# Patient Record
Sex: Female | Born: 1990 | Hispanic: No | Marital: Married | Smoking: Former smoker
Health system: Southern US, Community
[De-identification: ages and names within clinical notes are randomized; demographics above are authoritative.]

## PROBLEM LIST (undated history)

## (undated) DIAGNOSIS — B977 Papillomavirus as the cause of diseases classified elsewhere: Secondary | ICD-10-CM

## (undated) DIAGNOSIS — Z8669 Personal history of other diseases of the nervous system and sense organs: Secondary | ICD-10-CM

## (undated) DIAGNOSIS — D649 Anemia, unspecified: Secondary | ICD-10-CM

## (undated) DIAGNOSIS — K219 Gastro-esophageal reflux disease without esophagitis: Secondary | ICD-10-CM

## (undated) DIAGNOSIS — T7840XA Allergy, unspecified, initial encounter: Secondary | ICD-10-CM

## (undated) HISTORY — PX: TUBAL LIGATION: SHX77

## (undated) HISTORY — PX: WISDOM TOOTH EXTRACTION: SHX21

## (undated) HISTORY — DX: Allergy, unspecified, initial encounter: T78.40XA

## (undated) HISTORY — PX: TONSILLECTOMY: SUR1361

## (undated) HISTORY — DX: Papillomavirus as the cause of diseases classified elsewhere: B97.7

---
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
## 2010-04-10 ENCOUNTER — Ambulatory Visit: Payer: Self-pay | Admitting: Unknown Physician Specialty

## 2010-04-27 ENCOUNTER — Encounter: Payer: Self-pay | Admitting: Orthopedic Surgery

## 2010-05-10 ENCOUNTER — Encounter: Payer: Self-pay | Admitting: Orthopedic Surgery

## 2010-06-09 ENCOUNTER — Encounter: Payer: Self-pay | Admitting: Orthopedic Surgery

## 2011-08-07 ENCOUNTER — Emergency Department: Payer: Self-pay | Admitting: Emergency Medicine

## 2012-12-08 ENCOUNTER — Ambulatory Visit: Payer: Self-pay | Admitting: Family Medicine

## 2013-11-03 ENCOUNTER — Observation Stay: Payer: Self-pay

## 2013-11-03 LAB — URINALYSIS, COMPLETE
Ph: 5 (ref 4.5–8.0)
RBC,UR: 1 /HPF (ref 0–5)
Specific Gravity: 1.018 (ref 1.003–1.030)
Squamous Epithelial: 1

## 2013-11-06 ENCOUNTER — Inpatient Hospital Stay: Payer: Self-pay | Admitting: Obstetrics & Gynecology

## 2013-11-06 LAB — CBC WITH DIFFERENTIAL/PLATELET
Basophil #: 0.1 10*3/uL (ref 0.0–0.1)
Basophil %: 0.6 %
Eosinophil #: 0.4 10*3/uL (ref 0.0–0.7)
Eosinophil %: 3.2 %
Lymphocyte #: 2.2 10*3/uL (ref 1.0–3.6)
Lymphocyte %: 16 %
MCH: 27.8 pg (ref 26.0–34.0)
MCHC: 32.8 g/dL (ref 32.0–36.0)
MCV: 85 fL (ref 80–100)
Neutrophil #: 10.2 10*3/uL — ABNORMAL HIGH (ref 1.4–6.5)
Neutrophil %: 75.7 %
Platelet: 126 10*3/uL — ABNORMAL LOW (ref 150–440)
RDW: 13.7 % (ref 11.5–14.5)
WBC: 13.5 10*3/uL — ABNORMAL HIGH (ref 3.6–11.0)

## 2013-11-06 LAB — GC/CHLAMYDIA PROBE AMP

## 2014-06-18 NOTE — H&P (Signed)
L&D Evaluation:  History:  HPI Pt is a 24 yo G1P0 at [redacted] weeks GA with an EDC of 11/06/13 who presents to L&D with reports of contractions and possible fluid leaking. She reports feeling like her water had broked at 6am. She put a pad on where it was slightly damp. She reports bloody show during this time. The pt reports that her contractions began getting more intense once this happened. Her prenatal course is significant for BMI >30 with a passing early gtt. She is O-, VI, RI, GBS negative, and recieved her Tdap.   Presents with contractions, leaking fluid   Patient's Medical History No Chronic Illness   Patient's Surgical History tonsillectomy   Medications Pre Natal Vitamins   Allergies PCN, amoxicillin   Social History none   Family History Non-Contributory   ROS:  ROS All systems were reviewed.  HEENT, CNS, GI, GU, Respiratory, CV, Renal and Musculoskeletal systems were found to be normal.   Exam:  Vital Signs stable   General no apparent distress   Mental Status clear   Chest clear   Heart normal sinus rhythm   Abdomen gravid, tender with contractions   Pelvic 5.95.-1--2   Mebranes Intact, bag of water palpated   FHT 140's baseline, no decels, no accels at this time. Pt on monitor for 9 minutes at the time of this writing.   Ucx regular, q2   Skin dry, no lesions, no rashes   Lymph no lymphadenopathy   Impression:  Impression active labor, IUP at 2939w0d   Plan:  Plan EFM/NST, iv pain medication, epidural per pt request, anticipate svd.   Follow Up Appointment need to schedule. in 6 weeks   Electronic Signatures: Jannet MantisSubudhi, Ariaunna Longsworth (CNM)  (Signed 29-Sep-15 12:08)  Authored: L&D Evaluation   Last Updated: 29-Sep-15 12:08 by Jannet MantisSubudhi, Lakeisha Waldrop (CNM)

## 2014-06-18 NOTE — H&P (Signed)
L&D Evaluation:  History Expanded:  HPI 24 yo G1 with EDD of 11/06/13 presents with c/o constant back pain and abdominal cramping. Denies LOF, VB or decreased FM. [facility] to [facility]. RH negative - Rhogam given at 28 wks. BMI >30, early 1 hr normal.   Blood Type (Maternal) O negative   Group B Strep Results Maternal (Result >5wks must be treated as unknown) negative   Maternal HIV Negative   Maternal Syphilis Ab Nonreactive   Maternal Varicella Immune   Rubella Results (Maternal) immune   Presents with back pain, contractions   Patient's Medical History No Chronic Illness   Patient's Surgical History tonsillectomy   Medications Pre Natal Vitamins   Allergies PCN, Amoxicillin   Social History none   Exam:  Vital Signs stable   General no apparent distress   Mental Status clear   Abdomen gravid, tender with contractions   Pelvic no external lesions, 2/80/-1, no change in 1 hour   Mebranes Intact   FHT normal rate with no decels, category 1 tracing - baseline 140, moderate variability, + accels, no decels, unchanged x >1 hour   Ucx irregular   Impression:  Impression IUP at 7451w4d, not in labor   Plan:  Plan discharge   Comments Labor precautions and comfort measures   Follow Up Appointment already scheduled. 9/28   Electronic Signatures: Marta AntuBrothers, Akaisha Truman K (CNM)  (Signed 26-Sep-15 17:04)  Authored: L&D Evaluation   Last Updated: 26-Sep-15 17:04 by Vella KohlerBrothers, Armand Preast K (CNM)

## 2014-07-11 ENCOUNTER — Ambulatory Visit (INDEPENDENT_AMBULATORY_CARE_PROVIDER_SITE_OTHER): Payer: BLUE CROSS/BLUE SHIELD | Admitting: Podiatry

## 2014-07-11 ENCOUNTER — Encounter: Payer: Self-pay | Admitting: Podiatry

## 2014-07-11 VITALS — Ht 68.0 in | Wt 200.0 lb

## 2014-07-11 DIAGNOSIS — L600 Ingrowing nail: Secondary | ICD-10-CM | POA: Diagnosis not present

## 2014-07-11 NOTE — Progress Notes (Signed)
   Subjective:    Patient ID: Brenda Villegas, female    DOB: [birth year], 24 y.o.   MRN: [id number]  HPI 24 year old female presents the office they with complaints of painful, recurrent ingrown toenails of bilateral big toes. She states that this has been ongoing for several months. She states that it has worsened recently after she recently had a pedicure and she was vague or digging down to deep to the point where the nail started to bleed. Since then she has had pain to the toenails on the outside borders of both nails. She denies any redness, edema or drainage from the area. She said no prior treatment. No other complaints at this time.   Review of Systems  All other systems reviewed and are negative.      Objective:   Physical Exam AAO x3, NAD DP/PT pulses palpable bilaterally, CRT less than 3 seconds Protective sensation intact with Simms Weinstein monofilament, vibratory sensation intact, Achilles tendon reflex intact There is evidence of incurvation of both the left and right lateral nail borders with tenderness palpation overlying the area. There is mild localized edema and trace erythema directly around the nail border. There is no ascending cellulitis, flexes, crepitus, drainage/purulence, malodor, or any other clinical signs of infection at this time. The remaining nails without pain although they do appear to be somewhat incurvated as well. There is no drainage or redness from the nails. No other areas of tenderness to bilateral lower extremities. MMT 5/5, ROM WNL.  No open lesions or pre-ulcerative lesions.  No overlying edema, erythema, increase in warmth to bilateral lower extremities.  No pain with calf compression, swelling, warmth, erythema bilaterally.     Assessment & Plan:  24 year old female with bilateral lateral hallux symptomatic ingrown toenail -Treatment options discussed including all alternatives, risks, and complications At this time, the patient is  requesting partial nail removal with chemical matricectomy to the symptomatic portion of the nail. Risks and complications were discussed with the patient for which they understand and  verbally consent to the procedure. Under sterile conditions a total of 3 mL of a mixture of 2% lidocaine plain and 0.5% Marcaine plain was infiltrated in a hallux block  bilaterally. Once anesthetized, the skin was prepped in sterile fashion. A tourniquet was then applied. Next the lateral  aspect of hallux nail borders were then sharply excised making sure to remove the entire offending nail border. Once the nails were ensured to be removed area was debrided and the underlying skin was intact. There is no purulence identified in the procedure. Next phenol was then applied under standard conditions and copiously irrigated. Silvadene was applied. A dry sterile dressing was applied. After application of the dressing the tourniquet was removed and there is found to be an immediate capillary refill time to the digit. The patient tolerated the procedure well any complications. Post procedure instructions were discussed the patient for which he verbally understood. Follow-up in one week for nail check or sooner if any problems are to arise. Discussed signs/symptoms of infection and directed to call the office immediately should any occur or go directly to the emergency room. In the meantime, encouraged to call the office with any questions, concerns, changes symptoms.

## 2014-07-11 NOTE — Patient Instructions (Signed)

## 2014-07-25 ENCOUNTER — Ambulatory Visit (INDEPENDENT_AMBULATORY_CARE_PROVIDER_SITE_OTHER): Payer: BLUE CROSS/BLUE SHIELD | Admitting: Podiatry

## 2014-07-25 ENCOUNTER — Telehealth: Payer: Self-pay | Admitting: Physician Assistant

## 2014-07-25 DIAGNOSIS — Z9889 Other specified postprocedural states: Secondary | ICD-10-CM

## 2014-07-25 DIAGNOSIS — L600 Ingrowing nail: Secondary | ICD-10-CM | POA: Diagnosis not present

## 2014-07-25 NOTE — Telephone Encounter (Signed)
Pt called to establish with you.  I have scheduled an appointment.  BCBS.Lurlean Nanny

## 2014-07-26 NOTE — Progress Notes (Signed)
Patient ID: Brenda Villegas, female   DOB: [birth year], 24 y.o.   MRN: [id number]  Subjective: 24 year old female presents the office today for follow up evaluation of bilateral lateral hallux partial nail avulsion with chemical matricectomy. She states that she is doing great and no longer has any pain to the toenails. She been soaking her foot and Epsom salts soaks twice a day covering with antibiotic ointment and a Band-Aid. He denies any purulence or any drainage. Denies any pain to the area. Denies any red streaks. Denies any systemic complaints as fevers, chills, nausea, vomiting. No other complaints at this time in no acute changes since last appointment.  Objective: AAO 3, NAD Neurovascular status unchanged. Status post bilateral lateral hallux partial nail avulsion which is healing well. There is scab formation within the procedure site. There is no tenderness to palpation overlying the area. There is no surrounding erythema, ascending cellulitis, fluctuance, crepitus, drainage/purulence, malodor.  No other areas of tenderness to bilateral lower extremities. No overlying edema, erythema, increased warmth. No open lesions or pre-ulcer lesions identified bilaterally. No pain with calf compression, swelling, warmth, erythema.  Assessment: 24 year old female status post bilateral lateral hallux partial nail avulsions with chemical matricectomy is.  Plan: Recommended continue soaking in Epson salt soaks twice a day covering with antibiotic ointment and a Band-Aid of the day. Can leave the area uncovered at night. Continues to the area has completely healed. Follow-up in 2 weeks if there is not completely healed or sooner if any problems are to arise. Continue to monitor for any clinical signs or symptoms of infection and directed to call the office immediately should any occur. In the meantime call any questions, concerns, or any changes symptoms.

## 2014-08-02 ENCOUNTER — Encounter: Payer: Self-pay | Admitting: Physician Assistant

## 2014-08-02 ENCOUNTER — Ambulatory Visit (INDEPENDENT_AMBULATORY_CARE_PROVIDER_SITE_OTHER): Payer: BLUE CROSS/BLUE SHIELD | Admitting: Physician Assistant

## 2014-08-02 VITALS — BP 112/70 | HR 80 | Temp 98.8°F | Resp 16 | Ht 67.0 in | Wt 220.0 lb

## 2014-08-02 DIAGNOSIS — E0590 Thyrotoxicosis, unspecified without thyrotoxic crisis or storm: Secondary | ICD-10-CM | POA: Diagnosis not present

## 2014-08-02 DIAGNOSIS — Z1331 Encounter for screening for depression: Secondary | ICD-10-CM

## 2014-08-02 DIAGNOSIS — F329 Major depressive disorder, single episode, unspecified: Secondary | ICD-10-CM | POA: Diagnosis not present

## 2014-08-02 DIAGNOSIS — Z1389 Encounter for screening for other disorder: Secondary | ICD-10-CM | POA: Diagnosis not present

## 2014-08-02 DIAGNOSIS — Z0000 Encounter for general adult medical examination without abnormal findings: Secondary | ICD-10-CM | POA: Diagnosis not present

## 2014-08-02 DIAGNOSIS — R5383 Other fatigue: Secondary | ICD-10-CM

## 2014-08-02 DIAGNOSIS — R635 Abnormal weight gain: Secondary | ICD-10-CM

## 2014-08-02 DIAGNOSIS — F32A Depression, unspecified: Secondary | ICD-10-CM

## 2014-08-02 MED ORDER — BUPROPION HCL ER (SR) 100 MG PO TB12: 100.0000 mg | ORAL_TABLET | Freq: Two times a day (BID) | ORAL | Status: DC

## 2014-08-02 NOTE — Patient Instructions (Signed)
American Heart Association (AHA) Exercise Recommendation  Being physically active is important to prevent heart disease and stroke, the nation's No. 1and No. 5killers. To improve overall cardiovascular health, we suggest at least 150 minutes per week of moderate exercise or 75 minutes per week of vigorous exercise (or a combination of moderate and vigorous activity). Thirty minutes a day, five times a week is an easy goal to remember. You will also experience benefits even if you divide your time into two or three segments of 10 to 15 minutes per day.  For people who would benefit from lowering their blood pressure or cholesterol, we recommend 40 minutes of aerobic exercise of moderate to vigorous intensity three to four times a week to lower the risk for heart attack and stroke.  Physical activity is anything that makes you move your body and burn calories.  This includes things like climbing stairs or playing sports. Aerobic exercises benefit your heart, and include walking, jogging, swimming or biking. Strength and stretching exercises are best for overall stamina and flexibility.  The simplest, positive change you can make to effectively improve your heart health is to start walking. It's enjoyable, free, easy, social and great exercise. A walking program is flexible and boasts high success rates because people can stick with it. It's easy for walking to become a regular and satisfying part of life.   For Overall Cardiovascular Health:  At least 30 minutes of moderate-intensity aerobic activity at least 5 days per week for a total of 150  OR   At least 25 minutes of vigorous aerobic activity at least 3 days per week for a total of 75 minutes; or a combination of moderate- and vigorous-intensity aerobic activity  AND   Moderate- to high-intensity muscle-strengthening activity at least 2 days per week for additional health benefits.  For Lowering Blood Pressure and Cholesterol  An  average 40 minutes of moderate- to vigorous-intensity aerobic activity 3 or 4 times per week  What if I can't make it to the time goal? Something is always better than nothing! And everyone has to start somewhere. Even if you've been sedentary for years, today is the day you can begin to make healthy changes in your life. If you don't think you'll make it for 30 or 40 minutes, set a reachable goal for today. You can work up toward your overall goal by increasing your time as you get stronger. Don't let all-or-nothing thinking rob you of doing what you can every day.  Source:http://www.heart.org      Why follow it? Research shows. . Those who follow the Mediterranean diet have a reduced risk of heart disease  . The diet is associated with a reduced incidence of Parkinson's and Alzheimer's diseases . People following the diet may have longer life expectancies and lower rates of chronic diseases  . The Dietary Guidelines for Americans recommends the Mediterranean diet as an eating plan to promote health and prevent disease  What Is the Mediterranean Diet?  . Healthy eating plan based on typical foods and recipes of Mediterranean-style cooking . The diet is primarily a plant based diet; these foods should make up a majority of meals   Starches - Plant based foods should make up a majority of meals - They are an important sources of vitamins, minerals, energy, antioxidants, and fiber - Choose whole grains, foods high in fiber and minimally processed items  - Typical grain sources include wheat, oats, barley, corn, brown rice, bulgar, farro, millet, polenta,  couscous  - Various types of beans include chickpeas, lentils, fava beans, black beans, white beans   Fruits  Veggies - Large quantities of antioxidant rich fruits & veggies; 6 or more servings  - Vegetables can be eaten raw or lightly drizzled with oil and cooked  - Vegetables common to the traditional Mediterranean Diet include: artichokes,  arugula, beets, broccoli, brussel sprouts, cabbage, carrots, celery, collard greens, cucumbers, eggplant, kale, leeks, lemons, lettuce, mushrooms, okra, onions, peas, peppers, potatoes, pumpkin, radishes, rutabaga, shallots, spinach, sweet potatoes, turnips, zucchini - Fruits common to the Mediterranean Diet include: apples, apricots, avocados, cherries, clementines, dates, figs, grapefruits, grapes, melons, nectarines, oranges, peaches, pears, pomegranates, strawberries, tangerines  Fats - Replace butter and margarine with healthy oils, such as olive oil, canola oil, and tahini  - Limit nuts to no more than a handful a day  - Nuts include walnuts, almonds, pecans, pistachios, pine nuts  - Limit or avoid candied, honey roasted or heavily salted nuts - Olives are central to the [employer] - can be eaten whole or used in a variety of dishes   Meats Protein - Limiting red meat: no more than a few times a month - When eating red meat: choose lean cuts and keep the portion to the size of deck of cards - Eggs: approx. 0 to 4 times a week  - Fish and lean poultry: at least 2 a week  - Healthy protein sources include, chicken, [place], lean beef, lamb - Increase intake of seafood such as tuna, salmon, trout, mackerel, shrimp, scallops - Avoid or limit high fat processed meats such as sausage and bacon  Dairy - Include moderate amounts of low fat dairy products  - Focus on healthy dairy such as fat free yogurt, skim milk, low or reduced fat cheese - Limit dairy products higher in fat such as whole or 2% milk, cheese, ice cream  Alcohol - Moderate amounts of red wine is ok  - No more than 5 oz daily for women (all ages) and men older than age 22  - No more than 10 oz of wine daily for men younger than 52  Other - Limit sweets and other desserts  - Use herbs and spices instead of salt to flavor foods  - Herbs and spices common to the traditional Mediterranean Diet include: basil, bay leaves,  chives, cloves, cumin, fennel, garlic, lavender, marjoram, mint, oregano, parsley, pepper, rosemary, sage, savory, sumac, tarragon, thyme   It's not just a diet, it's a lifestyle:  . The Mediterranean diet includes lifestyle factors typical of those in the region  . Foods, drinks and meals are best eaten with others and savored . Daily physical activity is important for overall good health . This could be strenuous exercise like running and aerobics . This could also be more leisurely activities such as walking, housework, yard-work, or taking the stairs . Moderation is the key; a balanced and healthy diet accommodates most foods and drinks . Consider portion sizes and frequency of consumption of certain foods   Meal Ideas & Options:  . Breakfast:  o Whole wheat toast or whole wheat English muffins with peanut butter & hard boiled egg o Steel cut oats topped with apples & cinnamon and skim milk  o Fresh fruit: banana, strawberries, melon, berries, peaches  o Smoothies: strawberries, bananas, greek yogurt, peanut butter o Low fat greek yogurt with blueberries and granola  o Egg white omelet with spinach and mushrooms o Breakfast couscous: whole wheat  couscous, apricots, skim milk, cranberries  . Sandwiches:  o Hummus and grilled vegetables (peppers, zucchini, squash) on whole wheat bread   o Grilled chicken on whole wheat pita with lettuce, tomatoes, cucumbers or tzatziki  o Tuna salad on whole wheat bread: tuna salad made with greek yogurt, olives, red peppers, capers, green onions o Garlic rosemary lamb pita: lamb sauted with garlic, rosemary, salt & pepper; add lettuce, cucumber, greek yogurt to pita - flavor with lemon juice and black pepper  . Seafood:  o Mediterranean grilled salmon, seasoned with garlic, basil, parsley, lemon juice and black pepper o Shrimp, lemon, and spinach whole-grain pasta salad made with low fat greek yogurt  o Seared scallops with lemon orzo  o Seared tuna  steaks seasoned salt, pepper, coriander topped with tomato mixture of olives, tomatoes, olive oil, minced garlic, parsley, green onions and cappers  . Meats:  o Herbed greek chicken salad with kalamata olives, cucumber, feta  o Red bell peppers stuffed with spinach, bulgur, lean ground beef (or lentils) & topped with feta   o Kebabs: skewers of chicken, tomatoes, onions, zucchini, squash  o [place] burgers: made with red onions, mint, dill, lemon juice, feta cheese topped with roasted red peppers . Vegetarian o Cucumber salad: cucumbers, artichoke hearts, celery, red onion, feta cheese, tossed in olive oil & lemon juice  o Hummus and whole grain pita points with a greek salad (lettuce, tomato, feta, olives, cucumbers, red onion) o Lentil soup with celery, carrots made with vegetable broth, garlic, salt and pepper  o Tabouli salad: parsley, bulgur, mint, scallions, cucumbers, tomato, radishes, lemon juice, olive oil, salt and pepper.

## 2014-08-02 NOTE — Progress Notes (Signed)
Subjective:     Patient ID: Brenda Villegas, female   DOB: [birth year], 24 y.o.   MRN: [id number]  Thyroid Problem Presents for initial visit. Symptoms include fatigue. Patient reports no anxiety, diaphoresis, hair loss, leg swelling, menstrual problem or palpitations. Risk factors include family history of hyperthyroidism.  Patient comes in today wanting to get labs checked to see if she has a thryoid issue. She reports that she has a family history and wanted to check to be sure.   Depression: States she has had feelings of depressed mood, irritability towards her husband, and feelings of being a failure since the birth of her son about 9 months ago.  She denies any feelings of hurting herself or the baby or suicidal or homicidal ideations.  She has had episodes of frequent crying and fatigue.  She is currently not taking any medications for depression.  Weight Gain: Has had approx 60 lb weight gain since becoming pregnant.  She is 9 months post partum and has not lost any of the "baby weight."  She does admit to eating a lot and more often than when she even feels like she is not  She will still eat.   Annual: She is not exercising.  She does state that she is sleeping ok, but awakes easily.  She most recently had a pap smear in November prior to her pregnancy.  She has also recently underwent a pelvic exam and breast exam by her OB/Gyn when she had her mirena placed.    Review of Systems  Constitutional: Positive for activity change and fatigue. Negative for fever, chills, diaphoresis, appetite change and unexpected weight change.  HENT: Negative.   Eyes: Negative.   Respiratory: Negative.   Cardiovascular: Negative.  Negative for palpitations.  Gastrointestinal: Negative.   Endocrine: Negative.   Genitourinary: Negative.  Negative for menstrual problem.  Musculoskeletal: Negative.   Skin: Negative.   Allergic/Immunologic: Negative.   Neurological: Negative.   Hematological:  Negative.   Psychiatric/Behavioral: Positive for dysphoric mood and agitation. Negative for suicidal ideas, hallucinations, behavioral problems, confusion, sleep disturbance, self-injury and decreased concentration. The patient is not nervous/anxious and is not hyperactive.        Crying frequently       Objective:   Physical Exam  Constitutional: She is oriented to person, place, and time. She appears well-developed and well-nourished. No distress.  HENT:  Head: Normocephalic and atraumatic.  Right Ear: Hearing, tympanic membrane, external ear and ear canal normal.  Left Ear: Hearing, tympanic membrane, external ear and ear canal normal.  Nose: Nose normal.  Mouth/Throat: Uvula is midline, oropharynx is clear and moist and mucous membranes are normal. No oropharyngeal exudate.  Eyes: Conjunctivae and EOM are normal. Pupils are equal, round, and reactive to light. Right eye exhibits no discharge. Left eye exhibits no discharge. No scleral icterus.  Neck: Normal range of motion. Neck supple. No tracheal deviation present. No thyromegaly present.  Cardiovascular: Normal rate, regular rhythm and normal heart sounds.  Exam reveals no gallop and no friction rub.   No murmur heard. Pulmonary/Chest: Effort normal and breath sounds normal. No respiratory distress. She has no wheezes. She has no rales. She exhibits no tenderness.  Abdominal: Soft. Bowel sounds are normal. She exhibits no distension and no mass. There is no tenderness. There is no rebound and no guarding.  Genitourinary:  Deferred-done by Ob/Gyn when Mirena IUD was placed.  Musculoskeletal: Normal range of motion. She exhibits no edema or tenderness.  Lymphadenopathy:  She has no cervical adenopathy.  Neurological: She is alert and oriented to person, place, and time. No cranial nerve deficit.  Skin: Skin is warm and dry. She is not diaphoretic.  Psychiatric: She has a normal mood and affect. Her behavior is normal. Judgment and  thought content normal.  Vitals reviewed.      Assessment:     1. Annual physical exam   2. Other fatigue   3. Weight gain   4. Depression screening   5. Depression    Plan:     1. Annual physical exam Will check labs.  F/U pending lab results. - CBC w/Diff - Lipid Profile - Comprehensive Metabolic Panel (CMET)  2. Other fatigue Will check thyroid panel since she has strong family history of early onset hypothyroidism.  Will f/u pending lab results. - T4, free - T3, free - TSH  3. Weight gain Discussed weight loss with starting a food diary, limiting calorie intake to 1200-1500 calories per day and adding daily exercise for 3-4 days per week for 30-40 minutes each day. - T4, free - T3, free - TSH  4. Depression screening Scored 12 on PHQ9.  5. Depression Will try wellbutrin as her mother as tried this and it worked well.  It also has the lowest weight gain side effects and possibly a weight loss benefit for her.  Will recheck in 4 weeks to see how the medication is working at that time. - buPROPion (WELLBUTRIN SR) 100 MG 12 hr tablet; Take 1 tablet (100 mg total) by mouth 2 (two) times daily.  Dispense: 30 tablet; Refill: 1

## 2014-08-03 LAB — LIPID PANEL
Cholesterol, Total: 199 mg/dL (ref 100–199)
HDL: 38 mg/dL — ABNORMAL LOW (ref >39)
Triglycerides: 117 mg/dL (ref 0–149)
VLDL Cholesterol Cal: 23 mg/dL (ref 5–40)

## 2014-08-03 LAB — COMPREHENSIVE METABOLIC PANEL
AST: 22 IU/L (ref 0–40)
Alkaline Phosphatase: 129 IU/L — ABNORMAL HIGH (ref 39–117)
BUN/Creatinine Ratio: 14 (ref 8–20)
Bilirubin Total: 0.5 mg/dL (ref 0.0–1.2)
Chloride: 99 mmol/L (ref 97–108)
GFR calc non Af Amer: 137 mL/min/{1.73_m2} (ref >59)
Globulin, Total: 3 g/dL (ref 1.5–4.5)
Potassium: 4.1 mmol/L (ref 3.5–5.2)
Total Protein: 7.3 g/dL (ref 6.0–8.5)

## 2014-08-03 LAB — CBC WITH DIFFERENTIAL/PLATELET
EOS (ABSOLUTE): 0.3 10*3/uL (ref 0.0–0.4)
Eos: 3 %
Hematocrit: 41.9 % (ref 34.0–46.6)
Lymphocytes Absolute: 3.2 10*3/uL — ABNORMAL HIGH (ref 0.7–3.1)
MCH: 26.9 pg (ref 26.6–33.0)
MCHC: 33.4 g/dL (ref 31.5–35.7)
MCV: 81 fL (ref 79–97)
Monocytes Absolute: 0.7 10*3/uL (ref 0.1–0.9)
Neutrophils: 66 %
RBC: 5.2 x10E6/uL (ref 3.77–5.28)
RDW: 13.5 % (ref 12.3–15.4)
WBC: 12 10*3/uL — ABNORMAL HIGH (ref 3.4–10.8)

## 2014-08-03 LAB — T4, FREE: Free T4: 1.87 ng/dL — ABNORMAL HIGH (ref 0.82–1.77)

## 2014-08-03 LAB — TSH: TSH: 0.005 u[IU]/mL — ABNORMAL LOW (ref 0.450–4.500)

## 2014-08-07 ENCOUNTER — Telehealth: Payer: Self-pay

## 2014-08-07 ENCOUNTER — Encounter: Payer: Self-pay | Admitting: Physician Assistant

## 2014-08-07 DIAGNOSIS — E039 Hypothyroidism, unspecified: Secondary | ICD-10-CM | POA: Insufficient documentation

## 2014-08-07 DIAGNOSIS — E0590 Thyrotoxicosis, unspecified without thyrotoxic crisis or storm: Secondary | ICD-10-CM

## 2014-08-07 HISTORY — DX: Thyrotoxicosis, unspecified without thyrotoxic crisis or storm: E05.90

## 2014-08-07 NOTE — Telephone Encounter (Signed)
Pt is returning call.  CB#[phone]/[phone]

## 2014-08-07 NOTE — Addendum Note (Signed)
Addended by: Margaretann LovelessBURNETTE, JENNIFER M on: 08/07/2014 08:25 AM   Modules accepted: Orders

## 2014-08-07 NOTE — Telephone Encounter (Signed)
LMTCB

## 2014-08-07 NOTE — Telephone Encounter (Signed)
-----   Message from Margaretann LovelessJennifer M Burnette, [place]PA-C sent at 08/07/2014  8:28 AM EDT ----- Please notify patient that her labs indicate a possible thyroiditis and hyperthyroid.  I would like to check some more blood work including thyroid antibodies and get a thyroid ultrasound to rule out any nodule that could be causing the hyperthyroidism (orders have been placed) After the ultrasound I will see her back to discuss results and see if referral may be necessary.  Thanks! -JB

## 2014-08-08 NOTE — Telephone Encounter (Signed)
Patient advised as directed below. Patient verbalized understanding and agrees with treatment plan. Patient states she has a appointment for U/S on 08/09/14.

## 2014-08-09 ENCOUNTER — Ambulatory Visit
Admission: RE | Admit: 2014-08-09 | Discharge: 2014-08-09 | Disposition: A | Discharge status: Home / Self Care | Payer: BLUE CROSS/BLUE SHIELD | Source: Ambulatory Visit | Attending: Physician Assistant | Admitting: Physician Assistant

## 2014-08-09 DIAGNOSIS — E0590 Thyrotoxicosis, unspecified without thyrotoxic crisis or storm: Secondary | ICD-10-CM | POA: Insufficient documentation

## 2014-08-13 ENCOUNTER — Telehealth: Payer: Self-pay

## 2014-08-13 NOTE — Telephone Encounter (Signed)
Patient advised as directed below. [facility]. Patient has a follow up appointment scheduled for 08/30/14.

## 2014-08-13 NOTE — Telephone Encounter (Signed)
-----   Message from Margaretann LovelessJennifer M Burnette, [place]PA-C sent at 08/09/2014  5:13 PM EDT ----- No nodules seen, just slightly enlarged thyroid.  Still awaiting antibody labs, but go ahead and set f/u appt with me if not already made.  Thanks! -JB

## 2014-08-14 ENCOUNTER — Telehealth: Payer: Self-pay

## 2014-08-14 LAB — THYROID ANTIBODIES: Thyroglobulin Antibody: 1.5 IU/mL — ABNORMAL HIGH (ref 0.0–0.9)

## 2014-08-14 NOTE — Telephone Encounter (Signed)
LMTCB

## 2014-08-14 NOTE — Telephone Encounter (Signed)
-----   Message from Margaretann LovelessJennifer M Burnette, PA-C sent at 08/14/2014 11:30 AM EDT ----- Thyroid antibodies are elevated.  Recommend referral to endocrinology for further evaluation.  Order for referral has been placed.  Thanks! -JB

## 2014-08-14 NOTE — Addendum Note (Signed)
Addended by: Margaretann LovelessBURNETTE, JENNIFER M on: 08/14/2014 11:30 AM   Modules accepted: Orders

## 2014-08-14 NOTE — Telephone Encounter (Signed)
Patient advised as directed below. Patient verbalized understanding and agrees to proceed with referral.

## 2014-08-30 ENCOUNTER — Ambulatory Visit (INDEPENDENT_AMBULATORY_CARE_PROVIDER_SITE_OTHER): Payer: BLUE CROSS/BLUE SHIELD | Admitting: Physician Assistant

## 2014-08-30 ENCOUNTER — Encounter: Payer: Self-pay | Admitting: Physician Assistant

## 2014-08-30 VITALS — BP 108/70 | HR 72 | Temp 98.1°F | Resp 17 | Wt 223.4 lb

## 2014-08-30 DIAGNOSIS — F32A Depression, unspecified: Secondary | ICD-10-CM

## 2014-08-30 DIAGNOSIS — F329 Major depressive disorder, single episode, unspecified: Secondary | ICD-10-CM | POA: Diagnosis not present

## 2014-08-30 DIAGNOSIS — E0590 Thyrotoxicosis, unspecified without thyrotoxic crisis or storm: Secondary | ICD-10-CM

## 2014-08-30 MED ORDER — BUPROPION HCL ER (SR) 100 MG PO TB12: 100.0000 mg | ORAL_TABLET | Freq: Every day | ORAL | Status: DC

## 2014-08-30 NOTE — Patient Instructions (Signed)
American Heart Association (AHA) Exercise Recommendation  Being physically active is important to prevent heart disease and stroke, the nation's No. 1and No. 5killers. To improve overall cardiovascular health, we suggest at least 150 minutes per week of moderate exercise or 75 minutes per week of vigorous exercise (or a combination of moderate and vigorous activity). Thirty minutes a day, five times a week is an easy goal to remember. You will also experience benefits even if you divide your time into two or three segments of 10 to 15 minutes per day.  For people who would benefit from lowering their blood pressure or cholesterol, we recommend 40 minutes of aerobic exercise of moderate to vigorous intensity three to four times a week to lower the risk for heart attack and stroke.  Physical activity is anything that makes you move your body and burn calories.  This includes things like climbing stairs or playing sports. Aerobic exercises benefit your heart, and include walking, jogging, swimming or biking. Strength and stretching exercises are best for overall stamina and flexibility.  The simplest, positive change you can make to effectively improve your heart health is to start walking. It's enjoyable, free, easy, social and great exercise. A walking program is flexible and boasts high success rates because people can stick with it. It's easy for walking to become a regular and satisfying part of life.   For Overall Cardiovascular Health:  At least 30 minutes of moderate-intensity aerobic activity at least 5 days per week for a total of 150  OR   At least 25 minutes of vigorous aerobic activity at least 3 days per week for a total of 75 minutes; or a combination of moderate- and vigorous-intensity aerobic activity  AND   Moderate- to high-intensity muscle-strengthening activity at least 2 days per week for additional health benefits.  For Lowering Blood Pressure and Cholesterol  An  average 40 minutes of moderate- to vigorous-intensity aerobic activity 3 or 4 times per week  What if I can't make it to the time goal? Something is always better than nothing! And everyone has to start somewhere. Even if you've been sedentary for years, today is the day you can begin to make healthy changes in your life. If you don't think you'll make it for 30 or 40 minutes, set a reachable goal for today. You can work up toward your overall goal by increasing your time as you get stronger. Don't let all-or-nothing thinking rob you of doing what you can every day.  Source:http://www.heart.org      Why follow it? Research shows. . Those who follow the Mediterranean diet have a reduced risk of heart disease  . The diet is associated with a reduced incidence of Parkinson's and Alzheimer's diseases . People following the diet may have longer life expectancies and lower rates of chronic diseases  . The Dietary Guidelines for Americans recommends the Mediterranean diet as an eating plan to promote health and prevent disease  What Is the Mediterranean Diet?  . Healthy eating plan based on typical foods and recipes of Mediterranean-style cooking . The diet is primarily a plant based diet; these foods should make up a majority of meals   Starches - Plant based foods should make up a majority of meals - They are an important sources of vitamins, minerals, energy, antioxidants, and fiber - Choose whole grains, foods high in fiber and minimally processed items  - Typical grain sources include wheat, oats, barley, corn, brown rice, bulgar, farro, millet, polenta,  couscous  - Various types of beans include chickpeas, lentils, fava beans, black beans, white beans   Fruits  Veggies - Large quantities of antioxidant rich fruits & veggies; 6 or more servings  - Vegetables can be eaten raw or lightly drizzled with oil and cooked  - Vegetables common to the traditional Mediterranean Diet include: artichokes,  arugula, beets, broccoli, brussel sprouts, cabbage, carrots, celery, collard greens, cucumbers, eggplant, kale, leeks, lemons, lettuce, mushrooms, okra, onions, peas, peppers, potatoes, pumpkin, radishes, rutabaga, shallots, spinach, sweet potatoes, turnips, zucchini - Fruits common to the Mediterranean Diet include: apples, apricots, avocados, cherries, clementines, dates, figs, grapefruits, grapes, melons, nectarines, oranges, peaches, pears, pomegranates, strawberries, tangerines  Fats - Replace butter and margarine with healthy oils, such as olive oil, canola oil, and tahini  - Limit nuts to no more than a handful a day  - Nuts include walnuts, almonds, pecans, pistachios, pine nuts  - Limit or avoid candied, honey roasted or heavily salted nuts - Olives are central to the [employer] - can be eaten whole or used in a variety of dishes   Meats Protein - Limiting red meat: no more than a few times a month - When eating red meat: choose lean cuts and keep the portion to the size of deck of cards - Eggs: approx. 0 to 4 times a week  - Fish and lean poultry: at least 2 a week  - Healthy protein sources include, chicken, [place], lean beef, lamb - Increase intake of seafood such as tuna, salmon, trout, mackerel, shrimp, scallops - Avoid or limit high fat processed meats such as sausage and bacon  Dairy - Include moderate amounts of low fat dairy products  - Focus on healthy dairy such as fat free yogurt, skim milk, low or reduced fat cheese - Limit dairy products higher in fat such as whole or 2% milk, cheese, ice cream  Alcohol - Moderate amounts of red wine is ok  - No more than 5 oz daily for women (all ages) and men older than age 22  - No more than 10 oz of wine daily for men younger than 52  Other - Limit sweets and other desserts  - Use herbs and spices instead of salt to flavor foods  - Herbs and spices common to the traditional Mediterranean Diet include: basil, bay leaves,  chives, cloves, cumin, fennel, garlic, lavender, marjoram, mint, oregano, parsley, pepper, rosemary, sage, savory, sumac, tarragon, thyme   It's not just a diet, it's a lifestyle:  . The Mediterranean diet includes lifestyle factors typical of those in the region  . Foods, drinks and meals are best eaten with others and savored . Daily physical activity is important for overall good health . This could be strenuous exercise like running and aerobics . This could also be more leisurely activities such as walking, housework, yard-work, or taking the stairs . Moderation is the key; a balanced and healthy diet accommodates most foods and drinks . Consider portion sizes and frequency of consumption of certain foods   Meal Ideas & Options:  . Breakfast:  o Whole wheat toast or whole wheat English muffins with peanut butter & hard boiled egg o Steel cut oats topped with apples & cinnamon and skim milk  o Fresh fruit: banana, strawberries, melon, berries, peaches  o Smoothies: strawberries, bananas, greek yogurt, peanut butter o Low fat greek yogurt with blueberries and granola  o Egg white omelet with spinach and mushrooms o Breakfast couscous: whole wheat  couscous, apricots, skim milk, cranberries  . Sandwiches:  o Hummus and grilled vegetables (peppers, zucchini, squash) on whole wheat bread   o Grilled chicken on whole wheat pita with lettuce, tomatoes, cucumbers or tzatziki  o Tuna salad on whole wheat bread: tuna salad made with greek yogurt, olives, red peppers, capers, green onions o Garlic rosemary lamb pita: lamb sauted with garlic, rosemary, salt & pepper; add lettuce, cucumber, greek yogurt to pita - flavor with lemon juice and black pepper  . Seafood:  o Mediterranean grilled salmon, seasoned with garlic, basil, parsley, lemon juice and black pepper o Shrimp, lemon, and spinach whole-grain pasta salad made with low fat greek yogurt  o Seared scallops with lemon orzo  o Seared tuna  steaks seasoned salt, pepper, coriander topped with tomato mixture of olives, tomatoes, olive oil, minced garlic, parsley, green onions and cappers  . Meats:  o Herbed greek chicken salad with kalamata olives, cucumber, feta  o Red bell peppers stuffed with spinach, bulgur, lean ground beef (or lentils) & topped with feta   o Kebabs: skewers of chicken, tomatoes, onions, zucchini, squash  o Turkey burgers: made with red onions, mint, dill, lemon juice, feta cheese topped with roasted red peppers . Vegetarian o Cucumber salad: cucumbers, artichoke hearts, celery, red onion, feta cheese, tossed in olive oil & lemon juice  o Hummus and whole grain pita points with a greek salad (lettuce, tomato, feta, olives, cucumbers, red onion) o Lentil soup with celery, carrots made with vegetable broth, garlic, salt and pepper  o Tabouli salad: parsley, bulgur, mint, scallions, cucumbers, tomato, radishes, lemon juice, olive oil, salt and pepper.      

## 2014-08-30 NOTE — Progress Notes (Signed)
Patient: Brenda Villegas Female    DOB: [birth year]   24 y.o.   MRN: [id number] Visit Date: 08/30/2014  Today's Provider: Margaretann Loveless, PA-C   Chief Complaint  Patient presents with  . Follow-up    Depression; Wellbutrion med   Subjective:    HPI Brenda Villegas is a 24 year old female that returns to the office today to follow-up her depression symptoms that began after the birth of her son. She does feel that she is doing better and has not had as many mood swings as she used to. At previous visit she was also found to have a hyperthyroid and was sent to endocrinology for further evaluation. It was found that she most likely had Graves' disease and is being treated with methimazole for the hyperthyroid at this time. She follows back up with endocrinology at the end of August 2016.    Allergies  Allergen Reactions  . Penicillins Hives   Previous Medications   BUPROPION (WELLBUTRIN SR) 100 MG 12 HR TABLET    Take 1 tablet (100 mg total) by mouth 2 (two) times daily.   METHIMAZOLE (TAPAZOLE) 10 MG TABLET    Take by mouth.    Review of Systems  Constitutional: Negative for fatigue.  Respiratory: Negative for chest tightness and shortness of breath.   Cardiovascular: Negative for chest pain and palpitations.  Neurological: Negative for dizziness, syncope, weakness, light-headedness, numbness and headaches.  Psychiatric/Behavioral: Negative for suicidal ideas, hallucinations, behavioral problems, confusion, sleep disturbance, self-injury, dysphoric mood, decreased concentration and agitation. The patient is not nervous/anxious and is not hyperactive.     History  Substance Use Topics  . Smoking status: Never Smoker   . Smokeless tobacco: Never Used  . Alcohol Use: No   Objective:   BP 108/70 mmHg  Pulse 72  Temp(Src) 98.1 F (36.7 C) (Oral)  Resp 17  Wt 223 lb 6.4 oz (101.334 kg)  Physical Exam  Constitutional: She appears well-developed and  well-nourished. No distress.  Cardiovascular: Normal rate, regular rhythm and normal heart sounds.  Exam reveals no gallop and no friction rub.   No murmur heard. Pulmonary/Chest: Effort normal and breath sounds normal. No respiratory distress. She has no wheezes. She has no rales.  Neurological: She is alert.  Skin: She is not diaphoretic.  Psychiatric: She has a normal mood and affect. Her behavior is normal. Judgment and thought content normal.  Vitals reviewed.       Assessment & Plan:     1. Depression She has been taking only 100 mg Wellbutrin daily and feels that this has been controlling her symptoms well. We will continue at this dose and follow-up in 3 months following her follow-up appointment with endocrinology. - buPROPion (WELLBUTRIN SR) 100 MG 12 hr tablet; Take 1 tablet (100 mg total) by mouth daily.  Dispense: 30 tablet; Refill: 3  2. Hyperthyroidism Lab workup revealed possible Graves' disease with elevated thyroid antibodies. She was seen by endocrinology and started on methimazole. She does feel the methimazole is helping her symptoms. She is to follow-up with endocrinology at the end of August 2016.  She is interested in continuing to lose weight. She has noticed with starting the methimazole that she does not eat as much as she used to. It is with hopes that controlling her hyperthyroidism will decrease her appetite and with the use of the Wellbutrin that she will be able to lose some weight. Information was given on diet and exercise  to help her.        Margaretann Loveless, PA-C  [facility]

## 2014-11-29 ENCOUNTER — Encounter: Payer: Self-pay | Admitting: Physician Assistant

## 2014-11-29 ENCOUNTER — Ambulatory Visit (INDEPENDENT_AMBULATORY_CARE_PROVIDER_SITE_OTHER): Payer: BLUE CROSS/BLUE SHIELD | Admitting: Physician Assistant

## 2014-11-29 VITALS — BP 114/78 | HR 72 | Temp 98.0°F | Resp 16 | Wt 232.0 lb

## 2014-11-29 DIAGNOSIS — Z713 Dietary counseling and surveillance: Secondary | ICD-10-CM | POA: Diagnosis not present

## 2014-11-29 DIAGNOSIS — F329 Major depressive disorder, single episode, unspecified: Secondary | ICD-10-CM

## 2014-11-29 DIAGNOSIS — F32A Depression, unspecified: Secondary | ICD-10-CM

## 2014-11-29 DIAGNOSIS — E0590 Thyrotoxicosis, unspecified without thyrotoxic crisis or storm: Secondary | ICD-10-CM | POA: Diagnosis not present

## 2014-11-29 NOTE — Patient Instructions (Signed)
Calorie Counting for Weight Loss Calories are energy you get from the things you eat and drink. Your body uses this energy to keep you going throughout the day. The number of calories you eat affects your weight. When you eat more calories than your body needs, your body stores the extra calories as fat. When you eat fewer calories than your body needs, your body burns fat to get the energy it needs. Calorie counting means keeping track of how many calories you eat and drink each day. If you make sure to eat fewer calories than your body needs, you should lose weight. In order for calorie counting to work, you will need to eat the number of calories that are right for you in a day to lose a healthy amount of weight per week. A healthy amount of weight to lose per week is usually 1-2 lb (0.5-0.9 kg). A dietitian can determine how many calories you need in a day and give you suggestions on how to reach your calorie goal.  WHAT IS MY MY PLAN? My goal is to have 1200-1500 calories per day.  If I have this many calories per day, I should lose around 1-2 pounds per week. WHAT DO I NEED TO KNOW ABOUT CALORIE COUNTING? In order to meet your daily calorie goal, you will need to:  Find out how many calories are in each food you would like to eat. Try to do this before you eat.  Decide how much of the food you can eat.  Write down what you ate and how many calories it had. Doing this is called keeping a food log. WHERE DO I FIND CALORIE INFORMATION? The number of calories in a food can be found on a Nutrition Facts label. Note that all the information on a label is based on a specific serving of the food. If a food does not have a Nutrition Facts label, try to look up the calories online or ask your dietitian for help. HOW DO I DECIDE HOW MUCH TO EAT? To decide how much of the food you can eat, you will need to consider both the number of calories in one serving and the size of one serving. This information  can be found on the Nutrition Facts label. If a food does not have a Nutrition Facts label, look up the information online or ask your dietitian for help. Remember that calories are listed per serving. If you choose to have more than one serving of a food, you will have to multiply the calories per serving by the amount of servings you plan to eat. For example, the label on a package of bread might say that a serving size is 1 slice and that there are 90 calories in a serving. If you eat 1 slice, you will have eaten 90 calories. If you eat 2 slices, you will have eaten 180 calories. HOW DO I KEEP A FOOD LOG? After each meal, record the following information in your food log:  What you ate.  How much of it you ate.  How many calories it had.  Then, add up your calories. Keep your food log near you, such as in a small notebook in your pocket. Another option is to use a mobile app or website. Some programs will calculate calories for you and show you how many calories you have left each time you add an item to the log. WHAT ARE SOME CALORIE COUNTING TIPS?  Use your calories on foods  and drinks that will fill you up and not leave you hungry. Some examples of this include foods like nuts and nut butters, vegetables, lean proteins, and high-fiber foods (more than 5 g fiber per serving).  Eat nutritious foods and avoid empty calories. Empty calories are calories you get from foods or beverages that do not have many nutrients, such as candy and soda. It is better to have a nutritious high-calorie food (such as an avocado) than a food with few nutrients (such as a bag of chips).  Know how many calories are in the foods you eat most often. This way, you do not have to look up how many calories they have each time you eat them.  Look out for foods that may seem like low-calorie foods but are really high-calorie foods, such as baked goods, soda, and fat-free candy.  Pay attention to calories in drinks.  Drinks such as sodas, specialty coffee drinks, alcohol, and juices have a lot of calories yet do not fill you up. Choose low-calorie drinks like water and diet drinks.  Focus your calorie counting efforts on higher calorie items. Logging the calories in a garden salad that contains only vegetables is less important than calculating the calories in a milk shake.  Find a way of tracking calories that works for you. Get creative. Most people who are successful find ways to keep track of how much they eat in a day, even if they do not count every calorie. WHAT ARE SOME PORTION CONTROL TIPS?  Know how many calories are in a serving. This will help you know how many servings of a certain food you can have.  Use a measuring cup to measure serving sizes. This is helpful when you start out. With time, you will be able to estimate serving sizes for some foods.  Take some time to put servings of different foods on your favorite plates, bowls, and cups so you know what a serving looks like.  Try not to eat straight from a bag or box. Doing this can lead to overeating. Put the amount you would like to eat in a cup or on a plate to make sure you are eating the right portion.  Use smaller plates, glasses, and bowls to prevent overeating. This is a quick and easy way to practice portion control. If your plate is smaller, less food can fit on it.  Try not to multitask while eating, such as watching TV or using your computer. If it is time to eat, sit down at a table and enjoy your food. Doing this will help you to start recognizing when you are full. It will also make you more aware of what and how much you are eating. HOW CAN I CALORIE COUNT WHEN EATING OUT?  Ask for smaller portion sizes or child-sized portions.  Consider sharing an entree and sides instead of getting your own entree.  If you get your own entree, eat only half. Ask for a box at the beginning of your meal and put the rest of your entree in  it so you are not tempted to eat it.  Look for the calories on the menu. If calories are listed, choose the lower calorie options.  Choose dishes that include vegetables, fruits, whole grains, low-fat dairy products, and lean protein. Focusing on smart food choices from each of the 5 food groups can help you stay on track at restaurants.  Choose items that are boiled, broiled, grilled, or steamed.  Choose  water, milk, unsweetened iced tea, or other drinks without added sugars. If you want an alcoholic beverage, choose a lower calorie option. For example, a regular margarita can have up to 700 calories and a glass of wine has around 150.  Stay away from items that are buttered, battered, fried, or served with cream sauce. Items labeled "crispy" are usually fried, unless stated otherwise.  Ask for dressings, sauces, and syrups on the side. These are usually very high in calories, so do not eat much of them.  Watch out for salads. Many people think salads are a healthy option, but this is often not the case. Many salads come with bacon, fried chicken, lots of cheese, fried chips, and dressing. All of these items have a lot of calories. If you want a salad, choose a garden salad and ask for grilled meats or steak. Ask for the dressing on the side, or ask for olive oil and vinegar or lemon to use as dressing.  Estimate how many servings of a food you are given. For example, a serving of cooked rice is  cup or about the size of half a tennis ball or one cupcake wrapper. Knowing serving sizes will help you be aware of how much food you are eating at restaurants. The list below tells you how big or small some common portion sizes are based on everyday objects.  1 oz--4 stacked dice.  3 oz--1 deck of cards.  1 tsp--1 dice.  1 Tbsp-- a Ping-Pong ball.  2 Tbsp--1 Ping-Pong ball.   cup--1 tennis ball or 1 cupcake wrapper.  1 cup--1 baseball.   This information is not intended to replace advice  given to you by your health care provider. Make sure you discuss any questions you have with your health care provider.   Document Released: 01/25/2005 Document Revised: 02/15/2014 Document Reviewed: 11/30/2012 Elsevier Interactive Patient Education [employer]    Why follow it? Research shows. . Those who follow the Mediterranean diet have a reduced risk of heart disease  . The diet is associated with a reduced incidence of Parkinson's and Alzheimer's diseases . People following the diet may have longer life expectancies and lower rates of chronic diseases  . The Dietary Guidelines for Americans recommends the Mediterranean diet as an eating plan to promote health and prevent disease  What Is the Mediterranean Diet?  . Healthy eating plan based on typical foods and recipes of Mediterranean-style cooking . The diet is primarily a plant based diet; these foods should make up a majority of meals   Starches - Plant based foods should make up a majority of meals - They are an important sources of vitamins, minerals, energy, antioxidants, and fiber - Choose whole grains, foods high in fiber and minimally processed items  - Typical grain sources include wheat, oats, barley, corn, brown rice, bulgar, farro, millet, polenta, couscous  - Various types of beans include chickpeas, lentils, fava beans, black beans, white beans   Fruits  Veggies - Large quantities of antioxidant rich fruits & veggies; 6 or more servings  - Vegetables can be eaten raw or lightly drizzled with oil and cooked  - Vegetables common to the traditional Mediterranean Diet include: artichokes, arugula, beets, broccoli, brussel sprouts, cabbage, carrots, celery, collard greens, cucumbers, eggplant, kale, leeks, lemons, lettuce, mushrooms, okra, onions, peas, peppers, potatoes, pumpkin, radishes, rutabaga, shallots, spinach, sweet potatoes, turnips, zucchini - Fruits common to the Mediterranean Diet include: apples, apricots,  avocados, cherries, clementines, dates, figs, grapefruits,  grapes, melons, nectarines, oranges, peaches, pears, pomegranates, strawberries, tangerines  Fats - Replace butter and margarine with healthy oils, such as olive oil, canola oil, and tahini  - Limit nuts to no more than a handful a day  - Nuts include walnuts, almonds, pecans, pistachios, pine nuts  - Limit or avoid candied, honey roasted or heavily salted nuts - Olives are central to the Mediterranean diet - can be eaten whole or used in a variety of dishes   Meats Protein - Limiting red meat: no more than a few times a month - When eating red meat: choose lean cuts and keep the portion to the size of deck of cards - Eggs: approx. 0 to 4 times a week  - Fish and lean poultry: at least 2 a week  - Healthy protein sources include, chicken, [place], lean beef, lamb - Increase intake of seafood such as tuna, salmon, trout, mackerel, shrimp, scallops - Avoid or limit high fat processed meats such as sausage and bacon  Dairy - Include moderate amounts of low fat dairy products  - Focus on healthy dairy such as fat free yogurt, skim milk, low or reduced fat cheese - Limit dairy products higher in fat such as whole or 2% milk, cheese, ice cream  Alcohol - Moderate amounts of red wine is ok  - No more than 5 oz daily for women (all ages) and men older than age 25  - No more than 10 oz of wine daily for men younger than 69  Other - Limit sweets and other desserts  - Use herbs and spices instead of salt to flavor foods  - Herbs and spices common to the traditional Mediterranean Diet include: basil, bay leaves, chives, cloves, cumin, fennel, garlic, lavender, marjoram, mint, oregano, parsley, pepper, rosemary, sage, savory, sumac, tarragon, thyme   It's not just a diet, it's a lifestyle:  . The Mediterranean diet includes lifestyle factors typical of those in the region  . Foods, drinks and meals are best eaten with others and savored . Daily  physical activity is important for overall good health . This could be strenuous exercise like running and aerobics . This could also be more leisurely activities such as walking, housework, yard-work, or taking the stairs . Moderation is the key; a balanced and healthy diet accommodates most foods and drinks . Consider portion sizes and frequency of consumption of certain foods   Meal Ideas & Options:  . Breakfast:  o Whole wheat toast or whole wheat English muffins with peanut butter & hard boiled egg o Steel cut oats topped with apples & cinnamon and skim milk  o Fresh fruit: banana, strawberries, melon, berries, peaches  o Smoothies: strawberries, bananas, greek yogurt, peanut butter o Low fat greek yogurt with blueberries and granola  o Egg white omelet with spinach and mushrooms o Breakfast couscous: whole wheat couscous, apricots, skim milk, cranberries  . Sandwiches:  o Hummus and grilled vegetables (peppers, zucchini, squash) on whole wheat bread   o Grilled chicken on whole wheat pita with lettuce, tomatoes, cucumbers or tzatziki  o Tuna salad on whole wheat bread: tuna salad made with greek yogurt, olives, red peppers, capers, green onions o Garlic rosemary lamb pita: lamb sauted with garlic, rosemary, salt & pepper; add lettuce, cucumber, greek yogurt to pita - flavor with lemon juice and black pepper  . Seafood:  o Mediterranean grilled salmon, seasoned with garlic, basil, parsley, lemon juice and black pepper o Shrimp, lemon, and spinach  whole-grain pasta salad made with low fat greek yogurt  o Seared scallops with lemon orzo  o Seared tuna steaks seasoned salt, pepper, coriander topped with tomato mixture of olives, tomatoes, olive oil, minced garlic, parsley, green onions and cappers  . Meats:  o Herbed greek chicken salad with kalamata olives, cucumber, feta  o Red bell peppers stuffed with spinach, bulgur, lean ground beef (or lentils) & topped with feta   o Kebabs:  skewers of chicken, tomatoes, onions, zucchini, squash  o [place] burgers: made with red onions, mint, dill, lemon juice, feta cheese topped with roasted red peppers . Vegetarian o Cucumber salad: cucumbers, artichoke hearts, celery, red onion, feta cheese, tossed in olive oil & lemon juice  o Hummus and whole grain pita points with a greek salad (lettuce, tomato, feta, olives, cucumbers, red onion) o Lentil soup with celery, carrots made with vegetable broth, garlic, salt and pepper  o Tabouli salad: parsley, bulgur, mint, scallions, cucumbers, tomato, radishes, lemon juice, olive oil, salt and pepper.

## 2014-11-29 NOTE — Progress Notes (Signed)
Patient: Brenda Villegas V Singleton Female    DOB: 03/29/1990   24 y.o.   MRN: [id number] Visit Date: 11/29/2014  Today's Provider: Margaretann LovelessJennifer M Burnette, PA-C   Chief Complaint  Patient presents with  . Depression  . Hyperthyroidism   Subjective:    HPI Depression: Patient is here for her 3 month follow up. She states feeling good. She denies current suicidal and homicidal plan or intent.   Family history significant for depression.Treatment includes Wellbutrin as needed, she has not been taking the medicine since last office visit. She complains of the following side effects from the treatment: none.   Hyperthyroidism: Patient followed up with her Edocrinologist on 09/2014, per patient the specialist stated patient was doing pretty well. Lower the Methimazole to 5 mg a day since then patient has been taking half of the 10 mg.  Weight loss: She states that she is still interested in trying to lose weight. Her and her husband have been trying to follow a healthy diet and increased physical activity. She states that last week her husband did have to undergo surgical removal of his thyroid gland for thyroid cancer and they have not been eating as healthy over the last week and has not been exercising. She states she has gone on small short walks with her son but she cannot go very far with him. They have joined a gym and are going to start following act to the gym once her husband heals from his surgery. She is also tried some natural supplements with a company called [employer] that did help some. She is currently not using anything else for weight loss.  Patient wants to wait on the Influenza Vaccine. Allergies  Allergen Reactions  . Penicillins Hives   Previous Medications   BUPROPION (WELLBUTRIN SR) 100 MG 12 HR TABLET    Take 1 tablet (100 mg total) by mouth daily.   METHIMAZOLE (TAPAZOLE) 10 MG TABLET    Take by mouth.   METHIMAZOLE (TAPAZOLE) 10 MG TABLET        Review of Systems    Constitutional: Negative.   HENT: Positive for rhinorrhea. Negative for congestion, ear discharge, ear pain, sinus pressure, sneezing and sore throat.   Eyes: Negative for discharge, itching and visual disturbance.  Respiratory: Negative for cough, chest tightness, shortness of breath and wheezing.   Cardiovascular: Negative for chest pain, palpitations and leg swelling.  Gastrointestinal: Negative for nausea and vomiting.  Endocrine: Negative.   Psychiatric/Behavioral: Negative for sleep disturbance and decreased concentration. The patient is not nervous/anxious.     Social History  Substance Use Topics  . Smoking status: Never Smoker   . Smokeless tobacco: Never Used  . Alcohol Use: No   Objective:   BP 114/78 mmHg  Pulse 72  Temp(Src) 98 F (36.7 C) (Oral)  Resp 16  Wt 232 lb (105.235 kg)  Physical Exam  Constitutional: She appears well-developed and well-nourished. No distress.  Neck: Normal range of motion. Neck supple. No tracheal deviation present. No thyromegaly present.  Cardiovascular: Normal rate, regular rhythm and normal heart sounds.  Exam reveals no gallop and no friction rub.   No murmur heard. Pulmonary/Chest: Effort normal and breath sounds normal. No respiratory distress. She has no wheezes. She has no rales.  Lymphadenopathy:    She has no cervical adenopathy.  Skin: She is not diaphoretic.  Vitals reviewed.       Assessment & Plan:     1. Encounter  for weight loss counseling Discussed in detail different diet options. We decided that for her and her husband will probably be best to do calorie counting. She will start to keep a food diary to help with her calorie counting. She states that she has done this before and this is what worked well for her. I advised her to stick to a 1200-1500-calorie diet and to try to exercise approximately 30-45 minutes 4-5 times a week. She voiced understanding and will try this. I did give her information about calorie  counting as well as about the Mediterranean diet. We did discuss the Mediterranean diet and how it may be a good option for her and her husband. I did advise her, however, to exchange the fish out for chicken as they do not like to eat fish. I will see her back in 6 months to see how she is doing on her weight loss journey. She may call the office if she would like any further assistance or if she has any acute issues in the meantime.  2. Depression She is doing very well. She has not had to take her Wellbutrin. She feels that her depression was most likely secondary to her thyroid issues. Since she is now stable on the methimazole she is doing well and has not had any depression symptoms. I will follow-up with her in 6 months or sooner if needed.  3. Hyperthyroidism Stable. She is followed by endocrinology. She is currently on methimazole 10 mg and will soon go to methimazole 5 mg. She does follow-up with endocrinology later this year. She could not remember the exact date. I will follow-up with her in 6 months and see how she is doing at that time.  I spent approximately 45 minutes with the patient today. Over 50% of this time was spent with counseling and educating the patient on weight loss, dieting and exercise.        Margaretann Loveless, PA-C  [facility]

## 2015-05-30 ENCOUNTER — Ambulatory Visit: Payer: BLUE CROSS/BLUE SHIELD | Admitting: Physician Assistant

## 2015-05-31 DIAGNOSIS — H5202 Hypermetropia, left eye: Secondary | ICD-10-CM | POA: Diagnosis not present

## 2015-05-31 DIAGNOSIS — H5203 Hypermetropia, bilateral: Secondary | ICD-10-CM | POA: Diagnosis not present

## 2015-05-31 DIAGNOSIS — H5201 Hypermetropia, right eye: Secondary | ICD-10-CM | POA: Diagnosis not present

## 2015-05-31 DIAGNOSIS — H52222 Regular astigmatism, left eye: Secondary | ICD-10-CM | POA: Diagnosis not present

## 2015-06-02 ENCOUNTER — Ambulatory Visit: Payer: BLUE CROSS/BLUE SHIELD | Admitting: Physician Assistant

## 2015-07-15 DIAGNOSIS — Z124 Encounter for screening for malignant neoplasm of cervix: Secondary | ICD-10-CM | POA: Diagnosis not present

## 2015-07-15 DIAGNOSIS — Z309 Encounter for contraceptive management, unspecified: Secondary | ICD-10-CM | POA: Diagnosis not present

## 2015-07-15 DIAGNOSIS — Z01419 Encounter for gynecological examination (general) (routine) without abnormal findings: Secondary | ICD-10-CM | POA: Diagnosis not present

## 2015-07-15 DIAGNOSIS — Z30431 Encounter for routine checking of intrauterine contraceptive device: Secondary | ICD-10-CM | POA: Diagnosis not present

## 2015-09-04 DIAGNOSIS — E0500 Thyrotoxicosis with diffuse goiter without thyrotoxic crisis or storm: Secondary | ICD-10-CM | POA: Diagnosis not present

## 2015-10-06 DIAGNOSIS — Z1239 Encounter for other screening for malignant neoplasm of breast: Secondary | ICD-10-CM | POA: Diagnosis not present

## 2015-10-06 DIAGNOSIS — R2231 Localized swelling, mass and lump, right upper limb: Secondary | ICD-10-CM | POA: Diagnosis not present

## 2015-10-23 DIAGNOSIS — J069 Acute upper respiratory infection, unspecified: Secondary | ICD-10-CM | POA: Diagnosis not present

## 2016-07-20 ENCOUNTER — Ambulatory Visit (INDEPENDENT_AMBULATORY_CARE_PROVIDER_SITE_OTHER): Payer: BLUE CROSS/BLUE SHIELD | Admitting: Obstetrics and Gynecology

## 2016-07-20 ENCOUNTER — Encounter: Payer: Self-pay | Admitting: Obstetrics and Gynecology

## 2016-07-20 VITALS — BP 118/74 | Ht 68.0 in | Wt 224.0 lb

## 2016-07-20 DIAGNOSIS — N3000 Acute cystitis without hematuria: Secondary | ICD-10-CM

## 2016-07-20 DIAGNOSIS — Z124 Encounter for screening for malignant neoplasm of cervix: Secondary | ICD-10-CM

## 2016-07-20 DIAGNOSIS — R3915 Urgency of urination: Secondary | ICD-10-CM

## 2016-07-20 DIAGNOSIS — Z01419 Encounter for gynecological examination (general) (routine) without abnormal findings: Secondary | ICD-10-CM | POA: Diagnosis not present

## 2016-07-20 DIAGNOSIS — Z30431 Encounter for routine checking of intrauterine contraceptive device: Secondary | ICD-10-CM

## 2016-07-20 LAB — POCT URINALYSIS DIPSTICK
Blood, UA: NEGATIVE
Nitrite, UA: NEGATIVE
Protein, UA: NEGATIVE
Spec Grav, UA: <=1.005 (ref 1.010–1.025)
Urobilinogen, UA: NEGATIVE E.U./dL
pH, UA: 6 (ref 5.0–8.0)

## 2016-07-20 MED ORDER — NITROFURANTOIN MONOHYD MACRO 100 MG PO CAPS: 100.0000 mg | ORAL_CAPSULE | Freq: Two times a day (BID) | ORAL | 0 refills | Status: DC

## 2016-07-20 NOTE — Progress Notes (Signed)
Chief Complaint  Patient presents with  . Annual Exam     HPI:      Brenda Villegas is a 26 y.o. G1P1001 who LMP was No LMP recorded. Patient is not currently having periods (Reason: IUD)., presents today for her annual examination.  Her menses are absent due to the IUD.  Dysmenorrhea mild, occurring premenstrually. She does not have intermenstrual bleeding.  Sex activity: single partner, contraception - IUD. Mirena placed 01/07/14 Last Pap: July 15, 2015  Results were: no abnormalities  Hx of STDs: none  There is a FH of breast cancer in her pat aunt and MGGM, genetic testing not indicated. There is no FH of ovarian cancer. The patient does not do self-breast exams.  Tobacco use: The patient denies current or previous tobacco use. Alcohol use: none Exercise: moderately active  She does get adequate calcium and Vitamin D in her diet.  She has noticed urinary frequency/urgency after voiding for the past few wks. She denies dysuria, LBP, belly pain, fevers. She has a hx of UTIs in the past. She drinks 1 caffeinated drink daily.   Past Medical History:  Diagnosis Date  . Allergy   . Hyperthyroidism 08/07/2014    Past Surgical History:  Procedure Laterality Date  . KNEE SURGERY Right 2006  . TONSILLECTOMY     At age 32    Family History  Problem Relation Age of Onset  . Cervical cancer Maternal Aunt   . Thyroid cancer Maternal Aunt   . Breast cancer Paternal Aunt        ? age    Social History   Social History  . Marital status: Married    Spouse name: N/A  . Number of children: N/A  . Years of education: N/A   Occupational History  . Not on file.   Social History Main Topics  . Smoking status: Never Smoker  . Smokeless tobacco: Never Used  . Alcohol use No  . Drug use: No  . Sexual activity: Yes    Birth control/ protection: IUD   Other Topics Concern  . Not on file   Social History Narrative  . No narrative on file     Current Outpatient  Prescriptions:  .  levonorgestrel (MIRENA) 20 MCG/24HR IUD, by Intrauterine route., Disp: , Rfl:  .  methimazole (TAPAZOLE) 10 MG tablet, , Disp: , Rfl: 4 .  nitrofurantoin, macrocrystal-monohydrate, (MACROBID) 100 MG capsule, Take 1 capsule (100 mg total) by mouth 2 (two) times daily., Disp: 14 capsule, Rfl: 0  ROS:  Review of Systems  Constitutional: Negative for fatigue, fever and unexpected weight change.  Respiratory: Negative for cough, shortness of breath and wheezing.   Cardiovascular: Negative for chest pain, palpitations and leg swelling.  Gastrointestinal: Negative for blood in stool, constipation, diarrhea, nausea and vomiting.  Endocrine: Negative for cold intolerance, heat intolerance and polyuria.  Genitourinary: Positive for urgency. Negative for dyspareunia, dysuria, flank pain, frequency, genital sores, hematuria, menstrual problem, pelvic pain, vaginal bleeding, vaginal discharge and vaginal pain.  Musculoskeletal: Negative for back pain, joint swelling and myalgias.  Skin: Negative for rash.  Neurological: Negative for dizziness, syncope, light-headedness, numbness and headaches.  Hematological: Negative for adenopathy.  Psychiatric/Behavioral: Negative for agitation, confusion, sleep disturbance and suicidal ideas. The patient is not nervous/anxious.      Objective: BP 118/74   Ht 5\' 8"  (1.727 m)   Wt 224 lb (101.6 kg)   BMI 34.06 kg/m    Physical Exam  Constitutional: She is oriented to person, place, and time. She appears well-developed and well-nourished.  Genitourinary: Vagina normal and uterus normal. There is no rash or tenderness on the right labia. There is no rash or tenderness on the left labia. No erythema or tenderness in the vagina. No vaginal discharge found. Right adnexum does not display mass and does not display tenderness. Left adnexum does not display mass and does not display tenderness.  Cervix exhibits visible IUD strings. Cervix does not  exhibit motion tenderness or polyp. Uterus is not enlarged or tender.  Neck: Normal range of motion. No thyromegaly present.  Cardiovascular: Normal rate, regular rhythm and normal heart sounds.   No murmur heard. Pulmonary/Chest: Effort normal and breath sounds normal. Right breast exhibits no mass, no nipple discharge, no skin change and no tenderness. Left breast exhibits no mass, no nipple discharge, no skin change and no tenderness.  Abdominal: Soft. There is no tenderness. There is no guarding.  Musculoskeletal: Normal range of motion.  Neurological: She is alert and oriented to person, place, and time. No cranial nerve deficit.  Psychiatric: She has a normal mood and affect. Her behavior is normal.  Vitals reviewed.   Results: Results for orders placed or performed in visit on 07/20/16 (from the past 24 hour(s))  POCT Urinalysis Dipstick     Status: Abnormal   Collection Time: 07/20/16 10:55 AM  Result Value Ref Range   Color, UA yellow    Clarity, UA     Glucose, UA neg    Bilirubin, UA neg    Ketones, UA small    Spec Grav, UA <=1.005 1.010 - 1.025   Blood, UA neg    pH, UA 6.0 5.0 - 8.0   Protein, UA neg    Urobilinogen, UA negative 0.2 or 1.0 E.U./dL   Nitrite, UA neg    Leukocytes, UA Moderate (2+) (A) Negative    Assessment/Plan: Encounter for annual routine gynecological examination  Cervical cancer screening - Plan: IGP, rfx Aptima HPV ASCU  Encounter for routine checking of intrauterine contraceptive device (IUD) - IUD in place. Due for rem 12/2018  Acute cystitis without hematuria - Rx macrobid. Check C&S. F/u prn.  - Plan: nitrofurantoin, macrocrystal-monohydrate, (MACROBID) 100 MG capsule  Urinary urgency - Plan: POCT Urinalysis Dipstick, Urine Culture             GYN counsel adequate intake of calcium and vitamin D, diet and exercise     F/U  Return in about 1 year (around 07/20/2017).  Alicia B. Copland, PA-C 07/20/2016 10:57 AM

## 2016-07-21 LAB — IGP, RFX APTIMA HPV ASCU

## 2016-07-24 LAB — URINE CULTURE

## 2016-08-25 DIAGNOSIS — F4310 Post-traumatic stress disorder, unspecified: Secondary | ICD-10-CM | POA: Diagnosis not present

## 2016-08-25 DIAGNOSIS — F902 Attention-deficit hyperactivity disorder, combined type: Secondary | ICD-10-CM | POA: Diagnosis not present

## 2016-09-15 DIAGNOSIS — F4310 Post-traumatic stress disorder, unspecified: Secondary | ICD-10-CM | POA: Diagnosis not present

## 2016-09-15 DIAGNOSIS — F902 Attention-deficit hyperactivity disorder, combined type: Secondary | ICD-10-CM | POA: Diagnosis not present

## 2016-09-22 DIAGNOSIS — F4310 Post-traumatic stress disorder, unspecified: Secondary | ICD-10-CM | POA: Diagnosis not present

## 2016-09-22 DIAGNOSIS — F902 Attention-deficit hyperactivity disorder, combined type: Secondary | ICD-10-CM | POA: Diagnosis not present

## 2016-09-23 ENCOUNTER — Encounter: Payer: Self-pay | Admitting: Obstetrics and Gynecology

## 2016-09-23 ENCOUNTER — Ambulatory Visit (INDEPENDENT_AMBULATORY_CARE_PROVIDER_SITE_OTHER): Payer: BLUE CROSS/BLUE SHIELD | Admitting: Obstetrics and Gynecology

## 2016-09-23 VITALS — BP 120/80 | HR 61 | Ht 68.0 in | Wt 209.0 lb

## 2016-09-23 DIAGNOSIS — Z30432 Encounter for removal of intrauterine contraceptive device: Secondary | ICD-10-CM

## 2016-09-23 DIAGNOSIS — Z30011 Encounter for initial prescription of contraceptive pills: Secondary | ICD-10-CM

## 2016-09-23 MED ORDER — DESOGESTREL-ETHINYL ESTRADIOL 0.15-30 MG-MCG PO TABS
1.0000 | ORAL_TABLET | Freq: Every day | ORAL | 10 refills | Status: DC
Start: 2016-09-23 — End: 2017-10-06

## 2016-09-23 NOTE — Progress Notes (Signed)
   Chief Complaint  Patient presents with  . Contraception    removal     History of Present Illness:  Brenda Villegas is a 26 y.o. that had a Mirena IUD placed approximately 3 years ago. Since that time, she has done really well, denies dyspareunia, pelvic pain, non-menstrual bleeding, vaginal d/c, heavy bleeding. She did have some extra discomfort this past month, however, and she just wants to go back to OCPs. She did apri in the past without problems.   Last annual 6/18.   BP 120/80   Pulse 61   Ht 5\' 8"  (1.727 m)   Wt 209 lb (94.8 kg)   BMI 31.78 kg/m   Pelvic exam:  Two IUD strings present seen coming from the cervical os. EGBUS, vaginal vault and cervix: within normal limits  IUD Removal Strings of IUD identified and grasped.  IUD removed without problem with ring forceps.  Pt tolerated this well.  IUD noted to be intact.  Assessment:  Encounter for IUD removal  Encounter for initial prescription of contraceptive pills - OCP start today, condoms for 1 wk. - Plan: desogestrel-ethinyl estradiol (APRI) 0.15-30 MG-MCG tablet  Meds ordered this encounter  Medications  . desogestrel-ethinyl estradiol (APRI) 0.15-30 MG-MCG tablet    Sig: Take 1 tablet by mouth daily.    Dispense:  28 tablet    Refill:  10     Plan: IUD removed and plan for contraception is oral contraceptives (estrogen/progesterone). She was amenable to this plan.  Rosabel Sermeno B. Carlyle Achenbach, PA-C 09/23/2016 11:06 AM

## 2016-09-29 DIAGNOSIS — F902 Attention-deficit hyperactivity disorder, combined type: Secondary | ICD-10-CM | POA: Diagnosis not present

## 2016-09-29 DIAGNOSIS — F4310 Post-traumatic stress disorder, unspecified: Secondary | ICD-10-CM | POA: Diagnosis not present

## 2016-10-13 DIAGNOSIS — F902 Attention-deficit hyperactivity disorder, combined type: Secondary | ICD-10-CM | POA: Diagnosis not present

## 2016-10-13 DIAGNOSIS — F4310 Post-traumatic stress disorder, unspecified: Secondary | ICD-10-CM | POA: Diagnosis not present

## 2016-11-03 DIAGNOSIS — F4310 Post-traumatic stress disorder, unspecified: Secondary | ICD-10-CM | POA: Diagnosis not present

## 2016-11-03 DIAGNOSIS — F902 Attention-deficit hyperactivity disorder, combined type: Secondary | ICD-10-CM | POA: Diagnosis not present

## 2016-11-17 DIAGNOSIS — F4310 Post-traumatic stress disorder, unspecified: Secondary | ICD-10-CM | POA: Diagnosis not present

## 2016-11-17 DIAGNOSIS — F902 Attention-deficit hyperactivity disorder, combined type: Secondary | ICD-10-CM | POA: Diagnosis not present

## 2017-10-06 ENCOUNTER — Ambulatory Visit (INDEPENDENT_AMBULATORY_CARE_PROVIDER_SITE_OTHER): Payer: BLUE CROSS/BLUE SHIELD | Admitting: Obstetrics and Gynecology

## 2017-10-06 ENCOUNTER — Other Ambulatory Visit (HOSPITAL_COMMUNITY)
Admission: RE | Admit: 2017-10-06 | Discharge: 2017-10-06 | Disposition: A | Discharge status: Home / Self Care | Payer: BLUE CROSS/BLUE SHIELD | Source: Ambulatory Visit | Attending: Obstetrics and Gynecology | Admitting: Obstetrics and Gynecology

## 2017-10-06 ENCOUNTER — Encounter: Payer: Self-pay | Admitting: Obstetrics and Gynecology

## 2017-10-06 VITALS — BP 120/70 | HR 59 | Ht 68.0 in | Wt 215.0 lb

## 2017-10-06 DIAGNOSIS — Z3041 Encounter for surveillance of contraceptive pills: Secondary | ICD-10-CM | POA: Diagnosis not present

## 2017-10-06 DIAGNOSIS — Z01419 Encounter for gynecological examination (general) (routine) without abnormal findings: Secondary | ICD-10-CM | POA: Diagnosis not present

## 2017-10-06 DIAGNOSIS — Z124 Encounter for screening for malignant neoplasm of cervix: Secondary | ICD-10-CM

## 2017-10-06 MED ORDER — DESOGESTREL-ETHINYL ESTRADIOL 0.15-30 MG-MCG PO TABS: 1.0000 | ORAL_TABLET | Freq: Every day | ORAL | 3 refills | Status: DC

## 2017-10-06 NOTE — Patient Instructions (Signed)
I value your feedback and entrusting us with your care. If you get a Brenda Villegas patient survey, I would appreciate you taking the time to let us know about your experience today. Thank you! 

## 2017-10-06 NOTE — Progress Notes (Signed)
Chief Complaint  Patient presents with  . Gynecologic Exam     HPI:      Brenda Villegas is a 27 y.o. G1P1001 who LMP was Patient's last menstrual period was 08/30/2017 (approximate)., presents today for her annual examination.  Her menses are Q2 months with OCPs, spotting for 1 day.  Dysmenorrhea mild, occurring with period. She does not have intermenstrual bleeding. Likes OCPs better than IUD.   Sex activity: single partner, contraception OCPs. Last Pap: 07/20/16  Results were: no abnormalities  Hx of STDs: none  There is a FH of breast cancer in her pat aunt and MGGM, genetic testing not indicated. There is no FH of ovarian cancer. The patient does not do self-breast exams.  Tobacco use: vapes occas Alcohol use: none Exercise: very active  She does get adequate calcium and Vitamin D in her diet.   Past Medical History:  Diagnosis Date  . Allergy   . Hyperthyroidism 08/07/2014    Past Surgical History:  Procedure Laterality Date  . KNEE SURGERY Right 2006  . TONSILLECTOMY     At age 27    Family History  Problem Relation Age of Onset  . Ovarian cysts Mother        removed cyst on one side in 2018  . Cervical cancer Maternal Aunt   . Thyroid cancer Maternal Aunt   . Breast cancer Paternal Aunt        ? age    Social History   Socioeconomic History  . Marital status: Married    Spouse name: Not on file  . Number of children: Not on file  . Years of education: Not on file  . Highest education level: Not on file  Occupational History  . Not on file  Social Needs  . Financial resource strain: Not on file  . Food insecurity:    Worry: Not on file    Inability: Not on file  . Transportation needs:    Medical: Not on file    Non-medical: Not on file  Tobacco Use  . Smoking status: Never Smoker  . Smokeless tobacco: Never Used  Substance and Sexual Activity  . Alcohol use: No    Alcohol/week: 0.0 standard drinks  . Drug use: No  . Sexual  activity: Yes    Birth control/protection: Pill  Lifestyle  . Physical activity:    Days per week: Not on file    Minutes per session: Not on file  . Stress: Not on file  Relationships  . Social connections:    Talks on phone: Not on file    Gets together: Not on file    Attends religious service: Not on file    Active member of club or organization: Not on file    Attends meetings of clubs or organizations: Not on file    Relationship status: Not on file  . Intimate partner violence:    Fear of current or ex partner: Not on file    Emotionally abused: Not on file    Physically abused: Not on file    Forced sexual activity: Not on file  Other Topics Concern  . Not on file  Social History Narrative  . Not on file     Current Outpatient Medications:  .  desogestrel-ethinyl estradiol (APRI) 0.15-30 MG-MCG tablet, Take 1 tablet by mouth daily., Disp: 84 tablet, Rfl: 3  ROS:  Review of Systems  Constitutional: Negative for fatigue, fever and unexpected weight change.  Respiratory: Negative for cough, shortness of breath and wheezing.   Cardiovascular: Negative for chest pain, palpitations and leg swelling.  Gastrointestinal: Negative for blood in stool, constipation, diarrhea, nausea and vomiting.  Endocrine: Negative for cold intolerance, heat intolerance and polyuria.  Genitourinary: Negative for dyspareunia, dysuria, flank pain, frequency, genital sores, hematuria, menstrual problem, pelvic pain, urgency, vaginal bleeding, vaginal discharge and vaginal pain.  Musculoskeletal: Negative for back pain, joint swelling and myalgias.  Skin: Negative for rash.  Neurological: Negative for dizziness, syncope, light-headedness, numbness and headaches.  Hematological: Negative for adenopathy.  Psychiatric/Behavioral: Negative for agitation, confusion, sleep disturbance and suicidal ideas. The patient is not nervous/anxious.      Objective: BP 120/70   Pulse (!) 59   Ht 5\' 8"   (1.727 m)   Wt 215 lb (97.5 kg)   LMP 08/30/2017 (Approximate)   BMI 32.69 kg/m    Physical Exam  Constitutional: She is oriented to person, place, and time. She appears well-developed and well-nourished.  Genitourinary: Vagina normal and uterus normal. There is no rash or tenderness on the right labia. There is no rash or tenderness on the left labia. No erythema or tenderness in the vagina. No vaginal discharge found. Right adnexum does not display mass and does not display tenderness. Left adnexum does not display mass and does not display tenderness. Cervix does not exhibit motion tenderness or polyp. Uterus is not enlarged or tender.  Neck: Normal range of motion. No thyromegaly present.  Cardiovascular: Normal rate, regular rhythm and normal heart sounds.  No murmur heard. Pulmonary/Chest: Effort normal and breath sounds normal. Right breast exhibits no mass, no nipple discharge, no skin change and no tenderness. Left breast exhibits no mass, no nipple discharge, no skin change and no tenderness.  Abdominal: Soft. There is no tenderness. There is no guarding.  Musculoskeletal: Normal range of motion.  Neurological: She is alert and oriented to person, place, and time. No cranial nerve deficit.  Psychiatric: She has a normal mood and affect. Her behavior is normal.  Vitals reviewed.   Assessment/Plan: Encounter for annual routine gynecological examination  Cervical cancer screening - Plan: Cytology - PAP  Encounter for surveillance of contraceptive pills - OCP RF - Plan: desogestrel-ethinyl estradiol (APRI) 0.15-30 MG-MCG tablet             GYN counsel adequate intake of calcium and vitamin D, diet and exercise     F/U  Return in about 1 year (around 10/07/2018).  Alicia B. Copland, PA-C 10/06/2017 10:45 AM

## 2017-10-07 LAB — CYTOLOGY - PAP: Diagnosis: NEGATIVE

## 2018-01-17 DIAGNOSIS — M222X1 Patellofemoral disorders, right knee: Secondary | ICD-10-CM | POA: Diagnosis not present

## 2018-05-18 ENCOUNTER — Telehealth: Payer: Self-pay

## 2018-05-18 ENCOUNTER — Other Ambulatory Visit: Payer: Self-pay | Admitting: Obstetrics and Gynecology

## 2018-05-18 DIAGNOSIS — N3000 Acute cystitis without hematuria: Secondary | ICD-10-CM

## 2018-05-18 MED ORDER — NITROFURANTOIN MONOHYD MACRO 100 MG PO CAPS: 100.0000 mg | ORAL_CAPSULE | Freq: Two times a day (BID) | ORAL | 0 refills | Status: AC

## 2018-05-18 NOTE — Telephone Encounter (Signed)
Pt called; has UTI; has taken OTC stuff but it hasn't cleared up; doesn't have the urgency anymore but still hurts to pee.  Can rx be sent in?  [phone]

## 2018-05-18 NOTE — Telephone Encounter (Signed)
Rx macrobid eRxd. F/u for UA if sx persist.

## 2018-05-18 NOTE — Progress Notes (Signed)
Rx macrobid for UTI sx. F/u prn.

## 2018-05-18 NOTE — Telephone Encounter (Signed)
Please advise 

## 2018-10-08 NOTE — Progress Notes (Signed)
Chief Complaint  Patient presents with  . Gynecologic Exam     HPI:      [name] is a 28 y.o. G1P1001 who LMP was No LMP recorded. (Menstrual status: Oral contraceptives)., presents today for her annual examination.  Her menses are usually monthly, spotting for 1 day only, some months no bleeding.  Dysmenorrhea mild, occurring with period. She does not have intermenstrual bleeding. Likes OCPs better than IUD.   Sex activity: single partner, contraception OCPs. Last Pap: 10/06/17  Results were: no abnormalities  Hx of STDs: none  There is a FH of breast cancer in her pat aunt and MGGM, genetic testing not indicated. There is no FH of ovarian cancer. The patient does not do self-breast exams.  Tobacco use: vapes occas, plans to quit Alcohol use: none  Drug use: none Exercise: very active  She does get adequate calcium but not Vitamin D in her diet.   Past Medical History:  Diagnosis Date  . Allergy   . Hyperthyroidism 08/07/2014    Past Surgical History:  Procedure Laterality Date  . KNEE SURGERY Right 2006  . TONSILLECTOMY     At age 48    Family History  Problem Relation Age of Onset  . Ovarian cysts Mother        removed cyst on one side in 2018  . Cervical cancer Maternal Aunt   . Thyroid cancer Maternal Aunt   . Breast cancer Paternal Aunt        ? age    Social History   Socioeconomic History  . Marital status: Married    Spouse name: Not on file  . Number of children: Not on file  . Years of education: Not on file  . Highest education level: Not on file  Occupational History  . Not on file  Social Needs  . Financial resource strain: Not on file  . Food insecurity    Worry: Not on file    Inability: Not on file  . Transportation needs    Medical: Not on file    Non-medical: Not on file  Tobacco Use  . Smoking status: Never Smoker  . Smokeless tobacco: Never Used  Substance and Sexual Activity  . Alcohol use: No    Alcohol/week:  0.0 standard drinks  . Drug use: No  . Sexual activity: Yes    Birth control/protection: Pill  Lifestyle  . Physical activity    Days per week: Not on file    Minutes per session: Not on file  . Stress: Not on file  Relationships  . Social Musician on phone: Not on file    Gets together: Not on file    Attends religious service: Not on file    Active member of club or organization: Not on file    Attends meetings of clubs or organizations: Not on file    Relationship status: Not on file  . Intimate partner violence    Fear of current or ex partner: Not on file    Emotionally abused: Not on file    Physically abused: Not on file    Forced sexual activity: Not on file  Other Topics Concern  . Not on file  Social History Narrative  . Not on file     Current Outpatient Medications:  .  desogestrel-ethinyl estradiol (APRI) 0.15-30 MG-MCG tablet, Take 1 tablet by mouth daily., Disp: 84 tablet, Rfl: 3  ROS:  Review of Systems  Constitutional: Negative for fatigue, fever and unexpected weight change.  Respiratory: Negative for cough, shortness of breath and wheezing.   Cardiovascular: Negative for chest pain, palpitations and leg swelling.  Gastrointestinal: Negative for blood in stool, constipation, diarrhea, nausea and vomiting.  Endocrine: Negative for cold intolerance, heat intolerance and polyuria.  Genitourinary: Negative for dyspareunia, dysuria, flank pain, frequency, genital sores, hematuria, menstrual problem, pelvic pain, urgency, vaginal bleeding, vaginal discharge and vaginal pain.  Musculoskeletal: Negative for back pain, joint swelling and myalgias.  Skin: Negative for rash.  Neurological: Negative for dizziness, syncope, light-headedness, numbness and headaches.  Hematological: Negative for adenopathy.  Psychiatric/Behavioral: Negative for agitation, confusion, sleep disturbance and suicidal ideas. The patient is not nervous/anxious.       Objective: BP 100/70   Ht 5\' 8"  (1.727 m)   Wt 179 lb (81.2 kg)   BMI 27.22 kg/m    Physical Exam Constitutional:      Appearance: She is well-developed.  Genitourinary:     Vulva, vagina, uterus, right adnexa and left adnexa normal.     No vulval lesion or tenderness noted.     No vaginal discharge, erythema or tenderness.     No cervical motion tenderness or polyp.     Uterus is not enlarged or tender.     No right or left adnexal mass present.     Right adnexa not tender.     Left adnexa not tender.  Neck:     Musculoskeletal: Normal range of motion.     Thyroid: No thyromegaly.  Cardiovascular:     Rate and Rhythm: Normal rate and regular rhythm.     Heart sounds: Normal heart sounds. No murmur.  Pulmonary:     Effort: Pulmonary effort is normal.     Breath sounds: Normal breath sounds.  Chest:     Breasts:        Right: No mass, nipple discharge, skin change or tenderness.        Left: No mass, nipple discharge, skin change or tenderness.  Abdominal:     Palpations: Abdomen is soft.     Tenderness: There is no abdominal tenderness. There is no guarding.  Musculoskeletal: Normal range of motion.  Neurological:     General: No focal deficit present.     Mental Status: She is alert and oriented to person, place, and time.     Cranial Nerves: No cranial nerve deficit.  Skin:    General: Skin is warm and dry.  Psychiatric:        Mood and Affect: Mood normal.        Behavior: Behavior normal.        Thought Content: Thought content normal.        Judgment: Judgment normal.  Vitals signs reviewed.     Assessment/Plan: Encounter for annual routine gynecological examination  Encounter for surveillance of contraceptive pills - OCP RF - Plan: desogestrel-ethinyl estradiol (APRI) 0.15-30 MG-MCG tablet  Meds ordered this encounter  Medications  . desogestrel-ethinyl estradiol (APRI) 0.15-30 MG-MCG tablet    Sig: Take 1 tablet by mouth daily.    Dispense:  84  tablet    Refill:  3    Order Specific Question:   Supervising Provider    Answer:   Nadara MustardHARRIS, ROBERT P [[id number]]               GYN counsel adequate intake of calcium and vitamin D, diet and exercise     F/U  Return in about  1 year (around 10/09/2019).  Alicia B. Copland, PA-C 10/09/2018 9:30 AM

## 2018-10-09 ENCOUNTER — Other Ambulatory Visit: Payer: Self-pay

## 2018-10-09 ENCOUNTER — Encounter: Payer: Self-pay | Admitting: Obstetrics and Gynecology

## 2018-10-09 ENCOUNTER — Ambulatory Visit (INDEPENDENT_AMBULATORY_CARE_PROVIDER_SITE_OTHER): Payer: BC Managed Care – PPO | Admitting: Obstetrics and Gynecology

## 2018-10-09 VITALS — BP 100/70 | Ht 68.0 in | Wt 179.0 lb

## 2018-10-09 DIAGNOSIS — Z01419 Encounter for gynecological examination (general) (routine) without abnormal findings: Secondary | ICD-10-CM | POA: Diagnosis not present

## 2018-10-09 DIAGNOSIS — Z3041 Encounter for surveillance of contraceptive pills: Secondary | ICD-10-CM

## 2018-10-09 MED ORDER — DESOGESTREL-ETHINYL ESTRADIOL 0.15-30 MG-MCG PO TABS: 1.0000 | ORAL_TABLET | Freq: Every day | ORAL | 3 refills | Status: DC

## 2018-10-09 NOTE — Patient Instructions (Signed)
I value your feedback and entrusting us with your care. If you get a [facility] patient survey, I would appreciate you taking the time to let us know about your experience today. Thank you! 

## 2019-06-28 ENCOUNTER — Ambulatory Visit (INDEPENDENT_AMBULATORY_CARE_PROVIDER_SITE_OTHER): Payer: No Typology Code available for payment source | Admitting: Obstetrics and Gynecology

## 2019-06-28 ENCOUNTER — Other Ambulatory Visit (HOSPITAL_COMMUNITY)
Admission: RE | Admit: 2019-06-28 | Discharge: 2019-06-28 | Disposition: A | Discharge status: Home / Self Care | Payer: Self-pay | Source: Ambulatory Visit | Attending: Obstetrics and Gynecology | Admitting: Obstetrics and Gynecology

## 2019-06-28 ENCOUNTER — Other Ambulatory Visit: Payer: Self-pay

## 2019-06-28 ENCOUNTER — Encounter: Payer: Self-pay | Admitting: Obstetrics and Gynecology

## 2019-06-28 VITALS — BP 122/76 | HR 75 | Wt 210.0 lb

## 2019-06-28 DIAGNOSIS — Z3480 Encounter for supervision of other normal pregnancy, unspecified trimester: Secondary | ICD-10-CM

## 2019-06-28 DIAGNOSIS — Z6791 Unspecified blood type, Rh negative: Secondary | ICD-10-CM

## 2019-06-28 DIAGNOSIS — O26899 Other specified pregnancy related conditions, unspecified trimester: Secondary | ICD-10-CM | POA: Insufficient documentation

## 2019-06-28 DIAGNOSIS — Z113 Encounter for screening for infections with a predominantly sexual mode of transmission: Secondary | ICD-10-CM

## 2019-06-28 DIAGNOSIS — Z3689 Encounter for other specified antenatal screening: Secondary | ICD-10-CM

## 2019-06-28 DIAGNOSIS — O26891 Other specified pregnancy related conditions, first trimester: Secondary | ICD-10-CM

## 2019-06-28 DIAGNOSIS — O0990 Supervision of high risk pregnancy, unspecified, unspecified trimester: Secondary | ICD-10-CM | POA: Insufficient documentation

## 2019-06-28 DIAGNOSIS — N912 Amenorrhea, unspecified: Secondary | ICD-10-CM

## 2019-06-28 DIAGNOSIS — Z8639 Personal history of other endocrine, nutritional and metabolic disease: Secondary | ICD-10-CM | POA: Insufficient documentation

## 2019-06-28 DIAGNOSIS — Z3A09 9 weeks gestation of pregnancy: Secondary | ICD-10-CM

## 2019-06-28 HISTORY — DX: Supervision of high risk pregnancy, unspecified, unspecified trimester: O09.90

## 2019-06-28 LAB — POCT URINE PREGNANCY: Preg Test, Ur: POSITIVE — AB

## 2019-06-28 NOTE — Progress Notes (Signed)
NOB LMP unknown d/t continual birthcontrol Ready, Set, Baby information given

## 2019-06-28 NOTE — Patient Instructions (Signed)
This is a very exciting time for you and your family, so congratulations from everybody here at [facility]! You have just embarked on a very amazing journey. The next several months will be filled with wondrous emotions and miraculous memories. As you begin your preparations, this office wanted you to be aware of a few prenatal genetic laboratory tests that are available to you early in your pregnancy. These tests are optional and you may decide to opt out of the testing.   Patients often voice their desire to opt out of this testing as it would not change their decisions on continuing the pregnancy.  However, our providers value the information they receive from these tests as they allow us to optimize the care for you and your baby prior to delivery.    There are 6 genetic laboratory tests this office offers, and they test for a variety of different genetic diseases or chromosomal abnormalities. By utilizing these tests, the providers can better understand your risk associated with passing on a genetic condition to your child. These tests are screening tests, and are not used to diagnose any condition. If one of these tests results abnormal, then a diagnostic test will be offered to you. It is important to remember that most pregnancies will result in a beautiful and healthy baby. Knowing the results of these tests will also help you better prepare for your delivery.  We encourage you to read over the brief descriptions below and engage with your provider regarding any additional questions you may have.  Please also make your provider aware if you or your partner has any Jewish ancestry as there may be additional testing that you qualify for. The tests that will be ordered are: 1. Cystic Fibrosis Carrier Screen1 (Blood Test): The most common autosomal recessive disorder. This disease causes thick mucus to build up in the lungs, which leads to repetitive chest infections. The carrier frequency in caucasians is  roughly 1 in 30 people in the [place], but varies by ethnicity.   2. Spinal Muscular Atrophy Carrier Screen1 (Blood Test):  The second most common autosomal recessive disorder and the most common inherited form of early childhood death. This is degenerative neuromuscular condition that affects the child's ability to sit, smile, breath, swallow, etc. The carrier frequency is the [place] is roughly 1 in 54, but varies by ethnicity. 3. Fragile X Syndrome Carrier Screen1 (Blood Test): The most common form of inherited mental retardation. The severity of the retardation varies. Fragile X is most commonly passed from mother to child, and 1 in 260 people in the [place] are carriers. 4. Downs Syndrome, Trisomy 18: Trisomy 21 ( Downs Syndrome) and Trisomy 18 are common forms of mental retardation due to chromosomal abnormalities. Downs is the milder of the two, and it occurs 1 in 700 live births. Trisomy 18 is a more severe form of mental retardation, and occurs in 1 in 6000 live births. The risk of conceiving a baby with one of these two genetic conditions is independent of family history.  The test offered to you will depend on how far along you are in your pregnancy.  The main risk factor that has been identified as predisposing to either trisomy 21 or trisomy 18 is the age of the mother, with increased risk for those patients over the age of 35 at the time of delivery.  If you are deemed high risk on the initial screening test or are over 35 you may be a   candidate for cell free fetal DNA testing or amniocentesis outlined below.  See #6, 7, 8, and 9 for available testing options 5.  MSAFP2 Maternal Serum Alpha Feto-Protein (Blood Test):  Open Neural Tube Defects deal with an opening in the spinal column that does not close properly during pregnancy. It occurs when the tissues that fold to form the neural tube do not close or do not stay closed completely. This causes an opening in the vertebrae,  which surround and protect the spinal cord. The most common form is Spina Bifida. It occurs in 1 in 1000 live births. The folic acid in prenatal vitamins can decrease the risk of you baby developing this birth defect.  If you have had a prior pregnancy complicated by spina bifida you may need higher doses of folic acid.   6. 1st trimester screening2 (Blood Test):  this test is a combination of an ultrasound measurement of your baby's neck and blood work ideally obtained between 11 weeks and 13 weeks 6 days gestation.  It tests for #4 above 7. Tetra Screen2 (Blood Test):  this is a combined blood test offered at 15 to [redacted] weeks gestation.  It tests for conditions #4 and #5 above 8. Cell Free Fetal DNA2 (Blood Test):  this test is available for our patient over 35 or patient who were found to be at increased risk for down syndrome or trisomy 18 based on either 1st trimester screening results or serum tetra screening.  At present this testing is still considered screening rather than diagnostic 9. Amniocentesis - this test is available for our patient over 35 or patient who were found to be at increased risk for down syndrome or trisomy 18 based on either 1st trimester screening results or serum tetra screening.  This test involves using a needle to draw off some of the fluid from around the baby in order to determine if the fetus is affected by either trisomy 21 (Downs Syndrome) or trisomy 18.  In addition this test may be suggested or offered by your provider in other circumstances.  This test is considered diagnostic as opposed to screening meaning that it can definitively rule in or rule out the tested condition.  Unlike the other testing discussed amniocentesis is an invasive procedure and is associated with a small risk (approximately 1 in 200) of resulting in a miscarriage. 1. It is also important to notate that if you screen positive for carrier status for Cystic Fibrosis or Spinal Muscular Atrophy, that  does not mean your child will be affected with the condition. The child's father will also need to be tested. If both of you are carriers, then there is a 25% that you will have an affected child. The risk of having a child affected with Downs Syndrome or Trisomy 18 varies by maternal age. There is not a "carrier" status for these conditions. If you would like more information of these conditions, please see the handouts in your packet of information. 2. Denotes screening tests.   These tests can assess the risk of the pregnancy being affected by a particular condition.  It is important to note that even if testing deems that you are at increased risk your baby may still be unaffected.  Conversely, test results indicating that your baby is at low risk for the tested condition does not rule out that the baby could still be affected by that condition  

## 2019-06-28 NOTE — Progress Notes (Signed)
New Obstetric Patient H&P    Chief Complaint: "Desires prenatal care"   History of Present Illness: Patient is a 29 y.o. G2P1001 Not Hispanic or Latino female, presents with amenorrhea and positive home pregnancy test. Patient's last menstrual period was 04/23/2019 (lmp unknown). and based on her  LMP, her EDD is Estimated Date of Delivery: 01/28/20 and her EGA is [redacted]w[redacted]d. Her last pap smear was 10/06/2017 and was no abnormalities.    Since her LMP she claims she has experienced nausea. She denies vaginal bleeding. Her past medical history is contibutory, prior history of graves not currently on medication. Her prior pregnancies are notable for none  Since her LMP, she admits to the use of tobacco products  no There are cats in the home in the home  no  She admits close contact with children on a regular basis  yes  She has had chicken pox in the past yes She has had Tuberculosis exposures, symptoms, or previously tested positive for TB   no Current or past history of domestic violence. no  Genetic Screening/Teratology Counseling: (Includes patient, baby's father, or anyone in either family with:)   1. Patient's age >/= 29 [facility]  no 2. Thalassemia ([place], [place], Mediterranean, or Asian background): MCV<80  no 3. Neural tube defect (meningomyelocele, spina bifida, anencephaly)  no 4. Congenital heart defect  no  5. Down syndrome  no 6. Tay-Sachs (Jewish, [place]  no 7. Canavan's Disease  no 8. Sickle cell disease or trait (African)  no  9. Hemophilia or other blood disorders  no  10. Muscular dystrophy  no  11. Cystic fibrosis  no  12. Huntington's Chorea  no 13. Mental retardation/autism  no 14. Other inherited genetic or chromosomal disorder  no 15. Maternal metabolic disorder (DM, PKU, etc)  no 16. Patient or FOB with a child with a birth defect not listed above no  16a. Patient or FOB with a birth defect themselves no 17. Recurrent pregnancy loss, or stillbirth  no    18. Any medications since LMP other than prenatal vitamins (include vitamins, supplements, OTC meds, drugs, alcohol)  no 19. Any other genetic/environmental exposure to discuss  no  Infection History:   1. Lives with someone with TB or TB exposed  no  2. Patient or partner has history of genital herpes  no 3. Rash or viral illness since LMP  no 4. History of STI (GC, CT, HPV, syphilis, HIV)  no 5. History of recent travel :  no  Other pertinent information:  no   Stopped vaping, did consume EtOH prior to getting positive UPT 4 weeks   Review of Systems:10 point review of systems negative unless otherwise noted in HPI  Past Medical History:  Patient Active Problem List   Diagnosis Date Noted  . Supervision of other normal pregnancy, antepartum 06/28/2019  . H/O Graves' disease 06/28/2019  . Rh negative state in antepartum period 06/28/2019  . Depression 08/30/2014  . Hyperthyroidism 08/07/2014    Past Surgical History:  Past Surgical History:  Procedure Laterality Date  . KNEE SURGERY Right 2006  . TONSILLECTOMY     At age 29    Gynecologic History: Patient's last menstrual period was 04/23/2019 (lmp unknown).  Obstetric History: G2P1001  Family History:  Family History  Problem Relation Age of Onset  . Ovarian cysts Mother        removed cyst on one side in 2018  . Cervical cancer Maternal Aunt   .  Thyroid cancer Maternal Aunt   . Breast cancer Paternal Aunt        ? age    Social History:  Social History   Socioeconomic History  . Marital status: Married    Spouse name: Not on file  . Number of children: Not on file  . Years of education: Not on file  . Highest education level: Not on file  Occupational History  . Not on file  Tobacco Use  . Smoking status: Never Smoker  . Smokeless tobacco: Never Used  Substance and Sexual Activity  . Alcohol use: No    Alcohol/week: 0.0 standard drinks  . Drug use: No  . Sexual activity: Yes    Birth  control/protection: None  Other Topics Concern  . Not on file  Social History Narrative  . Not on file   Social Determinants of Health   Financial Resource Strain:   . Difficulty of Paying Living Expenses:   Food Insecurity:   . Worried About Charity fundraiser in the Last Year:   . Arboriculturist in the Last Year:   Transportation Needs:   . Film/video editor (Medical):   Marland Kitchen Lack of Transportation (Non-Medical):   Physical Activity:   . Days of Exercise per Week:   . Minutes of Exercise per Session:   Stress:   . Feeling of Stress :   Social Connections:   . Frequency of Communication with Friends and Family:   . Frequency of Social Gatherings with Friends and Family:   . Attends Religious Services:   . Active Member of Clubs or Organizations:   . Attends Archivist Meetings:   Marland Kitchen Marital Status:   Intimate Partner Violence:   . Fear of Current or Ex-Partner:   . Emotionally Abused:   Marland Kitchen Physically Abused:   . Sexually Abused:     Allergies:  Allergies  Allergen Reactions  . Penicillins Hives    Medications: Prior to Admission medications   Not on File    Physical Exam Vitals: Blood pressure 122/76, pulse 75, weight 210 lb (95.3 kg), last menstrual period 04/23/2019.  General: NAD HEENT: normocephalic, anicteric Thyroid: no enlargement, no palpable nodules Pulmonary: No increased work of breathing, CTAB Cardiovascular: RRR, distal pulses 2+ Abdomen: NABS, soft, non-tender, non-distended.  Umbilicus without lesions.  No hepatomegaly, splenomegaly or masses palpable. No evidence of hernia  Genitourinary:  External: Normal external female genitalia.  Normal urethral meatus, normal  Bartholin's and Skene's glands.    Vagina: Normal vaginal mucosa, no evidence of prolapse.    Cervix: Grossly normal in appearance, no bleeding  Uterus:  Non-enlarged, mobile, normal contour.  No CMT  Adnexa: ovaries non-enlarged, no adnexal masses  Rectal:  deferred Extremities: no edema, erythema, or tenderness Neurologic: Grossly intact Psychiatric: mood appropriate, affect full   Assessment: 29 y.o. G2P1001 at [redacted]w[redacted]d presenting to initiate prenatal care  Plan: 1) Avoid alcoholic beverages. 2) Patient encouraged not to smoke.  3) Discontinue the use of all non-medicinal drugs and chemicals.  4) Take prenatal vitamins daily.  5) Nutrition, food safety (fish, cheese advisories, and high nitrite foods) and exercise discussed. 6) Hospital and practice style discussed with cross coverage system.  7) Genetic Screening, such as with 1st Trimester Screening, cell free fetal DNA, AFP testing, and Ultrasound, as well as with amniocentesis and CVS as appropriate, is discussed with patient. At the conclusion of today's visit patient requested genetic testing   Malachy Mood, MD, [address]  OB/GYN, [facility] 06/28/2019, 10:29 AM

## 2019-06-29 LAB — THYROID PANEL WITH TSH
Free Thyroxine Index: 1.7 (ref 1.2–4.9)
T3 Uptake Ratio: 25 % (ref 24–39)
T4, Total: 6.9 ug/dL (ref 4.5–12.0)
TSH: 0.735 u[IU]/mL (ref 0.450–4.500)

## 2019-06-29 LAB — COMPREHENSIVE METABOLIC PANEL
ALT: 13 IU/L (ref 0–32)
AST: 14 IU/L (ref 0–40)
Albumin/Globulin Ratio: 1.6 (ref 1.2–2.2)
Albumin: 4.3 g/dL (ref 3.9–5.0)
Alkaline Phosphatase: 83 IU/L (ref 48–121)
BUN/Creatinine Ratio: 12 (ref 9–23)
BUN: 9 mg/dL (ref 6–20)
Bilirubin Total: 0.5 mg/dL (ref 0.0–1.2)
CO2: 21 mmol/L (ref 20–29)
Calcium: 9.2 mg/dL (ref 8.7–10.2)
Chloride: 104 mmol/L (ref 96–106)
Creatinine, Ser: 0.76 mg/dL (ref 0.57–1.00)
GFR calc Af Amer: 123 mL/min/{1.73_m2} (ref >59)
GFR calc non Af Amer: 107 mL/min/{1.73_m2} (ref >59)
Globulin, Total: 2.7 g/dL (ref 1.5–4.5)
Glucose: 79 mg/dL (ref 65–99)
Potassium: 4.4 mmol/L (ref 3.5–5.2)
Sodium: 135 mmol/L (ref 134–144)
Total Protein: 7 g/dL (ref 6.0–8.5)

## 2019-06-29 LAB — RPR+RH+ABO+RUB AB+AB SCR+CB...
Antibody Screen: NEGATIVE
HIV Screen 4th Generation wRfx: NONREACTIVE
Hematocrit: 40.2 % (ref 34.0–46.6)
Hemoglobin: 13.4 g/dL (ref 11.1–15.9)
Hepatitis B Surface Ag: NEGATIVE
MCH: 29.9 pg (ref 26.6–33.0)
MCHC: 33.3 g/dL (ref 31.5–35.7)
MCV: 90 fL (ref 79–97)
Platelets: 175 10*3/uL (ref 150–450)
RBC: 4.48 x10E6/uL (ref 3.77–5.28)
RDW: 12.4 % (ref 11.7–15.4)
RPR Ser Ql: NONREACTIVE
Rh Factor: NEGATIVE
Rubella Antibodies, IGG: 3.58 index (ref >0.99)
Varicella zoster IgG: 856 index (ref >165)
WBC: 8.8 10*3/uL (ref 3.4–10.8)

## 2019-06-29 LAB — CERVICOVAGINAL ANCILLARY ONLY
Chlamydia: NEGATIVE
Comment: NEGATIVE
Comment: NORMAL
Neisseria Gonorrhea: NEGATIVE

## 2019-06-29 LAB — THYROID STIMULATING IMMUNOGLOBULIN: Thyroid Stim Immunoglobulin: <0.1 IU/L (ref 0.00–0.55)

## 2019-06-29 LAB — PROTEIN / CREATININE RATIO, URINE
Creatinine, Urine: 104.8 mg/dL
Protein, Ur: 6.1 mg/dL
Protein/Creat Ratio: 58 mg/g creat (ref 0–200)

## 2019-06-30 LAB — URINE CULTURE

## 2019-07-03 ENCOUNTER — Encounter: Payer: Self-pay | Admitting: Obstetrics and Gynecology

## 2019-07-03 ENCOUNTER — Other Ambulatory Visit: Payer: Self-pay | Admitting: Obstetrics and Gynecology

## 2019-07-03 ENCOUNTER — Other Ambulatory Visit: Payer: Self-pay

## 2019-07-03 ENCOUNTER — Ambulatory Visit (INDEPENDENT_AMBULATORY_CARE_PROVIDER_SITE_OTHER): Payer: No Typology Code available for payment source

## 2019-07-03 ENCOUNTER — Ambulatory Visit (INDEPENDENT_AMBULATORY_CARE_PROVIDER_SITE_OTHER): Payer: No Typology Code available for payment source | Admitting: Obstetrics and Gynecology

## 2019-07-03 VITALS — BP 120/80 | Wt 213.0 lb

## 2019-07-03 DIAGNOSIS — Z3481 Encounter for supervision of other normal pregnancy, first trimester: Secondary | ICD-10-CM | POA: Diagnosis not present

## 2019-07-03 DIAGNOSIS — Z3689 Encounter for other specified antenatal screening: Secondary | ICD-10-CM | POA: Diagnosis not present

## 2019-07-03 DIAGNOSIS — Z6791 Unspecified blood type, Rh negative: Secondary | ICD-10-CM

## 2019-07-03 DIAGNOSIS — Z3480 Encounter for supervision of other normal pregnancy, unspecified trimester: Secondary | ICD-10-CM

## 2019-07-03 DIAGNOSIS — Z3A01 Less than 8 weeks gestation of pregnancy: Secondary | ICD-10-CM

## 2019-07-03 DIAGNOSIS — O0990 Supervision of high risk pregnancy, unspecified, unspecified trimester: Secondary | ICD-10-CM

## 2019-07-03 DIAGNOSIS — Z8639 Personal history of other endocrine, nutritional and metabolic disease: Secondary | ICD-10-CM

## 2019-07-03 DIAGNOSIS — O26899 Other specified pregnancy related conditions, unspecified trimester: Secondary | ICD-10-CM

## 2019-07-03 NOTE — Progress Notes (Signed)
Routine Prenatal Care Visit  Subjective  Brenda Villegas is a 29 y.o. G2P1001 at [redacted]w[redacted]d being seen today for ongoing prenatal care.  She is currently monitored for the following issues for this high-risk pregnancy and has Hyperthyroidism; Depression; Supervision of high risk pregnancy, antepartum; H/O Graves' disease; and Rh negative state in antepartum period on their problem list.  ----------------------------------------------------------------------------------- Patient reports mild nausea.    . Vag. Bleeding: None.   . Leaking Fluid denies.  Pelvic u/s changes EDD. Singleton living IUP.  ----------------------------------------------------------------------------------- The following portions of the patient's history were reviewed and updated as appropriate: allergies, current medications, past family history, past medical history, past social history, past surgical history and problem list. Problem list updated.  Objective  Blood pressure 120/80, weight 213 lb (96.6 kg), last menstrual period 04/23/2019. Pregravid weight 197 lb (89.4 kg) Total Weight Gain 16 lb (7.258 kg) Urinalysis: Urine Protein    Urine Glucose    Fetal Status: Fetal Heart Rate (bpm): 122         General:  Alert, oriented and cooperative. Patient is in no acute distress.  Skin: Skin is warm and dry. No rash noted.   Cardiovascular: Normal heart rate noted  Respiratory: Normal respiratory effort, no problems with respiration noted  Abdomen: Soft, gravid, appropriate for gestational age. Pain/Pressure: Present     Pelvic:  Cervical exam deferred        Extremities: Normal range of motion.     Mental Status: Normal mood and affect. Normal behavior. Normal judgment and thought content.   Imaging Results US OB Transvaginal  Result Date: 07/03/2019 Patient Name: CARLO LORSON DOB: 13-Nov-1990 MRN: [id number] ULTRASOUND REPORT Location: [facility] Date of Service: 07/03/2019 Indications:dating/viability  Findings: Mason Jim intrauterine pregnancy is visualized with a CRL consistent with [redacted]w[redacted]d gestation, giving an (U/S) EDD of 02/20/2020. FHR: 122 BPM CRL measurement: 8.8 mm Yolk sac is visualized and appears normal. Amnion: visualized and appears normal Right Ovary is normal in appearance. Left Ovary is normal appearance. Corpus luteal cyst:  Left ovary Survey of the adnexa demonstrates no adnexal masses. There is no free peritoneal fluid in the cul de sac. Impression: 1. [redacted]w[redacted]d Viable Singleton Intrauterine pregnancy by U/S. Deanna Artis, RT There is a viable singleton gestation.  Detailed evaluation of the fetal anatomy is precluded by early gestational age.  It must be noted that a normal ultrasound particular at this early gestational age is unable to rule out fetal aneuploidy, risk of first trimester miscarriage, or anatomic birth defects. Thomasene Mohair, MD, [facility], [facility] 07/03/2019 4:39 PM      Assessment   28 y.o. G2P1001 at [redacted]w[redacted]d by  02/20/2020, by Ultrasound presenting for routine prenatal visit  Plan   Pregnancy#2 Problems (from 04/23/19 to present)    Problem Noted Resolved   Supervision of high risk pregnancy, antepartum 06/28/2019 by Vena Austria, MD No   Overview Addendum 07/03/2019  5:01 PM by Conard Novak, MD    Clinic [facility] Prenatal Labs  Dating 6 week ultrasound Blood type: O/Negative/-- (05/20 1121)   Genetic Screen Declines AFP: [ ]  Antibody:Negative (05/20 1121)  Anatomic [birth year]1983  Rubella: 3.58 (05/20 1121)  Varicella: Immune  GTT Early:               Third trimester:  RPR: Non Reactive (05/20 1121)   Rhogam [ ]  28 weeks HBsAg: Negative (05/20 1121)   TDaP vaccine  Flu Shot: Covid: has not had HIV: Non Reactive (05/20 1121)   Baby Food                                GBS:   Contraception  Pap: 10/06/2017 NILM  CBB     CS/VBAC    Support Person            H/O Berenice Primas' disease 06/28/2019 by Malachy Mood, MD No   Overview Signed 07/03/2019  5:01 PM by Will Bonnet, MD    - on no meds - normal TFTs at NOB. TSI negative at NOB      Rh negative state in antepartum period 06/28/2019 by Malachy Mood, MD No       Preterm labor symptoms and general obstetric precautions including but not limited to vaginal bleeding, contractions, leaking of fluid and fetal movement were reviewed in detail with the patient. Please refer to After Visit Summary for other counseling recommendations.   Declines genetic screening   Return in about 4 weeks (around 07/31/2019) for Routine Prenatal Appointment.  Prentice Docker, MD, [facility], [facility] 07/03/2019 5:02 PM

## 2019-07-03 NOTE — Patient Instructions (Signed)
For nausea (these may be purchased over-the-counter): -Vitamin B6 (pyridoxine):  25 mg three times each day (may buy 100 mg tablet and take twice per day or try to cut into 4 equal pieces and take 1 piece three times each day).  - doxylamine (found in [employer] and other sleep agents that can be bought in the store): take 25 - 50 mg at bedtime.  May take up to 25 mg three time each day.  However, keep in mind that this might make you sleepy.  

## 2019-07-31 ENCOUNTER — Other Ambulatory Visit: Payer: Self-pay

## 2019-07-31 ENCOUNTER — Ambulatory Visit (INDEPENDENT_AMBULATORY_CARE_PROVIDER_SITE_OTHER): Payer: No Typology Code available for payment source | Admitting: Certified Nurse Midwife

## 2019-07-31 VITALS — BP 120/70 | Ht 68.0 in | Wt 213.8 lb

## 2019-07-31 DIAGNOSIS — O0990 Supervision of high risk pregnancy, unspecified, unspecified trimester: Secondary | ICD-10-CM

## 2019-07-31 DIAGNOSIS — Z3A10 10 weeks gestation of pregnancy: Secondary | ICD-10-CM

## 2019-07-31 DIAGNOSIS — O0991 Supervision of high risk pregnancy, unspecified, first trimester: Secondary | ICD-10-CM

## 2019-07-31 LAB — POCT URINALYSIS DIPSTICK OB: Glucose, UA: NEGATIVE

## 2019-08-05 NOTE — Progress Notes (Signed)
ROB at 10wk6d: Some headaches since her last visit. She thinks they are from nicotine withdrawal. Resolved with vaping (took a hit). Decreased nausea in the last 3 days. Declines genetic testing.  FHT 172. BP 120/70 wt 213 3/4# (no change)  A: IUP at 10wk6d  P: RTO in 4 weeks for ROB  Farrel Conners, CNM

## 2019-08-13 ENCOUNTER — Ambulatory Visit
Admission: EM | Admit: 2019-08-13 | Discharge: 2019-08-13 | Disposition: A | Discharge status: Home / Self Care | Payer: No Typology Code available for payment source | Attending: Internal Medicine | Admitting: Internal Medicine

## 2019-08-13 ENCOUNTER — Other Ambulatory Visit: Payer: Self-pay

## 2019-08-13 DIAGNOSIS — Z3A12 12 weeks gestation of pregnancy: Secondary | ICD-10-CM | POA: Insufficient documentation

## 2019-08-13 DIAGNOSIS — Z79899 Other long term (current) drug therapy: Secondary | ICD-10-CM | POA: Diagnosis not present

## 2019-08-13 DIAGNOSIS — O26891 Other specified pregnancy related conditions, first trimester: Secondary | ICD-10-CM | POA: Insufficient documentation

## 2019-08-13 DIAGNOSIS — J309 Allergic rhinitis, unspecified: Secondary | ICD-10-CM | POA: Diagnosis not present

## 2019-08-13 DIAGNOSIS — M545 Low back pain: Secondary | ICD-10-CM | POA: Insufficient documentation

## 2019-08-13 DIAGNOSIS — U071 COVID-19: Secondary | ICD-10-CM | POA: Insufficient documentation

## 2019-08-13 DIAGNOSIS — R0982 Postnasal drip: Secondary | ICD-10-CM | POA: Insufficient documentation

## 2019-08-13 DIAGNOSIS — R438 Other disturbances of smell and taste: Secondary | ICD-10-CM | POA: Insufficient documentation

## 2019-08-13 MED ORDER — CETIRIZINE HCL 10 MG PO TABS
10.0000 mg | ORAL_TABLET | Freq: Every day | ORAL | 2 refills | Status: DC
Start: 2019-08-13 — End: 2019-10-23

## 2019-08-13 MED ORDER — SALINE SPRAY 0.65 % NA SOLN
1.0000 | NASAL | 0 refills | Status: DC | PRN
Start: 2019-08-13 — End: 2019-10-23

## 2019-08-13 MED ORDER — FLUTICASONE PROPIONATE 50 MCG/ACT NA SUSP
1.0000 | Freq: Every day | NASAL | 0 refills | Status: DC
Start: 2019-08-13 — End: 2019-08-28

## 2019-08-13 NOTE — ED Triage Notes (Signed)
Pt reports congestion and loss of taste and smell. [redacted] weeks pregnant. Also having sciatica.

## 2019-08-14 LAB — SARS CORONAVIRUS 2 (TAT 6-24 HRS): SARS Coronavirus 2: POSITIVE — AB

## 2019-08-15 ENCOUNTER — Telehealth: Payer: Self-pay | Admitting: Nurse Practitioner

## 2019-08-15 ENCOUNTER — Telehealth: Payer: Self-pay | Admitting: Physician Assistant

## 2019-08-15 NOTE — Telephone Encounter (Signed)
Called to discuss with patient about Covid symptoms and the use of bamlanivimab/etesevimab or casirivimab/imdevimab, a monoclonal antibody infusion for those with mild to moderate Covid symptoms and at a high risk of hospitalization.  Pt is qualified for this infusion at the [facility] due to pregnancy. She would also need clearance from OB/GYN.    Message left to call back our hotline [phone].  Cline Crock PA-C  [facility]

## 2019-08-15 NOTE — ED Provider Notes (Signed)
[facility]    CSN: [id number] Arrival date & time: 08/13/19  1330      History   Chief Complaint Chief Complaint  Patient presents with  . Nasal Congestion  . Sciatica    HPI Brenda Villegas is a 29 y.o. female comes to urgent care with complaints of nasal congestion, loss of taste and smell of a few days duration.  Patient also complains of lower back pain.  Pain is sharp, 6 out of 10, aggravated by assuming certain positions and partially relieved with Tylenol.  No numbness or tingling or weakness in the lower extremities.  No trauma.  Patient is currently [redacted] weeks pregnant.Marland Kitchen   HPI  Past Medical History:  Diagnosis Date  . Allergy   . Hyperthyroidism 08/07/2014    Patient Active Problem List   Diagnosis Date Noted  . Supervision of high risk pregnancy, antepartum 06/28/2019  . H/O Graves' disease 06/28/2019  . Rh negative state in antepartum period 06/28/2019  . Depression 08/30/2014  . Hyperthyroidism 08/07/2014    Past Surgical History:  Procedure Laterality Date  . KNEE SURGERY Right 2006  . TONSILLECTOMY     At age 72    OB History    Gravida  2   Para  1   Term  1   Preterm      AB      Living  1     SAB      TAB      Ectopic      Multiple      Live Births  1            Home Medications    Prior to Admission medications   Medication Sig Start Date End Date Taking? Authorizing Provider  Prenatal Vit-Fe Fumarate-FA (PRENATAL MULTIVITAMIN) TABS tablet Take 1 tablet by mouth daily at 12 noon.   Yes [provider]  cetirizine (ZYRTEC ALLERGY) 10 MG tablet Take 1 tablet (10 mg total) by mouth daily. 08/13/19   LampteyBritta Mccreedy, MD  sodium chloride (OCEAN) 0.65 % SOLN nasal spray Place 1 spray into both nostrils as needed for congestion. 08/13/19   Merrilee Jansky, MD  fluticasone (FLONASE) 50 MCG/ACT nasal spray Place 1 spray into both nostrils daily. 08/13/19 08/13/19  Merrilee Jansky, MD    Family  History Family History  Problem Relation Age of Onset  . Ovarian cysts Mother        removed cyst on one side in 2018  . Cervical cancer Maternal Aunt   . Thyroid cancer Maternal Aunt   . Breast cancer Paternal Aunt        ? age    Social History Social History   Tobacco Use  . Smoking status: Never Smoker  . Smokeless tobacco: Never Used  Vaping Use  . Vaping Use: Some days  Substance Use Topics  . Alcohol use: No    Alcohol/week: 0.0 standard drinks  . Drug use: No     Allergies   Penicillins   Review of Systems Review of Systems  Constitutional: Negative for activity change, diaphoresis and fever.  HENT: Positive for congestion. Negative for ear discharge, ear pain, sinus pressure, sinus pain and sore throat.   Respiratory: Negative.   Gastrointestinal: Negative for abdominal distention, abdominal pain, nausea and vomiting.  Musculoskeletal: Positive for arthralgias and back pain. Negative for myalgias, neck pain and neck stiffness.  Skin: Negative.      Physical Exam Triage Vital  Signs ED Triage Vitals  Enc Vitals Group     BP 08/13/19 1350 116/68     Pulse Rate 08/13/19 1350 95     Resp 08/13/19 1350 18     Temp 08/13/19 1350 98.4 F (36.9 C)     Temp Source 08/13/19 1350 Oral     SpO2 08/13/19 1350 100 %     Weight 08/13/19 1350 213 lb (96.6 kg)     Height 08/13/19 1350 5\' 8"  (1.727 m)     Head Circumference --      Peak Flow --      Pain Score 08/13/19 1349 6     Pain Loc --      Pain Edu? --      Excl. in GC? --    No data found.  Updated Vital Signs BP 116/68 (BP Location: Left Arm)   Pulse 95   Temp 98.4 F (36.9 C) (Oral)   Resp 18   Ht 5\' 8"  (1.727 m)   Wt 96.6 kg   LMP 04/23/2019 (LMP Unknown)   SpO2 100%   BMI 32.39 kg/m   Visual Acuity Right Eye Distance:   Left Eye Distance:   Bilateral Distance:    Right Eye Near:   Left Eye Near:    Bilateral Near:     Physical Exam HENT:     Right Ear: Tympanic membrane normal.      Left Ear: Tympanic membrane normal.  Cardiovascular:     Rate and Rhythm: Normal rate and regular rhythm.     Pulses: Normal pulses.     Heart sounds: Normal heart sounds.  Pulmonary:     Effort: Pulmonary effort is normal. No respiratory distress.     Breath sounds: Normal breath sounds. No wheezing or rhonchi.  Abdominal:     General: Bowel sounds are normal.     Palpations: Abdomen is soft.  Musculoskeletal:        General: Tenderness present. No swelling, deformity or signs of injury. Normal range of motion.  Skin:    General: Skin is warm.     Capillary Refill: Capillary refill takes less than 2 seconds.      UC Treatments / Results  Labs (all labs ordered are listed, but only abnormal results are displayed) Labs Reviewed  SARS CORONAVIRUS 2 (TAT 6-24 HRS) - Abnormal; Notable for the following components:      Result Value   SARS Coronavirus 2 POSITIVE (*)    All other components within normal limits    EKG   Radiology No results found.  Procedures Procedures (including critical care time)  Medications Ordered in UC Medications - No data to display  Initial Impression / Assessment and Plan / UC Course  I have reviewed the triage vital signs and the nursing notes.  Pertinent labs & imaging results that were available during my care of the patient were reviewed by me and considered in my medical decision making (see chart for details).     1.  Upper respiratory infection symptoms likely COVID-19 infection: COVID-19 PCR sent Patient is advised to self quarantine until COVID-19 test results are available Saline nasal spray Zyrtec 10 mg orally daily If patient's condition worsens she is advised to come to the urgent care to be reevaluated.  2.  Low back pain likely musculoskeletal: Tylenol as needed for pain Heating pad Gentle range of motion exercises Return to urgent care if pain is worse No indication for imaging at this time. Final  Clinical  Impressions(s) / UC Diagnoses   Final diagnoses:  Allergic rhinitis with postnasal drip   Discharge Instructions   None    ED Prescriptions    Medication Sig Dispense Auth. Provider   fluticasone (FLONASE) 50 MCG/ACT nasal spray  (Status: Discontinued) Place 1 spray into both nostrils daily. 16 g Merrilee Jansky, MD   cetirizine (ZYRTEC ALLERGY) 10 MG tablet Take 1 tablet (10 mg total) by mouth daily. 30 tablet Jodiann Ognibene, Britta Mccreedy, MD   sodium chloride (OCEAN) 0.65 % SOLN nasal spray Place 1 spray into both nostrils as needed for congestion. 66 mL Lilyona Richner, Britta Mccreedy, MD     PDMP not reviewed this encounter.   Merrilee Jansky, MD 08/15/19 1359

## 2019-08-15 NOTE — Telephone Encounter (Signed)
Called to discuss with patient about Covid symptoms and the use of bamlanivimab/etesevimab or casirivimab/imdevimab, a monoclonal antibody infusion for those with mild to moderate Covid symptoms and at a high risk of hospitalization.  Pt is qualified for this infusion at the [facility] due to pregnancy. She would also need clearance from OB/GYN.    Message left to call back our hotline [phone].  Angus Seller, FNP-C

## 2019-08-20 ENCOUNTER — Telehealth: Payer: Self-pay

## 2019-08-20 NOTE — Telephone Encounter (Signed)
Pt calling; tested positive for covid last Mon; for the past 3d hasn't been able to keep food or fluids down. What to do?  [phone]  Pt states is is able to keep certain fluids down.  Adv to sip on those fluids, freeze fluids and hold in mouth and let it melt, hold ice in mouth, popsicles/icy pops for 24hrs; then try bland food for 24hrs then work up to reg diet x24hrs.  Pt aware if not able to keep fluids down for 24hrs to be seen.

## 2019-08-28 ENCOUNTER — Other Ambulatory Visit: Payer: Self-pay

## 2019-08-28 ENCOUNTER — Ambulatory Visit (INDEPENDENT_AMBULATORY_CARE_PROVIDER_SITE_OTHER): Payer: No Typology Code available for payment source | Admitting: Obstetrics

## 2019-08-28 VITALS — BP 116/70 | Ht 68.0 in | Wt 203.6 lb

## 2019-08-28 DIAGNOSIS — O0990 Supervision of high risk pregnancy, unspecified, unspecified trimester: Secondary | ICD-10-CM

## 2019-08-28 DIAGNOSIS — Z3A14 14 weeks gestation of pregnancy: Secondary | ICD-10-CM

## 2019-08-28 DIAGNOSIS — U071 COVID-19: Secondary | ICD-10-CM

## 2019-08-28 NOTE — Progress Notes (Signed)
Routine Prenatal Care Visit  Subjective  Brenda Villegas is a 29 y.o. G2P1001 at [redacted]w[redacted]d being seen today for ongoing prenatal care.  She is currently monitored for the following issues for this low-risk pregnancy and has Hyperthyroidism; Depression; Supervision of high risk pregnancy, antepartum; H/O Graves' disease; and Rh negative state in antepartum period on their problem list.  ----------------------------------------------------------------------------------- Patient reports no complaints.  She tested positive for  COVID 2 weeks ago, but had a negative test yesterday. Feeling better, with less nausea.  . Vag. Bleeding: None.   . Leaking Fluid denies.  ----------------------------------------------------------------------------------- The following portions of the patient's history were reviewed and updated as appropriate: allergies, current medications, past family history, past medical history, past social history, past surgical history and problem list. Problem list updated.  Objective  Blood pressure 116/70, height 5\' 8"  (1.727 m), weight 203 lb 9.6 oz (92.4 kg), last menstrual period 04/23/2019. Pregravid weight 197 lb (89.4 kg) Total Weight Gain 6 lb 9.6 oz (2.994 kg) Urinalysis: Urine Protein    Urine Glucose    Fetal Status: Fetal Heart Rate (bpm): 154         General:  Alert, oriented and cooperative. Patient is in no acute distress.  Skin: Skin is warm and dry. No rash noted.   Cardiovascular: Normal heart rate noted  Respiratory: Normal respiratory effort, no problems with respiration noted  Abdomen: Soft, gravid, appropriate for gestational age. Pain/Pressure: Absent     Pelvic:  Cervical exam deferred        Extremities: Normal range of motion.  Edema: None  Mental Status: Normal mood and affect. Normal behavior. Normal judgment and thought content.   Assessment   29 y.o. G2P1001 at [redacted]w[redacted]d by  02/20/2020, by Ultrasound presenting for routine prenatal visit  Plan    Pregnancy#2 Problems (from 04/23/19 to present)    Problem Noted Resolved   Supervision of high risk pregnancy, antepartum 06/28/2019 by 06/30/2019, MD No   Overview Addendum 07/03/2019  5:01 PM by 07/05/2019, MD    Clinic [facility] Prenatal Labs  Dating 6 week ultrasound Blood type: O/Negative/-- (05/20 1121)   Genetic Screen Declines AFP: [ ]  Antibody:Negative (05/20 1121)  Anatomic  Rubella: 3.58 (05/20 1121)  Varicella: Immune  GTT Early:               Third trimester:  RPR: Non Reactive (05/20 1121)   Rhogam [ ]  28 weeks HBsAg: Negative (05/20 1121)   TDaP vaccine                       Flu Shot: Covid: has not had HIV: Non Reactive (05/20 1121)   Baby Food                                GBS:   Contraception  Pap: 10/06/2017 NILM  CBB     CS/VBAC    Support Person            Previous Version   H/O Graves' disease 06/28/2019 by 08-05-1983, MD No   Overview Signed 07/03/2019  5:01 PM by 06/30/2019, MD    - on no meds - normal TFTs at NOB. TSI negative at NOB      Rh negative state in antepartum period 06/28/2019 by 07/05/2019, MD No       Preterm labor symptoms and general obstetric precautions including  but not limited to vaginal bleeding, contractions, leaking of fluid and fetal movement were reviewed in detail with the patient. Please refer to After Visit Summary for other counseling recommendations.   Return in about 4 weeks (around 09/25/2019) for return OB and anatomy ultrasound.  Mirna Mires, CNM  08/28/2019 10:40 AM

## 2019-09-25 ENCOUNTER — Other Ambulatory Visit: Payer: Self-pay

## 2019-09-25 ENCOUNTER — Ambulatory Visit (INDEPENDENT_AMBULATORY_CARE_PROVIDER_SITE_OTHER): Payer: No Typology Code available for payment source | Admitting: Obstetrics and Gynecology

## 2019-09-25 ENCOUNTER — Ambulatory Visit (INDEPENDENT_AMBULATORY_CARE_PROVIDER_SITE_OTHER): Payer: No Typology Code available for payment source

## 2019-09-25 VITALS — BP 122/74 | Wt 213.0 lb

## 2019-09-25 DIAGNOSIS — O26892 Other specified pregnancy related conditions, second trimester: Secondary | ICD-10-CM

## 2019-09-25 DIAGNOSIS — O0992 Supervision of high risk pregnancy, unspecified, second trimester: Secondary | ICD-10-CM | POA: Diagnosis not present

## 2019-09-25 DIAGNOSIS — Z3A18 18 weeks gestation of pregnancy: Secondary | ICD-10-CM

## 2019-09-25 DIAGNOSIS — O26899 Other specified pregnancy related conditions, unspecified trimester: Secondary | ICD-10-CM

## 2019-09-25 DIAGNOSIS — Z6791 Unspecified blood type, Rh negative: Secondary | ICD-10-CM

## 2019-09-25 DIAGNOSIS — Z3A14 14 weeks gestation of pregnancy: Secondary | ICD-10-CM | POA: Diagnosis not present

## 2019-09-25 DIAGNOSIS — O0990 Supervision of high risk pregnancy, unspecified, unspecified trimester: Secondary | ICD-10-CM

## 2019-09-25 NOTE — Progress Notes (Signed)
ROB  °Anatomy scan °

## 2019-09-25 NOTE — Progress Notes (Signed)
Routine Prenatal Care Visit  Subjective  Brenda Villegas is a 29 y.o. G2P1001 at [redacted]w[redacted]d being seen today for ongoing prenatal care.  She is currently monitored for the following issues for this low-risk pregnancy and has Hyperthyroidism; Depression; Supervision of high risk pregnancy, antepartum; H/O Graves' disease; and Rh negative state in antepartum period on their problem list.  ----------------------------------------------------------------------------------- Patient reports no complaints.   Contractions: Not present. Vag. Bleeding: None.  Movement: Present. Denies leaking of fluid.  ----------------------------------------------------------------------------------- The following portions of the patient's history were reviewed and updated as appropriate: allergies, current medications, past family history, past medical history, past social history, past surgical history and problem list. Problem list updated.   Objective  Last menstrual period 04/23/2019. Pregravid weight 197 lb (89.4 kg) Total Weight Gain 6 lb 9.6 oz (2.994 kg) Urinalysis:      Fetal Status: Fetal Heart Rate (bpm): 145   Movement: Present     General:  Alert, oriented and cooperative. Patient is in no acute distress.  Skin: Skin is warm and dry. No rash noted.   Cardiovascular: Normal heart rate noted  Respiratory: Normal respiratory effort, no problems with respiration noted  Abdomen: Soft, gravid, appropriate for gestational age. Pain/Pressure: Absent     Pelvic:  Cervical exam deferred        Extremities: Normal range of motion.     ental Status: Normal mood and affect. Normal behavior. Normal judgment and thought content.   US OB Comp + 14 Wk  Result Date: 09/25/2019 Patient Name: Brenda Villegas DOB: November 21, 1990 MRN: [id number] ULTRASOUND REPORT Location: [facility] Date of Service: 09/25/2019 Indications:Anatomy Ultrasound Findings: Mason Jim intrauterine pregnancy is visualized with FHR at 169  BPM. Biometrics give an (U/S) Gestational age of [redacted]w[redacted]d and an (U/S) EDD of 02/16/2020; this correlates with the clinically established Estimated Date of Delivery: 02/20/20 Fetal presentation is Breech. EFW: 295 g ( 10 oz ). Placenta: anterior. Grade: 1 AFI: subjectively normal. Anatomic survey is complete and normal; Gender - female.  Impression: 1. [redacted]w[redacted]d Viable Singleton Intrauterine pregnancy by U/S. 2. (U/S) EDD is consistent with Clinically established Estimated Date of Delivery: 02/20/20 . 3. Normal Anatomy Scan Recommendations: 1.Clinical correlation with the patient's History and Physical Exam. Deanna Artis, RT  There is a singleton gestation with subjectively normal amniotic fluid volume. The fetal biometry correlates with established dating. Detailed evaluation of the fetal anatomy was performed.The fetal anatomical survey appears within normal limits within the resolution of ultrasound as described above.  It must be noted that a normal ultrasound is unable to rule out fetal aneuploidy, subtle defects such as small ASD or VDS may also not be visible on imaging.  Vena Austria, MD, [facility], [facility] 09/25/2019, 4:19 PM     Assessment   30 y.o. G2P1001 at [redacted]w[redacted]d by  02/20/2020, by Ultrasound presenting for routine prenatal visit  Plan   Pregnancy#2 Problems (from 04/23/19 to present)    Problem Noted Resolved   Supervision of high risk pregnancy, antepartum 06/28/2019 by Vena Austria, MD No   Overview Addendum 07/03/2019  5:01 PM by Conard Novak, MD    [facility]inic [facility] Prenatal Labs  Dating 6 week ultrasound Blood type: O/Negative/-- (05/20 1121)   Genetic Screen Declines AFP: [ ]  Antibody:Negative (05/20 1121)  Anatomic [birth year]1983  Rubella: 3.58 (05/20 1121)  Varicella: Immune  GTT Early:               Third trimester:  RPR: Non Reactive (05/20 1121)   Rhogam [ ]  28 weeks HBsAg: Negative (05/20 1121)   TDaP vaccine                       Flu Shot: Covid:  has not had HIV: Non Reactive (05/20 1121)   Baby Food                                GBS:   Contraception  Pap: 10/06/2017 NILM  CBB     CS/VBAC    Support Person            Previous Version   H/O Graves' disease 06/28/2019 by 06/30/2019, MD No   Overview Signed 07/03/2019  5:01 PM by 07/05/2019, MD    - on no meds - normal TFTs at NOB. TSI negative at NOB      Rh negative state in antepartum period 06/28/2019 by 06/30/2019, MD No       Gestational age appropriate obstetric precautions including but not limited to vaginal bleeding, contractions, leaking of fluid and fetal movement were reviewed in detail with the patient.    Return in about 4 weeks (around 10/23/2019) for ROB.  10/25/2019, MD, [facility], [facility] 09/25/2019, 4:20 PM

## 2019-10-23 ENCOUNTER — Ambulatory Visit (INDEPENDENT_AMBULATORY_CARE_PROVIDER_SITE_OTHER): Payer: No Typology Code available for payment source | Admitting: Obstetrics

## 2019-10-23 ENCOUNTER — Other Ambulatory Visit: Payer: Self-pay

## 2019-10-23 VITALS — Wt 219.0 lb

## 2019-10-23 DIAGNOSIS — O0990 Supervision of high risk pregnancy, unspecified, unspecified trimester: Secondary | ICD-10-CM

## 2019-10-23 DIAGNOSIS — Z3A22 22 weeks gestation of pregnancy: Secondary | ICD-10-CM | POA: Diagnosis not present

## 2019-10-23 LAB — POCT URINALYSIS DIPSTICK OB
Glucose, UA: NEGATIVE
POC,PROTEIN,UA: NEGATIVE

## 2019-10-23 NOTE — Progress Notes (Signed)
Routine Prenatal Care Visit  Subjective  Brenda Villegas is a 29 y.o. G2P1001 at [redacted]w[redacted]d being seen today for ongoing prenatal care.  She is currently monitored for the following issues for this high-risk pregnancy and has Hyperthyroidism; Depression; Supervision of high risk pregnancy, antepartum; H/O Graves' disease; and Rh negative state in antepartum period on their problem list.  ----------------------------------------------------------------------------------- Patient reports no complaints.    .  .   Pincus Large Fluid denies.  ----------------------------------------------------------------------------------- The following portions of the patient's history were reviewed and updated as appropriate: allergies, current medications, past family history, past medical history, past social history, past surgical history and problem list. Problem list updated.  Objective  Weight 219 lb (99.3 kg), last menstrual period 04/23/2019. Pregravid weight 197 lb (89.4 kg) Total Weight Gain 22 lb (9.979 kg) Urinalysis: Urine Protein Negative  Urine Glucose Negative  Fetal Status:           General:  Alert, oriented and cooperative. Patient is in no acute distress.  Skin: Skin is warm and dry. No rash noted.   Cardiovascular: Normal heart rate noted  Respiratory: Normal respiratory effort, no problems with respiration noted  Abdomen: Soft, gravid, appropriate for gestational age.       Pelvic:  Cervical exam deferred        Extremities: Normal range of motion.     Mental Status: Normal mood and affect. Normal behavior. Normal judgment and thought content.   Assessment   29 y.o. G2P1001 at [redacted]w[redacted]d by  02/20/2020, by Ultrasound presenting for routine prenatal visit  Plan   Pregnancy#2 Problems (from 04/23/19 to present)    Problem Noted Resolved   Supervision of high risk pregnancy, antepartum 06/28/2019 by Vena Austria, MD No   Overview Addendum 07/03/2019  5:01 PM by Conard Novak, MD     Clinic [facility] Prenatal Labs  Dating 6 week ultrasound Blood type: O/Negative/-- (05/20 1121)   Genetic Screen Declines AFP: [ ]  Antibody:Negative (05/20 1121)  Anatomic 08[birth year]1983  Rubella: 3.58 (05/20 1121)  Varicella: Immune  GTT Early:               Third trimester:  RPR: Non Reactive (05/20 1121)   Rhogam [ ]  28 weeks HBsAg: Negative (05/20 1121)   TDaP vaccine                       Flu Shot: Covid: has not had HIV: Non Reactive (05/20 1121)   Baby Food                                GBS:   Contraception  Pap: 10/06/2017 NILM  CBB     CS/VBAC    Support Person            Previous Version   H/O Graves' disease 06/28/2019 by 10/08/2017, MD No   Overview Signed 07/03/2019  5:01 PM by Vena Austria, MD    - on no meds - normal TFTs at NOB. TSI negative at NOB      Rh negative state in antepartum period 06/28/2019 by Conard Novak, MD No       Preterm labor symptoms and general obstetric precautions including but not limited to vaginal bleeding, contractions, leaking of fluid and fetal movement were reviewed in detail with the patient. Please refer to After Visit Summary for other counseling recommendations.   Return in about 4 weeks (  around 11/20/2019) for return OB.  Mirna Mires, CNM  10/23/2019 4:22 PM

## 2019-10-23 NOTE — Progress Notes (Signed)
ROB

## 2019-11-20 ENCOUNTER — Ambulatory Visit (INDEPENDENT_AMBULATORY_CARE_PROVIDER_SITE_OTHER): Payer: No Typology Code available for payment source | Admitting: Obstetrics & Gynecology

## 2019-11-20 ENCOUNTER — Other Ambulatory Visit: Payer: Self-pay

## 2019-11-20 VITALS — BP 124/72 | Wt 226.0 lb

## 2019-11-20 DIAGNOSIS — Z3A26 26 weeks gestation of pregnancy: Secondary | ICD-10-CM

## 2019-11-20 DIAGNOSIS — O0992 Supervision of high risk pregnancy, unspecified, second trimester: Secondary | ICD-10-CM

## 2019-11-20 LAB — POCT URINALYSIS DIPSTICK OB
Glucose, UA: NEGATIVE
POC,PROTEIN,UA: NEGATIVE

## 2019-11-20 NOTE — Progress Notes (Signed)
  Subjective  Fetal Movement? yes Contractions? no Leaking Fluid? no Vaginal Bleeding? no  Objective  BP 124/72   Wt 226 lb (102.5 kg)   LMP 04/23/2019 (LMP Unknown)   BMI 34.36 kg/m  General: NAD Pumonary: no increased work of breathing Abdomen: gravid, non-tender Extremities: no edema Psychiatric: mood appropriate, affect full  Assessment  29 y.o. G2P1001 at [redacted]w[redacted]d by  02/20/2020, by Ultrasound presenting for routine prenatal visit  Plan   Problem List Items Addressed This Visit      Other   Supervision of high risk pregnancy, antepartum    Other Visit Diagnoses    [redacted] weeks gestation of pregnancy    -  Primary   Relevant Orders   POC Urinalysis Dipstick OB (Completed)   28 Weeks RH-Panel      Pregnancy#2 Problems (from 04/23/19 to present)    Problem Noted Resolved   Supervision of high risk pregnancy, antepartum 06/28/2019 by Vena Austria, MD No   Overview Addendum 07/03/2019  5:01 PM by Conard Novak, MD    [facility]inic [facility] Prenatal Labs  Dating 6 week ultrasound Blood type: O/Negative/-- (05/20 1121)   Genetic Screen Declines  Antibody:Negative (05/20 1121)  Anatomic [place] [facility] nml Rubella: 3.58 (05/20 1121)  Varicella: Immune  GTT  Third trimester:  RPR: Non Reactive (05/20 1121)   Rhogam [ ]  28 weeks HBsAg: Negative (05/20 1121)   TDaP vaccine                       Flu Shot: Covid: has not had HIV: Non Reactive (05/20 1121)   Baby Food                                GBS:   Contraception  Pap: 10/06/2017 NILM  CBB  no   CS/VBAC no   Support Person            Previous Version   H/O Graves' disease 06/28/2019 by 06/30/2019, MD No   Overview Signed 07/03/2019  5:01 PM by 07/05/2019, MD    - on no meds - normal TFTs at NOB. TSI negative at NOB      Rh negative state in antepartum period 06/28/2019 by 06/30/2019, MD No     PNV   Glc nv  Vena Austria, MD, [facility], [facility] 11/20/2019  4:50 PM

## 2019-11-20 NOTE — Patient Instructions (Signed)
Third Trimester of Pregnancy The third trimester is from week 28 through week 40 (months 7 through 9). The third trimester is a time when the unborn baby (fetus) is growing rapidly. At the end of the ninth month, the fetus is about 20 inches in length and weighs 6-10 pounds. Body changes during your third trimester Your body will continue to go through many changes during pregnancy. The changes vary from woman to woman. During the third trimester:  Your weight will continue to increase. You can expect to gain 25-35 pounds (11-16 kg) by the end of the pregnancy.  You may begin to get stretch marks on your hips, abdomen, and breasts.  You may urinate more often because the fetus is moving lower into your pelvis and pressing on your bladder.  You may develop or continue to have heartburn. This is caused by increased hormones that slow down muscles in the digestive tract.  You may develop or continue to have constipation because increased hormones slow digestion and cause the muscles that push waste through your intestines to relax.  You may develop hemorrhoids. These are swollen veins (varicose veins) in the rectum that can itch or be painful.  You may develop swollen, bulging veins (varicose veins) in your legs.  You may have increased body aches in the pelvis, back, or thighs. This is due to weight gain and increased hormones that are relaxing your joints.  You may have changes in your hair. These can include thickening of your hair, rapid growth, and changes in texture. Some women also have hair loss during or after pregnancy, or hair that feels dry or thin. Your hair will most likely return to normal after your baby is born.  Your breasts will continue to grow and they will continue to become tender. A yellow fluid (colostrum) may leak from your breasts. This is the first milk you are producing for your baby.  Your belly button may stick out.  You may notice more swelling in your hands,  face, or ankles.  You may have increased tingling or numbness in your hands, arms, and legs. The skin on your belly may also feel numb.  You may feel short of breath because of your expanding uterus.  You may have more problems sleeping. This can be caused by the size of your belly, increased need to urinate, and an increase in your body's metabolism.  You may notice the fetus "dropping," or moving lower in your abdomen (lightening).  You may have increased vaginal discharge.  You may notice your joints feel loose and you may have pain around your pelvic bone. What to expect at prenatal visits You will have prenatal exams every 2 weeks until week 36. Then you will have weekly prenatal exams. During a routine prenatal visit:  You will be weighed to make sure you and the baby are growing normally.  Your blood pressure will be taken.  Your abdomen will be measured to track your baby's growth.  The fetal heartbeat will be listened to.  Any test results from the previous visit will be discussed.  You may have a cervical check near your due date to see if your cervix has softened or thinned (effaced).  You will be tested for Group B streptococcus. This happens between 35 and 37 weeks. Your health care provider may ask you:  What your birth plan is.  How you are feeling.  If you are feeling the baby move.  If you have had any abnormal   symptoms, such as leaking fluid, bleeding, severe headaches, or abdominal cramping.  If you are using any tobacco products, including cigarettes, chewing tobacco, and electronic cigarettes.  If you have any questions. Other tests or screenings that may be performed during your third trimester include:  Blood tests that check for low iron levels (anemia).  Fetal testing to check the health, activity level, and growth of the fetus. Testing is done if you have certain medical conditions or if there are problems during the pregnancy.  Nonstress test  (NST). This test checks the health of your baby to make sure there are no signs of problems, such as the baby not getting enough oxygen. During this test, a belt is placed around your belly. The baby is made to move, and its heart rate is monitored during movement. What is false labor? False labor is a condition in which you feel small, irregular tightenings of the muscles in the womb (contractions) that usually go away with rest, changing position, or drinking water. These are called Braxton Hicks contractions. Contractions may last for hours, days, or even weeks before true labor sets in. If contractions come at regular intervals, become more frequent, increase in intensity, or become painful, you should see your health care provider. What are the signs of labor?  Abdominal cramps.  Regular contractions that start at 10 minutes apart and become stronger and more frequent with time.  Contractions that start on the top of the uterus and spread down to the lower abdomen and back.  Increased pelvic pressure and dull back pain.  A watery or bloody mucus discharge that comes from the vagina.  Leaking of amniotic fluid. This is also known as your "water breaking." It could be a slow trickle or a gush. Let your health care provider know if it has a color or strange odor. If you have any of these signs, call your health care provider right away, even if it is before your due date. Follow these instructions at home: Medicines  Follow your health care provider's instructions regarding medicine use. Specific medicines may be either safe or unsafe to take during pregnancy.  Take a prenatal vitamin that contains at least 600 micrograms (mcg) of folic acid.  If you develop constipation, try taking a stool softener if your health care provider approves. Eating and drinking   Eat a balanced diet that includes fresh fruits and vegetables, whole grains, good sources of protein such as meat, eggs, or tofu,  and low-fat dairy. Your health care provider will help you determine the amount of weight gain that is right for you.  Avoid raw meat and uncooked cheese. These carry germs that can cause birth defects in the baby.  If you have low calcium intake from food, talk to your health care provider about whether you should take a daily calcium supplement.  Eat four or five small meals rather than three large meals a day.  Limit foods that are high in fat and processed sugars, such as fried and sweet foods.  To prevent constipation: ? Drink enough fluid to keep your urine clear or pale yellow. ? Eat foods that are high in fiber, such as fresh fruits and vegetables, whole grains, and beans. Activity  Exercise only as directed by your health care provider. Most women can continue their usual exercise routine during pregnancy. Try to exercise for 30 minutes at least 5 days a week. Stop exercising if you experience uterine contractions.  Avoid heavy lifting.  Do   not exercise in extreme heat or humidity, or at high altitudes.  Wear low-heel, comfortable shoes.  Practice good posture.  You may continue to have sex unless your health care provider tells you otherwise. Relieving pain and discomfort  Take frequent breaks and rest with your legs elevated if you have leg cramps or low back pain.  Take warm sitz baths to soothe any pain or discomfort caused by hemorrhoids. Use hemorrhoid cream if your health care provider approves.  Wear a good support bra to prevent discomfort from breast tenderness.  If you develop varicose veins: ? Wear support pantyhose or compression stockings as told by your healthcare provider. ? Elevate your feet for 15 minutes, 3-4 times a day. Prenatal care  Write down your questions. Take them to your prenatal visits.  Keep all your prenatal visits as told by your health care provider. This is important. Safety  Wear your seat belt at all times when driving.  Make  a list of emergency phone numbers, including numbers for family, friends, the hospital, and police and fire departments. General instructions  Avoid cat litter boxes and soil used by cats. These carry germs that can cause birth defects in the baby. If you have a cat, ask someone to clean the litter box for you.  Do not travel far distances unless it is absolutely necessary and only with the approval of your health care provider.  Do not use hot tubs, steam rooms, or saunas.  Do not drink alcohol.  Do not use any products that contain nicotine or tobacco, such as cigarettes and e-cigarettes. If you need help quitting, ask your health care provider.  Do not use any medicinal herbs or unprescribed drugs. These chemicals affect the formation and growth of the baby.  Do not douche or use tampons or scented sanitary pads.  Do not cross your legs for long periods of time.  To prepare for the arrival of your baby: ? Take prenatal classes to understand, practice, and ask questions about labor and delivery. ? Make a trial run to the hospital. ? Visit the hospital and tour the maternity area. ? Arrange for maternity or paternity leave through employers. ? Arrange for family and friends to take care of pets while you are in the hospital. ? Purchase a rear-facing car seat and make sure you know how to install it in your car. ? Pack your hospital bag. ? Prepare the baby's nursery. Make sure to remove all pillows and stuffed animals from the baby's crib to prevent suffocation.  Visit your dentist if you have not gone during your pregnancy. Use a soft toothbrush to brush your teeth and be gentle when you floss. Contact a health care provider if:  You are unsure if you are in labor or if your water has broken.  You become dizzy.  You have mild pelvic cramps, pelvic pressure, or nagging pain in your abdominal area.  You have lower back pain.  You have persistent nausea, vomiting, or  diarrhea.  You have an unusual or bad smelling vaginal discharge.  You have pain when you urinate. Get help right away if:  Your water breaks before 37 weeks.  You have regular contractions less than 5 minutes apart before 37 weeks.  You have a fever.  You are leaking fluid from your vagina.  You have spotting or bleeding from your vagina.  You have severe abdominal pain or cramping.  You have rapid weight loss or weight gain.  You have   shortness of breath with chest pain.  You notice sudden or extreme swelling of your face, hands, ankles, feet, or legs.  Your baby makes fewer than 10 movements in 2 hours.  You have severe headaches that do not go away when you take medicine.  You have vision changes. Summary  The third trimester is from week 28 through week 40, months 7 through 9. The third trimester is a time when the unborn baby (fetus) is growing rapidly.  During the third trimester, your discomfort may increase as you and your baby continue to gain weight. You may have abdominal, leg, and back pain, sleeping problems, and an increased need to urinate.  During the third trimester your breasts will keep growing and they will continue to become tender. A yellow fluid (colostrum) may leak from your breasts. This is the first milk you are producing for your baby.  False labor is a condition in which you feel small, irregular tightenings of the muscles in the womb (contractions) that eventually go away. These are called Braxton Hicks contractions. Contractions may last for hours, days, or even weeks before true labor sets in.  Signs of labor can include: abdominal cramps; regular contractions that start at 10 minutes apart and become stronger and more frequent with time; watery or bloody mucus discharge that comes from the vagina; increased pelvic pressure and dull back pain; and leaking of amniotic fluid. This information is not intended to replace advice given to you by your  health care provider. Make sure you discuss any questions you have with your health care provider. Document Revised: 05/18/2018 Document Reviewed: 03/02/2016 Elsevier Patient Education  2020 [employer]  

## 2019-12-05 ENCOUNTER — Ambulatory Visit (INDEPENDENT_AMBULATORY_CARE_PROVIDER_SITE_OTHER): Payer: No Typology Code available for payment source | Admitting: Advanced Practice Midwife

## 2019-12-05 ENCOUNTER — Other Ambulatory Visit: Payer: Self-pay

## 2019-12-05 ENCOUNTER — Encounter: Payer: Self-pay | Admitting: Advanced Practice Midwife

## 2019-12-05 ENCOUNTER — Other Ambulatory Visit: Payer: No Typology Code available for payment source

## 2019-12-05 VITALS — BP 126/74 | Wt 229.0 lb

## 2019-12-05 DIAGNOSIS — O0993 Supervision of high risk pregnancy, unspecified, third trimester: Secondary | ICD-10-CM

## 2019-12-05 DIAGNOSIS — Z3A29 29 weeks gestation of pregnancy: Secondary | ICD-10-CM

## 2019-12-05 DIAGNOSIS — Z3A26 26 weeks gestation of pregnancy: Secondary | ICD-10-CM

## 2019-12-05 DIAGNOSIS — E0590 Thyrotoxicosis, unspecified without thyrotoxic crisis or storm: Secondary | ICD-10-CM

## 2019-12-05 NOTE — Progress Notes (Signed)
Routine Prenatal Care Visit  Subjective  Brenda Villegas is a 29 y.o. G2P1001 at [redacted]w[redacted]d being seen today for ongoing prenatal care.  She is currently monitored for the following issues for this high-risk pregnancy and has Hyperthyroidism; Depression; Supervision of high risk pregnancy, antepartum; H/O Graves' disease; and Rh negative state in antepartum period on their problem list.  ----------------------------------------------------------------------------------- Patient reports mildly itchy rash on bilateral abdomen is improving with bio oil.   Contractions: Not present. Vag. Bleeding: None.  Movement: Present. Leaking Fluid denies.  ----------------------------------------------------------------------------------- The following portions of the patient's history were reviewed and updated as appropriate: allergies, current medications, past family history, past medical history, past social history, past surgical history and problem list. Problem list updated.  Objective  Blood pressure 126/74, weight 229 lb (103.9 kg), last menstrual period 04/23/2019. Pregravid weight 197 lb (89.4 kg) Total Weight Gain 32 lb (14.5 kg) Urinalysis: Urine Protein    Urine Glucose    Fetal Status: Fetal Heart Rate (bpm): 134 Fundal Height: 29 cm Movement: Present     General:  Alert, oriented and cooperative. Patient is in no acute distress.  Skin: Skin is warm and dry. No rash noted.   Cardiovascular: Normal heart rate noted  Respiratory: Normal respiratory effort, no problems with respiration noted  Abdomen: Soft, gravid, appropriate for gestational age. Pain/Pressure: Absent     Pelvic:  Cervical exam deferred        Extremities: Normal range of motion.  Edema: None  Mental Status: Normal mood and affect. Normal behavior. Normal judgment and thought content.   Assessment   29 y.o. G2P1001 at [redacted]w[redacted]d by  02/20/2020, by Ultrasound presenting for routine prenatal visit  Plan   Pregnancy#2  Problems (from 04/23/19 to present)    Problem Noted Resolved   Supervision of high risk pregnancy, antepartum 06/28/2019 by Vena Austria, MD No   Overview Addendum 07/03/2019  5:01 PM by Conard Novak, MD    [facility] Prenatal Labs  Dating 6 week ultrasound Blood type: O/Negative/-- (05/20 1121)   Genetic Screen Declines AFP: [ ]  Antibody:Negative (05/20 1121)  Anatomic 08-[birth year]-1983  Rubella: 3.58 (05/20 1121)  Varicella: Immune  GTT Early:               Third trimester:  RPR: Non Reactive (05/20 1121)   Rhogam [ ]  28 weeks HBsAg: Negative (05/20 1121)   TDaP vaccine                       Flu Shot: Covid: has not had HIV: Non Reactive (05/20 1121)   Baby Food                                GBS:   Contraception  Pap: 10/06/2017 NILM  CBB     CS/VBAC    Support Person            Previous Version   H/O Graves' disease 06/28/2019 by 10/08/2017, MD No   Overview Signed 07/03/2019  5:01 PM by Vena Austria, MD    - on no meds - normal TFTs at [facility]. TSI negative at [facility]      Rh negative state in antepartum period 06/28/2019 by Conard Novak, MD No    28 week labs and TSH/T4 labs today  Return for worsening symptoms of rash/or rash on soles and palms  Preterm labor symptoms and general obstetric precautions  including but not limited to vaginal bleeding, contractions, leaking of fluid and fetal movement were reviewed in detail with the patient.    Return in about 2 weeks (around 12/19/2019) for rob.  Tresea Mall, CNM 12/05/2019 2:37 PM

## 2019-12-05 NOTE — Progress Notes (Signed)
28 week labs today. No vb. No lof.  

## 2019-12-06 LAB — 28 WEEKS RH-PANEL
Antibody Screen: NEGATIVE
Basophils Absolute: 0.1 10*3/uL (ref 0.0–0.2)
Basos: 1 %
EOS (ABSOLUTE): 0.5 10*3/uL — ABNORMAL HIGH (ref 0.0–0.4)
Eos: 4 %
Gestational Diabetes Screen: 103 mg/dL (ref 65–139)
HIV Screen 4th Generation wRfx: NONREACTIVE
Hematocrit: 38.2 % (ref 34.0–46.6)
Hemoglobin: 12.5 g/dL (ref 11.1–15.9)
Immature Grans (Abs): 0.1 10*3/uL (ref 0.0–0.1)
Immature Granulocytes: 1 %
Lymphocytes Absolute: 1.8 10*3/uL (ref 0.7–3.1)
Lymphs: 17 %
MCH: 28.7 pg (ref 26.6–33.0)
MCHC: 32.7 g/dL (ref 31.5–35.7)
MCV: 88 fL (ref 79–97)
Monocytes Absolute: 0.4 10*3/uL (ref 0.1–0.9)
Monocytes: 4 %
Neutrophils Absolute: 7.6 10*3/uL — ABNORMAL HIGH (ref 1.4–7.0)
Neutrophils: 73 %
Platelets: 154 10*3/uL (ref 150–450)
RBC: 4.36 x10E6/uL (ref 3.77–5.28)
RDW: 11.9 % (ref 11.7–15.4)
RPR Ser Ql: NONREACTIVE
WBC: 10.4 10*3/uL (ref 3.4–10.8)

## 2019-12-21 ENCOUNTER — Other Ambulatory Visit: Payer: Self-pay

## 2019-12-21 ENCOUNTER — Ambulatory Visit (INDEPENDENT_AMBULATORY_CARE_PROVIDER_SITE_OTHER): Payer: No Typology Code available for payment source | Admitting: Obstetrics & Gynecology

## 2019-12-21 ENCOUNTER — Other Ambulatory Visit: Payer: Self-pay | Admitting: Advanced Practice Midwife

## 2019-12-21 ENCOUNTER — Encounter: Payer: Self-pay | Admitting: Obstetrics & Gynecology

## 2019-12-21 VITALS — BP 120/70 | Wt 233.0 lb

## 2019-12-21 DIAGNOSIS — E0590 Thyrotoxicosis, unspecified without thyrotoxic crisis or storm: Secondary | ICD-10-CM

## 2019-12-21 DIAGNOSIS — O0993 Supervision of high risk pregnancy, unspecified, third trimester: Secondary | ICD-10-CM

## 2019-12-21 DIAGNOSIS — Z3A31 31 weeks gestation of pregnancy: Secondary | ICD-10-CM

## 2019-12-21 DIAGNOSIS — Z23 Encounter for immunization: Secondary | ICD-10-CM | POA: Diagnosis not present

## 2019-12-21 LAB — POCT URINALYSIS DIPSTICK OB
Glucose, UA: NEGATIVE
POC,PROTEIN,UA: NEGATIVE

## 2019-12-21 NOTE — Progress Notes (Signed)
  Subjective  Fetal Movement? yes Contractions? no Leaking Fluid? no Vaginal Bleeding? no  Objective  BP 120/70   Wt 233 lb (105.7 kg)   LMP 04/23/2019 (LMP Unknown)   BMI 35.43 kg/m  General: NAD Pumonary: no increased work of breathing Abdomen: gravid, non-tender Extremities: no edema Psychiatric: mood appropriate, affect full  Assessment  29 y.o. G2P1001 at [redacted]w[redacted]d by  02/20/2020, by Ultrasound presenting for routine prenatal visit  Plan   Problem List Items Addressed This Visit      Endocrine   Hyperthyroidism    Other Visit Diagnoses    Need for immunization against influenza    -  Primary   Relevant Orders   Flu Vaccine QUAD 36+ mos IM (Completed)   [redacted] weeks gestation of pregnancy       Relevant Orders   POC Urinalysis Dipstick OB (Completed)   Supervision of high risk pregnancy in third trimester        TSH today PNV, [facility]  Pregnancy#2 Problems (from 04/23/19 to present)    Problem Noted Resolved   Supervision of high risk pregnancy, antepartum 06/28/2019 by Vena Austria, MD No   Overview Addendum 07/03/2019  5:01 PM by Conard Novak, MD    [facility] Prenatal Labs  Dating 6 week ultrasound Blood type: O/Negative/-- (05/20 1121)   Genetic Screen Declines AFP: [ ]  Antibody:Negative (05/20 1121)  Anatomic 08-[birth year]-1983  Rubella: 3.58 (05/20 1121)  Varicella: Immune  GTT Early:               Third trimester:  RPR: Non Reactive (05/20 1121)   Rhogam [ ]  28 weeks HBsAg: Negative (05/20 1121)   TDaP vaccine                       Flu Shot: Covid: has not had HIV: Non Reactive (05/20 1121)   Baby Food                                GBS:   Contraception  Pap: 10/06/2017 NILM  CBB     CS/VBAC    Support Person            Previous Version   H/O Graves' disease 06/28/2019 by 10/08/2017, MD No   Overview Signed 07/03/2019  5:01 PM by Vena Austria, MD    - on no meds - normal TFTs at [facility]. TSI negative at NO[facility]      Rh negative state in antepartum  period 06/28/2019 by Conard Novak, MD No       06/30/2019, MD, Vena Austria Ob/Gyn, [facility] 12/21/2019  9:57 AM

## 2019-12-21 NOTE — Patient Instructions (Signed)
Third Trimester of Pregnancy The third trimester is from week 28 through week 40 (months 7 through 9). The third trimester is a time when the unborn baby (fetus) is growing rapidly. At the end of the ninth month, the fetus is about 20 inches in length and weighs 6-10 pounds. Body changes during your third trimester Your body will continue to go through many changes during pregnancy. The changes vary from woman to woman. During the third trimester:  Your weight will continue to increase. You can expect to gain 25-35 pounds (11-16 kg) by the end of the pregnancy.  You may begin to get stretch marks on your hips, abdomen, and breasts.  You may urinate more often because the fetus is moving lower into your pelvis and pressing on your bladder.  You may develop or continue to have heartburn. This is caused by increased hormones that slow down muscles in the digestive tract.  You may develop or continue to have constipation because increased hormones slow digestion and cause the muscles that push waste through your intestines to relax.  You may develop hemorrhoids. These are swollen veins (varicose veins) in the rectum that can itch or be painful.  You may develop swollen, bulging veins (varicose veins) in your legs.  You may have increased body aches in the pelvis, back, or thighs. This is due to weight gain and increased hormones that are relaxing your joints.  You may have changes in your hair. These can include thickening of your hair, rapid growth, and changes in texture. Some women also have hair loss during or after pregnancy, or hair that feels dry or thin. Your hair will most likely return to normal after your baby is born.  Your breasts will continue to grow and they will continue to become tender. A yellow fluid (colostrum) may leak from your breasts. This is the first milk you are producing for your baby.  Your belly button may stick out.  You may notice more swelling in your hands,  face, or ankles.  You may have increased tingling or numbness in your hands, arms, and legs. The skin on your belly may also feel numb.  You may feel short of breath because of your expanding uterus.  You may have more problems sleeping. This can be caused by the size of your belly, increased need to urinate, and an increase in your body's metabolism.  You may notice the fetus "dropping," or moving lower in your abdomen (lightening).  You may have increased vaginal discharge.  You may notice your joints feel loose and you may have pain around your pelvic bone. What to expect at prenatal visits You will have prenatal exams every 2 weeks until week 36. Then you will have weekly prenatal exams. During a routine prenatal visit:  You will be weighed to make sure you and the baby are growing normally.  Your blood pressure will be taken.  Your abdomen will be measured to track your baby's growth.  The fetal heartbeat will be listened to.  Any test results from the previous visit will be discussed.  You may have a cervical check near your due date to see if your cervix has softened or thinned (effaced).  You will be tested for Group B streptococcus. This happens between 35 and 37 weeks. Your health care provider may ask you:  What your birth plan is.  How you are feeling.  If you are feeling the baby move.  If you have had any abnormal   symptoms, such as leaking fluid, bleeding, severe headaches, or abdominal cramping.  If you are using any tobacco products, including cigarettes, chewing tobacco, and electronic cigarettes.  If you have any questions. Other tests or screenings that may be performed during your third trimester include:  Blood tests that check for low iron levels (anemia).  Fetal testing to check the health, activity level, and growth of the fetus. Testing is done if you have certain medical conditions or if there are problems during the pregnancy.  Nonstress test  (NST). This test checks the health of your baby to make sure there are no signs of problems, such as the baby not getting enough oxygen. During this test, a belt is placed around your belly. The baby is made to move, and its heart rate is monitored during movement. What is false labor? False labor is a condition in which you feel small, irregular tightenings of the muscles in the womb (contractions) that usually go away with rest, changing position, or drinking water. These are called Braxton Hicks contractions. Contractions may last for hours, days, or even weeks before true labor sets in. If contractions come at regular intervals, become more frequent, increase in intensity, or become painful, you should see your health care provider. What are the signs of labor?  Abdominal cramps.  Regular contractions that start at 10 minutes apart and become stronger and more frequent with time.  Contractions that start on the top of the uterus and spread down to the lower abdomen and back.  Increased pelvic pressure and dull back pain.  A watery or bloody mucus discharge that comes from the vagina.  Leaking of amniotic fluid. This is also known as your "water breaking." It could be a slow trickle or a gush. Let your health care provider know if it has a color or strange odor. If you have any of these signs, call your health care provider right away, even if it is before your due date. Follow these instructions at home: Medicines  Follow your health care provider's instructions regarding medicine use. Specific medicines may be either safe or unsafe to take during pregnancy.  Take a prenatal vitamin that contains at least 600 micrograms (mcg) of folic acid.  If you develop constipation, try taking a stool softener if your health care provider approves. Eating and drinking   Eat a balanced diet that includes fresh fruits and vegetables, whole grains, good sources of protein such as meat, eggs, or tofu,  and low-fat dairy. Your health care provider will help you determine the amount of weight gain that is right for you.  Avoid raw meat and uncooked cheese. These carry germs that can cause birth defects in the baby.  If you have low calcium intake from food, talk to your health care provider about whether you should take a daily calcium supplement.  Eat four or five small meals rather than three large meals a day.  Limit foods that are high in fat and processed sugars, such as fried and sweet foods.  To prevent constipation: ? Drink enough fluid to keep your urine clear or pale yellow. ? Eat foods that are high in fiber, such as fresh fruits and vegetables, whole grains, and beans. Activity  Exercise only as directed by your health care provider. Most women can continue their usual exercise routine during pregnancy. Try to exercise for 30 minutes at least 5 days a week. Stop exercising if you experience uterine contractions.  Avoid heavy lifting.  Do   not exercise in extreme heat or humidity, or at high altitudes.  Wear low-heel, comfortable shoes.  Practice good posture.  You may continue to have sex unless your health care provider tells you otherwise. Relieving pain and discomfort  Take frequent breaks and rest with your legs elevated if you have leg cramps or low back pain.  Take warm sitz baths to soothe any pain or discomfort caused by hemorrhoids. Use hemorrhoid cream if your health care provider approves.  Wear a good support bra to prevent discomfort from breast tenderness.  If you develop varicose veins: ? Wear support pantyhose or compression stockings as told by your healthcare provider. ? Elevate your feet for 15 minutes, 3-4 times a day. Prenatal care  Write down your questions. Take them to your prenatal visits.  Keep all your prenatal visits as told by your health care provider. This is important. Safety  Wear your seat belt at all times when driving.  Make  a list of emergency phone numbers, including numbers for family, friends, the hospital, and police and fire departments. General instructions  Avoid cat litter boxes and soil used by cats. These carry germs that can cause birth defects in the baby. If you have a cat, ask someone to clean the litter box for you.  Do not travel far distances unless it is absolutely necessary and only with the approval of your health care provider.  Do not use hot tubs, steam rooms, or saunas.  Do not drink alcohol.  Do not use any products that contain nicotine or tobacco, such as cigarettes and e-cigarettes. If you need help quitting, ask your health care provider.  Do not use any medicinal herbs or unprescribed drugs. These chemicals affect the formation and growth of the baby.  Do not douche or use tampons or scented sanitary pads.  Do not cross your legs for long periods of time.  To prepare for the arrival of your baby: ? Take prenatal classes to understand, practice, and ask questions about labor and delivery. ? Make a trial run to the hospital. ? Visit the hospital and tour the maternity area. ? Arrange for maternity or paternity leave through employers. ? Arrange for family and friends to take care of pets while you are in the hospital. ? Purchase a rear-facing car seat and make sure you know how to install it in your car. ? Pack your hospital bag. ? Prepare the baby's nursery. Make sure to remove all pillows and stuffed animals from the baby's crib to prevent suffocation.  Visit your dentist if you have not gone during your pregnancy. Use a soft toothbrush to brush your teeth and be gentle when you floss. Contact a health care provider if:  You are unsure if you are in labor or if your water has broken.  You become dizzy.  You have mild pelvic cramps, pelvic pressure, or nagging pain in your abdominal area.  You have lower back pain.  You have persistent nausea, vomiting, or  diarrhea.  You have an unusual or bad smelling vaginal discharge.  You have pain when you urinate. Get help right away if:  Your water breaks before 37 weeks.  You have regular contractions less than 5 minutes apart before 37 weeks.  You have a fever.  You are leaking fluid from your vagina.  You have spotting or bleeding from your vagina.  You have severe abdominal pain or cramping.  You have rapid weight loss or weight gain.  You have   shortness of breath with chest pain.  You notice sudden or extreme swelling of your face, hands, ankles, feet, or legs.  Your baby makes fewer than 10 movements in 2 hours.  You have severe headaches that do not go away when you take medicine.  You have vision changes. Summary  The third trimester is from week 28 through week 40, months 7 through 9. The third trimester is a time when the unborn baby (fetus) is growing rapidly.  During the third trimester, your discomfort may increase as you and your baby continue to gain weight. You may have abdominal, leg, and back pain, sleeping problems, and an increased need to urinate.  During the third trimester your breasts will keep growing and they will continue to become tender. A yellow fluid (colostrum) may leak from your breasts. This is the first milk you are producing for your baby.  False labor is a condition in which you feel small, irregular tightenings of the muscles in the womb (contractions) that eventually go away. These are called Braxton Hicks contractions. Contractions may last for hours, days, or even weeks before true labor sets in.  Signs of labor can include: abdominal cramps; regular contractions that start at 10 minutes apart and become stronger and more frequent with time; watery or bloody mucus discharge that comes from the vagina; increased pelvic pressure and dull back pain; and leaking of amniotic fluid. This information is not intended to replace advice given to you by your  health care provider. Make sure you discuss any questions you have with your health care provider. Document Revised: 05/18/2018 Document Reviewed: 03/02/2016 Elsevier Patient Education  2020 [employer]  

## 2019-12-22 LAB — TSH+FREE T4
Free T4: 1 ng/dL (ref 0.82–1.77)
TSH: 0.974 u[IU]/mL (ref 0.450–4.500)

## 2019-12-31 ENCOUNTER — Other Ambulatory Visit: Payer: Self-pay

## 2019-12-31 ENCOUNTER — Emergency Department
Admission: EM | Admit: 2019-12-31 | Discharge: 2019-12-31 | Disposition: A | Discharge status: Home / Self Care | Payer: No Typology Code available for payment source | Attending: Emergency Medicine | Admitting: Emergency Medicine

## 2019-12-31 DIAGNOSIS — R112 Nausea with vomiting, unspecified: Secondary | ICD-10-CM | POA: Insufficient documentation

## 2019-12-31 DIAGNOSIS — Z5321 Procedure and treatment not carried out due to patient leaving prior to being seen by health care provider: Secondary | ICD-10-CM | POA: Insufficient documentation

## 2019-12-31 DIAGNOSIS — R040 Epistaxis: Secondary | ICD-10-CM | POA: Insufficient documentation

## 2019-12-31 DIAGNOSIS — R0981 Nasal congestion: Secondary | ICD-10-CM | POA: Diagnosis not present

## 2019-12-31 NOTE — ED Notes (Signed)
No active bleeding at this time. 

## 2019-12-31 NOTE — ED Triage Notes (Signed)
Patient reports nose bleed for the past 30 minutes.  States having nasal congestion and blowing nose frequently.  Reports she was nauseated and vomited and when she did nose started bleeding.  It stopped for a short period of time then started again.  Was concerned because she is 32 weeks pregant.  Reports nose bleeds as a child and had to have it cauterized in the past.

## 2020-01-01 ENCOUNTER — Ambulatory Visit (INDEPENDENT_AMBULATORY_CARE_PROVIDER_SITE_OTHER): Payer: No Typology Code available for payment source | Admitting: Obstetrics

## 2020-01-01 VITALS — BP 110/60 | Wt 231.0 lb

## 2020-01-01 DIAGNOSIS — Z6791 Unspecified blood type, Rh negative: Secondary | ICD-10-CM | POA: Diagnosis not present

## 2020-01-01 DIAGNOSIS — O0993 Supervision of high risk pregnancy, unspecified, third trimester: Secondary | ICD-10-CM

## 2020-01-01 DIAGNOSIS — O26893 Other specified pregnancy related conditions, third trimester: Secondary | ICD-10-CM

## 2020-01-01 DIAGNOSIS — Z3A32 32 weeks gestation of pregnancy: Secondary | ICD-10-CM

## 2020-01-01 DIAGNOSIS — O0990 Supervision of high risk pregnancy, unspecified, unspecified trimester: Secondary | ICD-10-CM

## 2020-01-01 LAB — POCT URINALYSIS DIPSTICK OB: Glucose, UA: NEGATIVE

## 2020-01-01 MED ORDER — RHO D IMMUNE GLOBULIN 1500 UNIT/2ML IJ SOSY
300.0000 ug | PREFILLED_SYRINGE | Freq: Once | INTRAMUSCULAR | Status: AC
  Administered 2020-01-01: 300 ug via INTRAMUSCULAR

## 2020-01-01 NOTE — Progress Notes (Signed)
No concerns, rj 

## 2020-01-01 NOTE — Progress Notes (Signed)
Routine Prenatal Care Visit  Subjective  Brenda Villegas is a 29 y.o. G2P1001 at [redacted]w[redacted]d being seen today for ongoing prenatal care.  She is currently monitored for the following issues for this high-risk pregnancy and has Hyperthyroidism; Depression; Supervision of high risk pregnancy, antepartum; H/O Graves' disease; and Rh negative state in antepartum period on their problem list.  ----------------------------------------------------------------------------------- Patient reports no bleeding, no cramping, no leaking and she is continuing to have the itching and PUPPS along both her sides. she is now waiting tables too, and has a lot of BH UCs.  She has yet to receive Rhogam this pregnancy.  .  .   Pincus Large Fluid denies.  ----------------------------------------------------------------------------------- The following portions of the patient's history were reviewed and updated as appropriate: allergies, current medications, past family history, past medical history, past social history, past surgical history and problem list. Problem list updated.  Objective  Blood pressure 110/60, weight 231 lb (104.8 kg), last menstrual period 04/23/2019. Pregravid weight 197 lb (89.4 kg) Total Weight Gain 34 lb (15.4 kg) Urinalysis: Urine Protein Trace  Urine Glucose Negative  Fetal Status:           General:  Alert, oriented and cooperative. Patient is in no acute distress.  Skin: Skin is warm and dry. No rash noted.   Cardiovascular: Normal heart rate noted  Respiratory: Normal respiratory effort, no problems with respiration noted  Abdomen: Soft, gravid, appropriate for gestational age.       Pelvic:  Cervical exam deferred        Extremities: Normal range of motion.  Edema: None  Mental Status: Normal mood and affect. Normal behavior. Normal judgment and thought content.   Assessment   29 y.o. G2P1001 at [redacted]w[redacted]d by  02/20/2020, by Ultrasound presenting for routine prenatal visit  Plan    Pregnancy#2 Problems (from 04/23/19 to present)    Problem Noted Resolved   Supervision of high risk pregnancy, antepartum 06/28/2019 by Vena Austria, MD No   Overview Addendum 07/03/2019  5:01 PM by Conard Novak, MD    [facility]inic [facility] Prenatal Labs  Dating 6 week ultrasound Blood type: O/Negative/-- (05/20 1121)   Genetic Screen Declines AFP: [ ]  Antibody:Negative (05/20 1121)  Anatomic 08-[birth year]-1983  Rubella: 3.58 (05/20 1121)  Varicella: Immune  GTT Early:               Third trimester:  RPR: Non Reactive (05/20 1121)   Rhogam [ ]  28 weeks HBsAg: Negative (05/20 1121)   TDaP vaccine                       Flu Shot: Covid: has not had HIV: Non Reactive (05/20 1121)   Baby Food                                GBS:   Contraception  Pap: 10/06/2017 NILM  CBB     CS/VBAC    Support Person            Previous Version   H/O Graves' disease 06/28/2019 by 10/08/2017, MD No   Overview Signed 07/03/2019  5:01 PM by Vena Austria, MD    - on no meds - normal TFTs at NOB. TSI negative at NOB      Rh negative state in antepartum period 06/28/2019 by Conard Novak, MD No       Preterm labor symptoms and  general obstetric precautions including but not limited to vaginal bleeding, contractions, leaking of fluid and fetal movement were reviewed in detail with the patient. Please refer to After Visit Summary for other counseling recommendations.  Rhogam given today.  No follow-ups on file.  Mirna Mires, CNM  01/01/2020 12:15 PM

## 2020-01-09 ENCOUNTER — Encounter: Payer: No Typology Code available for payment source | Admitting: Obstetrics and Gynecology

## 2020-01-09 ENCOUNTER — Ambulatory Visit (INDEPENDENT_AMBULATORY_CARE_PROVIDER_SITE_OTHER): Payer: No Typology Code available for payment source | Admitting: Obstetrics

## 2020-01-09 ENCOUNTER — Other Ambulatory Visit: Payer: Self-pay

## 2020-01-09 VITALS — BP 118/74 | Wt 233.0 lb

## 2020-01-09 DIAGNOSIS — O0990 Supervision of high risk pregnancy, unspecified, unspecified trimester: Secondary | ICD-10-CM

## 2020-01-09 DIAGNOSIS — Z3A34 34 weeks gestation of pregnancy: Secondary | ICD-10-CM

## 2020-01-09 DIAGNOSIS — O0993 Supervision of high risk pregnancy, unspecified, third trimester: Secondary | ICD-10-CM

## 2020-01-09 NOTE — Progress Notes (Signed)
Routine Prenatal Care Visit  Subjective  Brenda Villegas is a 29 y.o. G2P1001 at [redacted]w[redacted]d being seen today for ongoing prenatal care.  She is currently monitored for the following issues for this high-risk pregnancy and has Hyperthyroidism; Depression; Supervision of high risk pregnancy, antepartum; H/O Graves' disease; and Rh negative state in antepartum period on their problem list.  ----------------------------------------------------------------------------------- Patient reports no complaints.   Contractions: Not present. Vag. Bleeding: None.  Movement: Present. Leaking Fluid denies.  ----------------------------------------------------------------------------------- The following portions of the patient's history were reviewed and updated as appropriate: allergies, current medications, past family history, past medical history, past social history, past surgical history and problem list. Problem list updated.  Objective  Blood pressure 118/74, weight 233 lb (105.7 kg), last menstrual period 04/23/2019. Pregravid weight 197 lb (89.4 kg) Total Weight Gain 36 lb (16.3 kg) Urinalysis: Urine Protein    Urine Glucose    Fetal Status:     Movement: Present     General:  Alert, oriented and cooperative. Patient is in no acute distress.  Skin: Skin is warm and dry. No rash noted.   Cardiovascular: Normal heart rate noted  Respiratory: Normal respiratory effort, no problems with respiration noted  Abdomen: Soft, gravid, appropriate for gestational age. Pain/Pressure: Present     Pelvic:  Cervical exam deferred        Extremities: Normal range of motion.     Mental Status: Normal mood and affect. Normal behavior. Normal judgment and thought content.   Assessment   28 y.o. G2P1001 at [redacted]w[redacted]d by  02/20/2020, by Ultrasound presenting for routine prenatal visit  Plan   Pregnancy#2 Problems (from 04/23/19 to present)    Problem Noted Resolved   Supervision of high risk pregnancy, antepartum  06/28/2019 by Vena Austria, MD No   Overview Addendum 01/09/2020  9:58 AM by Mirna Mires, CNM    [facility]inic [facility] Prenatal [facility]  Dating 6 week ultrasound Blood type: O/Negative/-- (05/20 1121)   Genetic Screen Declines AFP: [ ]  Antibody:Negative (05/20 1121)  Anatomic 08-05-1983 Normal female Rubella: 3.58 (05/20 1121)  Varicella: Immune  GTT Early:               Third 103 trimester:  RPR: Non Reactive (05/20 1121)   Rhogam [x ] 28 weeks. Given 11/23 HBsAg: Negative (05/20 1121)   TDaP vaccine                       Flu Shot: Covid: has not had HIV: Non Reactive (05/20 1121)   Baby Food                               Breast GBS:   Contraception  IUD? Pap: 10/06/2017 NILM  CBB     CS/VBAC    Support Person husband           Previous Version   H/O Graves' disease 06/28/2019 by 06/30/2019, MD No   Overview Signed 07/03/2019  5:01 PM by 07/05/2019, MD    - on no meds - normal TFTs at [facility]B. TSI negative at NOB      Rh negative state in antepartum period 06/28/2019 by 06/30/2019, MD No       Preterm labor symptoms and general obstetric precautions including but not limited to vaginal bleeding, contractions, leaking of fluid and fetal movement were reviewed in detail with the patient. Please refer to After Visit  Summary for other counseling recommendations.   Return in about 2 weeks (around 01/23/2020) for return OB, GBS.  Mirna Mires, CNM  01/09/2020 9:59 AM

## 2020-01-09 NOTE — Progress Notes (Signed)
No vb. No lof.  

## 2020-01-16 ENCOUNTER — Ambulatory Visit (INDEPENDENT_AMBULATORY_CARE_PROVIDER_SITE_OTHER): Payer: No Typology Code available for payment source | Admitting: Obstetrics and Gynecology

## 2020-01-16 ENCOUNTER — Encounter: Payer: Self-pay | Admitting: Obstetrics and Gynecology

## 2020-01-16 ENCOUNTER — Other Ambulatory Visit: Payer: Self-pay

## 2020-01-16 VITALS — BP 118/70 | Wt 235.0 lb

## 2020-01-16 DIAGNOSIS — E0590 Thyrotoxicosis, unspecified without thyrotoxic crisis or storm: Secondary | ICD-10-CM

## 2020-01-16 DIAGNOSIS — Z6791 Unspecified blood type, Rh negative: Secondary | ICD-10-CM

## 2020-01-16 DIAGNOSIS — O26899 Other specified pregnancy related conditions, unspecified trimester: Secondary | ICD-10-CM

## 2020-01-16 DIAGNOSIS — O0993 Supervision of high risk pregnancy, unspecified, third trimester: Secondary | ICD-10-CM

## 2020-01-16 DIAGNOSIS — Z3A35 35 weeks gestation of pregnancy: Secondary | ICD-10-CM

## 2020-01-16 LAB — POCT URINALYSIS DIPSTICK OB
Glucose, UA: NEGATIVE
POC,PROTEIN,UA: NEGATIVE

## 2020-01-16 NOTE — Progress Notes (Signed)
Routine Prenatal Care Visit  Subjective  Brenda Villegas is a 29 y.o. G2P1001 at [redacted]w[redacted]d being seen today for ongoing prenatal care.  She is currently monitored for the following issues for this high-risk pregnancy and has Hyperthyroidism; Depression; Supervision of high risk pregnancy, antepartum; H/O Graves' disease; and Rh negative state in antepartum period on their problem list.  ----------------------------------------------------------------------------------- Patient reports no complaints.   Contractions: Not present. Vag. Bleeding: None.  Movement: Present. Leaking Fluid denies.  ----------------------------------------------------------------------------------- The following portions of the patient's history were reviewed and updated as appropriate: allergies, current medications, past family history, past medical history, past social history, past surgical history and problem list. Problem list updated.  Objective  Blood pressure 118/70, weight 235 lb (106.6 kg), last menstrual period 04/23/2019. Pregravid weight 197 lb (89.4 kg) Total Weight Gain 38 lb (17.2 kg) Urinalysis: Urine Protein    Urine Glucose    Fetal Status: Fetal Heart Rate (bpm): 140 Fundal Height: 35 cm Movement: Present     General:  Alert, oriented and cooperative. Patient is in no acute distress.  Skin: Skin is warm and dry. No rash noted.   Cardiovascular: Normal heart rate noted  Respiratory: Normal respiratory effort, no problems with respiration noted  Abdomen: Soft, gravid, appropriate for gestational age. Pain/Pressure: Present     Pelvic:  Cervical exam deferred        Extremities: Normal range of motion.  Edema: None  Mental Status: Normal mood and affect. Normal behavior. Normal judgment and thought content.   Assessment   29 y.o. G2P1001 at [redacted]w[redacted]d by  02/20/2020, by Ultrasound presenting for routine prenatal visit  Plan   Pregnancy#2 Problems (from 04/23/19 to present)    Problem Noted  Resolved   Supervision of high risk pregnancy, antepartum 06/28/2019 by Vena Austria, MD No   Overview Addendum 01/09/2020  9:58 AM by Mirna Mires, CNM    [facility]inic [facility] Prenatal [facility]  Dating 6 week ultrasound Blood type: O/Negative/-- (05/20 1121)   Genetic Screen Declines AFP: [ ]  Antibody:Negative (05/20 1121)  Anatomic 08-05-1983 Normal female Rubella: 3.58 (05/20 1121)  Varicella: Immune  GTT Early:               Third 103 trimester:  RPR: Non Reactive (05/20 1121)   Rhogam [x ] 28 weeks. Given 11/23 HBsAg: Negative (05/20 1121)   TDaP vaccine                       Flu Shot: Covid: has not had HIV: Non Reactive (05/20 1121)   Baby Food                               Breast GBS:   Contraception  IUD? Pap: 10/06/2017 NILM  CBB     CS/VBAC    Support Person husband           Previous Version   H/O Graves' disease 06/28/2019 by 06/30/2019, MD No   Overview Signed 07/03/2019  5:01 PM by 07/05/2019, MD    - on no meds - normal TFTs at NOB. TSI negative at NOB      Rh negative state in antepartum period 06/28/2019 by 06/30/2019, MD No   Hyperthyroidism 08/07/2014 by 08/09/2014, PA-C No       Preterm labor symptoms and general obstetric precautions including but not limited to vaginal bleeding, contractions, leaking of fluid  and fetal movement were reviewed in detail with the patient. Please refer to After Visit Summary for other counseling recommendations.   Return in about 1 week (around 01/23/2020) for Routine Prenatal Appointment.   Thomasene Mohair, MD, [facility], [facility] 01/16/2020 10:07 AM

## 2020-01-16 NOTE — Addendum Note (Signed)
Addended by: Cornelius Moras D on: 01/16/2020 10:26 AM   Modules accepted: Orders

## 2020-01-23 ENCOUNTER — Other Ambulatory Visit (HOSPITAL_COMMUNITY)
Admission: RE | Admit: 2020-01-23 | Discharge: 2020-01-23 | Disposition: A | Discharge status: Home / Self Care | Payer: Medicaid Other | Source: Ambulatory Visit | Attending: Obstetrics and Gynecology | Admitting: Obstetrics and Gynecology

## 2020-01-23 ENCOUNTER — Ambulatory Visit (INDEPENDENT_AMBULATORY_CARE_PROVIDER_SITE_OTHER): Payer: No Typology Code available for payment source | Admitting: Obstetrics and Gynecology

## 2020-01-23 ENCOUNTER — Other Ambulatory Visit: Payer: Self-pay

## 2020-01-23 ENCOUNTER — Encounter: Payer: Self-pay | Admitting: Obstetrics and Gynecology

## 2020-01-23 VITALS — BP 112/68 | Wt 237.0 lb

## 2020-01-23 DIAGNOSIS — O0993 Supervision of high risk pregnancy, unspecified, third trimester: Secondary | ICD-10-CM

## 2020-01-23 DIAGNOSIS — O0990 Supervision of high risk pregnancy, unspecified, unspecified trimester: Secondary | ICD-10-CM

## 2020-01-23 DIAGNOSIS — Z3A36 36 weeks gestation of pregnancy: Secondary | ICD-10-CM

## 2020-01-23 NOTE — Progress Notes (Signed)
Routine Prenatal Care Visit  Subjective  Brenda Villegas is a 29 y.o. G2P1001 at [redacted]w[redacted]d being seen today for ongoing prenatal care.  She is currently monitored for the following issues for this high-risk pregnancy and has Hyperthyroidism; Depression; Supervision of high risk pregnancy, antepartum; H/O Graves' disease; and Rh negative state in antepartum period on their problem list.  ----------------------------------------------------------------------------------- Patient reports increased pelvic pressure/pain that radiates towards upper thighs, is associated with walking three flights of stays to home..   Contractions: Not present. Vag. Bleeding: None.  Movement: Present. Denies leaking of fluid.  ----------------------------------------------------------------------------------- The following portions of the patient's history were reviewed and updated as appropriate: allergies, current medications, past family history, past medical history, past social history, past surgical history and problem list. Problem list updated.   Objective  Blood pressure 112/68, weight 237 lb (107.5 kg), last menstrual period 04/23/2019. Pregravid weight 197 lb (89.4 kg) Total Weight Gain 40 lb (18.1 kg) Urinalysis:      Fetal Status: Fetal Heart Rate (bpm): 140 Fundal Height: 36 cm Movement: Present  Presentation: Vertex  General:  Alert, oriented and cooperative. Patient is in no acute distress.  Skin: Skin is warm and dry. No rash noted.   Cardiovascular: Normal heart rate noted  Respiratory: Normal respiratory effort, no problems with respiration noted  Abdomen: Soft, gravid, appropriate for gestational age. Pain/Pressure: Present     Pelvic:  cervical exam performed per patient request Dilation: Closed Effacement (%): Thick Station: -2  Extremities: Normal range of motion.  Edema: None  ental Status: Normal mood and affect. Normal behavior. Normal judgment and thought content.      Assessment   29 y.o. G2P1001 at [redacted]w[redacted]d by  02/20/2020, by Ultrasound presenting for routine prenatal visit  Plan   Pregnancy#2 Problems (from 04/23/19 to present)    Problem Noted Resolved   Supervision of high risk pregnancy, antepartum 06/28/2019 by Vena Austria, MD No   Overview Addendum 01/23/2020 10:12 AM by Zipporah Plants, CNM    [facility]inic [facility] Prenatal [facility]  Dating 6 week ultrasound Blood type: O/Negative/-- (05/20 1121)   Genetic Screen Declines AFP: [ ]  Antibody:Negative (05/20 1121)  Anatomic 08-05-1983 Normal female Rubella: 3.58 (05/20 1121)  Varicella: Immune  GTT Early:               Third 103 trimester:  RPR: Non Reactive (05/20 1121)   Rhogam [x ] 28 weeks. Given 11/23 HBsAg: Negative (05/20 1121)   TDaP vaccine                       Flu Shot: Covid: has not had HIV: Non Reactive (05/20 1121)   Baby Food                               Breast GBS:  collected 01/23/20  Contraception  IUD? Pap: 10/06/2017 NILM  CBB     CS/VBAC    Support Person husband           Previous Version   H/O Graves' disease 06/28/2019 by 06/30/2019, MD No   Overview Signed 07/03/2019  5:01 PM by 07/05/2019, MD    - on no meds - normal TFTs at NOB. TSI negative at NOB      Rh negative state in antepartum period 06/28/2019 by 06/30/2019, MD No   Hyperthyroidism 08/07/2014 by 08/09/2014, PA-C No      -  GBS/GC/CT collected today  Preterm labor precautions including but not limited to vaginal bleeding, contractions, leaking of fluid and fetal movement were reviewed in detail with the patient.    Return in about 1 week (around 01/30/2020) for ROB (can schedule weekly for four weeks).  Zipporah Plants, CNM, MSN [facility], [facility] 01/23/2020, 10:13 AM

## 2020-01-24 LAB — CERVICOVAGINAL ANCILLARY ONLY
Chlamydia: NEGATIVE
Comment: NEGATIVE
Comment: NORMAL
Neisseria Gonorrhea: NEGATIVE

## 2020-01-27 LAB — STREP GP B CULTURE+RFLX: Strep Gp B Culture+Rflx: NEGATIVE

## 2020-01-30 ENCOUNTER — Ambulatory Visit (INDEPENDENT_AMBULATORY_CARE_PROVIDER_SITE_OTHER): Payer: No Typology Code available for payment source | Admitting: Obstetrics and Gynecology

## 2020-01-30 ENCOUNTER — Other Ambulatory Visit: Payer: Self-pay

## 2020-01-30 ENCOUNTER — Encounter: Payer: Self-pay | Admitting: Obstetrics and Gynecology

## 2020-01-30 VITALS — BP 122/74 | Wt 239.0 lb

## 2020-01-30 DIAGNOSIS — O0993 Supervision of high risk pregnancy, unspecified, third trimester: Secondary | ICD-10-CM

## 2020-01-30 DIAGNOSIS — Z3A37 37 weeks gestation of pregnancy: Secondary | ICD-10-CM

## 2020-01-30 NOTE — Progress Notes (Signed)
Routine Prenatal Care Visit  Subjective  Brenda Villegas is a 29 y.o. G2P1001 at [redacted]w[redacted]d being seen today for ongoing prenatal care.  She is currently monitored for the following issues for this high-risk pregnancy and has Hyperthyroidism; Depression; Supervision of high risk pregnancy, antepartum; H/O Graves' disease; and Rh negative state in antepartum period on their problem list.  ----------------------------------------------------------------------------------- Patient reports increased pelvic pressure, clear vaginal discharge, desires cervical exam..   Contractions: Not present. Vag. Bleeding: None.  Movement: Present. Denies leaking of fluid.  ----------------------------------------------------------------------------------- The following portions of the patient's history were reviewed and updated as appropriate: allergies, current medications, past family history, past medical history, past social history, past surgical history and problem list. Problem list updated.   Objective  Blood pressure 122/74, weight 239 lb (108.4 kg), last menstrual period 04/23/2019. Pregravid weight 197 lb (89.4 kg) Total Weight Gain 42 lb (19.1 kg) Urinalysis:      Fetal Status: Fetal Heart Rate (bpm): 135 Fundal Height: 38 cm Movement: Present  Presentation: Vertex  General:  Alert, oriented and cooperative. Patient is in no acute distress.  Skin: Skin is warm and dry. No rash noted.   Cardiovascular: Normal heart rate noted  Respiratory: Normal respiratory effort, no problems with respiration noted  Abdomen: Soft, gravid, appropriate for gestational age. Pain/Pressure: Present     Pelvic:  Cervical exam performed Dilation: Closed Effacement (%): Thick Station: -2   External: Normal appearing vulva. No lesions noted.  Speculum examination: Normal appearing cervix. No blood in the vaginal vault. White mucoid discharge noted, not pooling at cervical os with Valsalva.     Extremities: Normal  range of motion.  Edema: None  ental Status: Normal mood and affect. Normal behavior. Normal judgment and thought content.     Assessment   29 y.o. G2P1001 at [redacted]w[redacted]d by  02/20/2020, by Ultrasound presenting for routine prenatal visit  Plan   Pregnancy#2 Problems (from 04/23/19 to present)    Problem Noted Resolved   Supervision of high risk pregnancy, antepartum 06/28/2019 by Vena Austria, MD No   Overview Addendum 01/23/2020 10:12 AM by Zipporah Plants, CNM    [facility]inic [facility] P[facility]atal Labs  Dating 6 week ultrasound Blood type: O/Negative/-- (05/20 1121)   Genetic Screen Declines AFP: [ ]  Antibody:Negative (05/20 1121)  Anatomic [birth year]1983 Normal female Rubella: 3.58 (05/20 1121)  Varicella: Immune  GTT Early:               Third 103 trimester:  RPR: Non Reactive (05/20 1121)   Rhogam [x ] 28 weeks. Given 11/23 HBsAg: Negative (05/20 1121)   TDaP vaccine  12/21/19  Flu Shot: 12/21/19 Covid: has not had HIV: Non Reactive (05/20 1121)   Baby Food  Breast GBS:  collected 01/23/20  Contraception  IUD? Pap: 10/06/2017 NILM  CBB     CS/VBAC    Support Person husband           Previous Version   H/O Graves' disease 06/28/2019 by 06/30/2019, MD No   Overview Signed 07/03/2019  5:01 PM by 07/05/2019, MD    - on no meds - normal TFTs at NOB. TSI negative at NOB      Rh negative state in antepartum period 06/28/2019 by 06/30/2019, MD No   Hyperthyroidism 08/07/2014 by 08/09/2014, PA-C No      -Patient desires unmedicated delivery - discussed potential for eIOL at 39w. Patient considering but currently prefers spontaneous onset of labor.  Term  labor precautions including but not limited to vaginal bleeding, contractions, leaking of fluid and fetal movement were reviewed in detail with the patient.    Return in about 1 week (around 02/06/2020) for ROB.  Zipporah Plants, CNM, MSN [facility], [facility] 01/30/2020, 8:33 AM

## 2020-02-06 ENCOUNTER — Encounter: Payer: No Typology Code available for payment source | Admitting: Obstetrics and Gynecology

## 2020-02-06 ENCOUNTER — Other Ambulatory Visit: Payer: Self-pay

## 2020-02-06 ENCOUNTER — Ambulatory Visit (INDEPENDENT_AMBULATORY_CARE_PROVIDER_SITE_OTHER): Payer: No Typology Code available for payment source | Admitting: Obstetrics and Gynecology

## 2020-02-06 VITALS — BP 110/80 | Wt 241.0 lb

## 2020-02-06 DIAGNOSIS — O0990 Supervision of high risk pregnancy, unspecified, unspecified trimester: Secondary | ICD-10-CM

## 2020-02-06 DIAGNOSIS — Z3A38 38 weeks gestation of pregnancy: Secondary | ICD-10-CM

## 2020-02-06 LAB — POCT URINALYSIS DIPSTICK OB
Glucose, UA: NEGATIVE
POC,PROTEIN,UA: NEGATIVE

## 2020-02-06 NOTE — Progress Notes (Signed)
Routine Prenatal Care Visit  Subjective  Brenda Villegas is a 29 y.o. G2P1001 at [redacted]w[redacted]d being seen today for ongoing prenatal care.  She is currently monitored for the following issues for this high-risk pregnancy and has Hyperthyroidism; Supervision of high risk pregnancy, antepartum; H/O Graves' disease; and Rh negative state in antepartum period on their problem list.  ----------------------------------------------------------------------------------- Patient reports generally feeling miserable. Pelvic pressure, irregular contractions. Desires cervical exam today..   Contractions: Irregular. Vag. Bleeding: None.  Movement: Present. Denies leaking of fluid.  ----------------------------------------------------------------------------------- The following portions of the patient's history were reviewed and updated as appropriate: allergies, current medications, past family history, past medical history, past social history, past surgical history and problem list. Problem list updated.   Objective  Blood pressure 110/80, weight 241 lb (109.3 kg), last menstrual period 04/23/2019. Pregravid weight 197 lb (89.4 kg) Total Weight Gain 44 lb (20 kg) Urinalysis:      Fetal Status: Fetal Heart Rate (bpm): 138 Fundal Height: 38 cm Movement: Present  Presentation: Vertex  General:  Alert, oriented and cooperative. Patient is in no acute distress.  Skin: Skin is warm and dry. No rash noted.   Cardiovascular: Normal heart rate noted  Respiratory: Normal respiratory effort, no problems with respiration noted  Abdomen: Soft, gravid, appropriate for gestational age. Pain/Pressure: Present     Pelvic:  Cervical exam performed Dilation: 1 Effacement (%): 40 Station: -2  Extremities: Normal range of motion.  Edema: None  ental Status: Normal mood and affect. Normal behavior. Normal judgment and thought content.     Assessment   29 y.o. G2P1001 at [redacted]w[redacted]d by  02/20/2020, by Ultrasound presenting  for routine prenatal visit  Plan   Pregnancy#2 Problems (from 04/23/19 to present)    Problem Noted Resolved   Supervision of high risk pregnancy, antepartum 06/28/2019 by Vena Austria, MD No   Overview Addendum 02/06/2020 10:46 AM by Zipporah Plants, CNM    [facility]inic [facility] P[facility]atal [facility]  Dating 6 week ultrasound Blood type: O/Negative/-- (05/20 1121)   Genetic Screen Declines AFP: [ ]  Antibody:Negative (10/27 1531)  Anatomic 11-28-1980 Normal female Rubella: 3.58 (05/20 1121)  Varicella: Immune  GTT   Third 103 trimester:  RPR: Non Reactive (10/27 1531)   Rhogam [x ] 28 weeks. Given 11/23 HBsAg: Negative (05/20 1121)   TDaP vaccine  12/21/19                Flu Shot: Covid: has not had HIV: Non Reactive (10/27 1531)   Baby Food  Breast 11-28-1980-- (12/15 1100)   Contraception  POP Pap: 10/06/2017 NILM  CBB     CS/VBAC    Support Person husband           Previous Version   H/O Graves' disease 06/28/2019 by 06/30/2019, MD No   Overview Signed 07/03/2019  5:01 PM by 07/05/2019, MD    - on no meds - normal TFTs at NOB. TSI negative at NOB      Rh negative state in antepartum period 06/28/2019 by 06/30/2019, MD No   Hyperthyroidism 08/07/2014 by 08/09/2014, PA-C No      -Reviewed plan for PP contraception - patient desires POP -Reviewed birth wishes - patient with questions regarding skin to skin care and delayed cord clamping - reviewed standard of care within the practice.  Term labor precautions including but not limited to vaginal bleeding, contractions, leaking of fluid and fetal movement were reviewed in detail with the  patient.    Return in about 1 week (around 02/13/2020) for ROB.  Zipporah Plants, CNM, MSN [facility], [facility] 02/06/2020, 10:47 AM

## 2020-02-09 NOTE — L&D Delivery Note (Signed)
Vaginal Delivery Note  Spontaneous delivery of live viable female infant from the ROA position through an intact perineum. Nuchal cord x 2 reduced. Delivery of anterior left shoulder with gentle downward guidance followed by delivery of the right posterior shoulder with gentle upward guidance. Body followed spontaneously. Infant placed on maternal chest. Nursery present and helped with neonatal resuscitation and evaluation. Cord clamped and cut after one minute. Cord blood colelcted. Placenta delivered spontaneously and intact with a 3 vessel cord.  No laceration. Uterus firm and below umbilicus at the end of the delivery.  Mom and baby recovering in stable condition. Sponge and needle counts were correct at the end of the delivery.  APGARS: 1 minute:7 5 minutes: 9 Weight: pending Epidural present  Adelene Idler MD [facility], [facility] 02/21/20 3:11 PM

## 2020-02-13 ENCOUNTER — Other Ambulatory Visit: Payer: Self-pay

## 2020-02-13 ENCOUNTER — Ambulatory Visit (INDEPENDENT_AMBULATORY_CARE_PROVIDER_SITE_OTHER): Payer: No Typology Code available for payment source | Admitting: Obstetrics and Gynecology

## 2020-02-13 ENCOUNTER — Encounter: Payer: Self-pay | Admitting: Obstetrics and Gynecology

## 2020-02-13 VITALS — BP 120/78 | Wt 243.8 lb

## 2020-02-13 DIAGNOSIS — Z3A39 39 weeks gestation of pregnancy: Secondary | ICD-10-CM

## 2020-02-13 DIAGNOSIS — O26899 Other specified pregnancy related conditions, unspecified trimester: Secondary | ICD-10-CM

## 2020-02-13 DIAGNOSIS — O0993 Supervision of high risk pregnancy, unspecified, third trimester: Secondary | ICD-10-CM

## 2020-02-13 DIAGNOSIS — E0590 Thyrotoxicosis, unspecified without thyrotoxic crisis or storm: Secondary | ICD-10-CM

## 2020-02-13 DIAGNOSIS — Z6791 Unspecified blood type, Rh negative: Secondary | ICD-10-CM

## 2020-02-13 NOTE — Progress Notes (Signed)
Routine Prenatal Care Visit  Subjective  Brenda Villegas is a 30 y.o. G2P1001 at [redacted]w[redacted]d being seen today for ongoing prenatal care.  She is currently monitored for the following issues for this low-risk pregnancy and has Hyperthyroidism; Supervision of high risk pregnancy, antepartum; H/O Graves' disease; and Rh negative state in antepartum period on their problem list.  ----------------------------------------------------------------------------------- Patient reports no complaints.   Contractions: Irregular. Vag. Bleeding: None.  Movement: Present. Leaking Fluid denies.  ----------------------------------------------------------------------------------- The following portions of the patient's history were reviewed and updated as appropriate: allergies, current medications, past family history, past medical history, past social history, past surgical history and problem list. Problem list updated.  Objective  Blood pressure 120/78, weight 243 lb 12.8 oz (110.6 kg), last menstrual period 04/23/2019. Pregravid weight 197 lb (89.4 kg) Total Weight Gain 46 lb 12.8 oz (21.2 kg) Urinalysis: Urine Protein    Urine Glucose    Fetal Status: Fetal Heart Rate (bpm): 135   Movement: Present  Presentation: Vertex  General:  Alert, oriented and cooperative. Patient is in no acute distress.  Skin: Skin is warm and dry. No rash noted.   Cardiovascular: Normal heart rate noted  Respiratory: Normal respiratory effort, no problems with respiration noted  Abdomen: Soft, gravid, appropriate for gestational age. Pain/Pressure: Present     Pelvic:  Cervical exam performed Dilation: 2.5 Effacement (%): 50 Station: -3  Extremities: Normal range of motion.  Edema: None  Mental Status: Normal mood and affect. Normal behavior. Normal judgment and thought content.   Assessment   30 y.o. G2P1001 at [redacted]w[redacted]d by  02/20/2020, by Ultrasound presenting for routine prenatal visit  Plan   Pregnancy#2 Problems (from  04/23/19 to present)    Problem Noted Resolved   Supervision of high risk pregnancy, antepartum 06/28/2019 by Vena Austria, MD No   Overview Addendum 02/06/2020 10:46 AM by Zipporah Plants, CNM    [facility]inic [facility] P[facility]atal [facility]  Dating 6 week ultrasound Blood type: O/Negative/-- (05/20 1121)   Genetic Screen Declines AFP: [ ]  Antibody:Negative (10/27 1531)  Anatomic 11-28-1980 Normal female Rubella: 3.58 (05/20 1121)  Varicella: Immune  GTT   Third 103 trimester:  RPR: Non Reactive (10/27 1531)   Rhogam [x ] 28 weeks. Given 11/23 HBsAg: Negative (05/20 1121)   TDaP vaccine  12/21/19                Flu Shot: Covid: has not had HIV: Non Reactive (10/27 1531)   Baby Food  Breast 11-28-1980-- (12/15 1100)   Contraception  POP Pap: 10/06/2017 NILM  CBB     CS/VBAC    Support Person husband           Previous Version   H/O Graves' disease 06/28/2019 by 06/30/2019, MD No   Overview Signed 07/03/2019  5:01 PM by 07/05/2019, MD    - on no meds - normal TFTs at NOB. TSI negative at NOB      Rh negative state in antepartum period 06/28/2019 by 06/30/2019, MD No   Hyperthyroidism 08/07/2014 by 08/09/2014, PA-C No       Term labor symptoms and general obstetric precautions including but not limited to vaginal bleeding, contractions, leaking of fluid and fetal movement were reviewed in detail with the patient. Please refer to After Visit Summary for other counseling recommendations.   Return in about 1 week (around 02/20/2020) for Routine Prenatal Appointment.   04/19/2020, MD, [facility] Mohair [facility], [facility] 02/13/2020  10:00 AM

## 2020-02-15 ENCOUNTER — Telehealth: Payer: Self-pay

## 2020-02-15 ENCOUNTER — Other Ambulatory Visit: Payer: Self-pay

## 2020-02-15 ENCOUNTER — Encounter: Payer: Self-pay | Admitting: Obstetrics and Gynecology

## 2020-02-15 ENCOUNTER — Observation Stay
Admission: EM | Admit: 2020-02-15 | Discharge: 2020-02-15 | Disposition: A | Discharge status: Home / Self Care | Payer: Medicaid Other | Attending: Obstetrics and Gynecology | Admitting: Obstetrics and Gynecology

## 2020-02-15 DIAGNOSIS — O26893 Other specified pregnancy related conditions, third trimester: Principal | ICD-10-CM | POA: Insufficient documentation

## 2020-02-15 DIAGNOSIS — O99891 Other specified diseases and conditions complicating pregnancy: Secondary | ICD-10-CM | POA: Diagnosis present

## 2020-02-15 DIAGNOSIS — Z3A39 39 weeks gestation of pregnancy: Secondary | ICD-10-CM | POA: Insufficient documentation

## 2020-02-15 DIAGNOSIS — M549 Dorsalgia, unspecified: Secondary | ICD-10-CM | POA: Diagnosis present

## 2020-02-15 DIAGNOSIS — M545 Low back pain, unspecified: Secondary | ICD-10-CM | POA: Diagnosis not present

## 2020-02-15 DIAGNOSIS — Z6791 Unspecified blood type, Rh negative: Secondary | ICD-10-CM

## 2020-02-15 DIAGNOSIS — O26899 Other specified pregnancy related conditions, unspecified trimester: Secondary | ICD-10-CM

## 2020-02-15 DIAGNOSIS — E0590 Thyrotoxicosis, unspecified without thyrotoxic crisis or storm: Secondary | ICD-10-CM

## 2020-02-15 DIAGNOSIS — Z8639 Personal history of other endocrine, nutritional and metabolic disease: Secondary | ICD-10-CM

## 2020-02-15 DIAGNOSIS — O0990 Supervision of high risk pregnancy, unspecified, unspecified trimester: Secondary | ICD-10-CM

## 2020-02-15 LAB — URINALYSIS, COMPLETE (UACMP) WITH MICROSCOPIC
Bilirubin Urine: NEGATIVE
Glucose, UA: NEGATIVE mg/dL
Hgb urine dipstick: NEGATIVE
Ketones, ur: NEGATIVE mg/dL
Nitrite: NEGATIVE
Protein, ur: NEGATIVE mg/dL
Specific Gravity, Urine: 1.011 (ref 1.005–1.030)
pH: 6 (ref 5.0–8.0)

## 2020-02-15 MED ORDER — ACETAMINOPHEN-CODEINE #3 300-30 MG PO TABS
1.0000 | ORAL_TABLET | ORAL | Status: DC | PRN
  Administered 2020-02-15: 1 via ORAL
  Filled 2020-02-15: qty 1

## 2020-02-15 NOTE — OB Triage Note (Signed)
Pt is a G2P1 and [redacted]w[redacted]d reporting to L&D with c/o constant lower back pain since 7am. Pt states she has tried changing positions and hot shower without relief. Pt rates pain 5-7/10 on a 0-10 pain scale. Pt denies VB, LOF, and confirms positive fetal movement. Pt states she had a membrane sweep Wednesday in the office. VSS. Monitors applied and assessing.

## 2020-02-15 NOTE — Telephone Encounter (Signed)
Patient is [redacted]w[redacted]d. Had membranes swept on 1/5 & was 3 cm. Having constant back pain. Stops occasionally. Trying to determine if she should go to the hospital or if there is something else she can do. Her family has h/o back labor. This labor has been different than her first. [phone]

## 2020-02-15 NOTE — Telephone Encounter (Signed)
Spoke w/patient. Advised can try warm bath, heating pad, position change. She did get some relief with warm bath and position change. She has had some ctx, but not close enough to warrant going in her opinion. She will monitor for imminent signs of labor and report to L&D thru ED when she feels she needs to.

## 2020-02-15 NOTE — OB Triage Note (Signed)
Discharge instructions reviewed and pt verbalized understanding. Pt stable at time of discharge.

## 2020-02-15 NOTE — Discharge Summary (Signed)
Physician Discharge Summary  Patient ID: Brenda Villegas MRN: [id number] DOB/AGE: January 09, 1991 29 y.o.  Admit date: 02/15/2020 Discharge date: 02/15/2020  Admission Diagnoses: Back pain affecting pregnancy in the third trimester  Discharge Diagnoses:  Active Problems:   Back pain affecting pregnancy in third trimester [redacted] week gestation.   Discharged Condition: good  Hospital Course: Patient admitted with back pain. Pain resolved with tylenol #3. She declined a prescription. She declined planning IOL.  Reactive NST NST: 140 bpm baseline, moderate variability, 15x15 accelerations, no decelerations. Tocometer : quiet  Consults: None  Significant Diagnostic Studies: labs: UA normal  Treatments: Tylenol #3  Discharge Exam: Blood pressure 139/83, pulse 87, temperature 98 F (36.7 C), temperature source Oral, resp. rate 16, height 5\' 8"  (1.727 m), weight 110.6 kg, last menstrual period 04/23/2019. General appearance: alert and cooperative Resp: clear to auscultation bilaterally Chest wall: no tenderness Breasts: normal appearance, no masses or tenderness Cardio: regular rate and rhythm, S1, S2 normal, no murmur, click, rub or gallop GI: soft, non-tender; bowel sounds normal; no masses,  no organomegaly Extremities: extremities normal, atraumatic, no cyanosis or edema Skin: Skin color, texture, turgor normal. No rashes or lesions  Disposition: Discharge disposition: 01-Home or Self Care        Allergies as of 02/15/2020      Reactions   Penicillins Hives      Medication List    STOP taking these medications   prenatal multivitamin Tabs tablet        Signed: Vallerie Hentz R Arleny Kruger 02/15/2020, 9:36 PM

## 2020-02-20 ENCOUNTER — Ambulatory Visit (INDEPENDENT_AMBULATORY_CARE_PROVIDER_SITE_OTHER): Payer: No Typology Code available for payment source | Admitting: Obstetrics and Gynecology

## 2020-02-20 ENCOUNTER — Other Ambulatory Visit: Payer: Self-pay

## 2020-02-20 VITALS — BP 140/80 | Wt 246.0 lb

## 2020-02-20 DIAGNOSIS — O26899 Other specified pregnancy related conditions, unspecified trimester: Secondary | ICD-10-CM

## 2020-02-20 DIAGNOSIS — O0990 Supervision of high risk pregnancy, unspecified, unspecified trimester: Secondary | ICD-10-CM

## 2020-02-20 DIAGNOSIS — Z6791 Unspecified blood type, Rh negative: Secondary | ICD-10-CM

## 2020-02-20 DIAGNOSIS — E0590 Thyrotoxicosis, unspecified without thyrotoxic crisis or storm: Secondary | ICD-10-CM

## 2020-02-20 DIAGNOSIS — Z8639 Personal history of other endocrine, nutritional and metabolic disease: Secondary | ICD-10-CM

## 2020-02-20 LAB — FETAL NONSTRESS TEST

## 2020-02-20 LAB — POCT URINALYSIS DIPSTICK OB: Glucose, UA: NEGATIVE

## 2020-02-20 NOTE — Progress Notes (Signed)
  [facility] IN[facility] ASSESSMENT SCHEDULING Brenda Villegas Oct 27, 1990 Medical record #: [id number] Phone #:  Home Phone [phone]  Mobile [phone]    [facility]atal Provider:[facility] Proposed admission date/time: 02/21/20 at 0000 Method of induction:Cytotec  Weight: Filed Weights01/12/22 0945Weight:246 lb (111.6 kg) BMI Body mass index is 37.4 kg/m. HIV Negative HSV Negative EDC Estimated Date of Delivery: 1/12/22based on:US at [redacted] wks  Gestational age on admission: 48w1 Gravidity/parity:G2P1001  Cervix Score   0 1 2 3   Position Posterior Midposition Anterior   Consistency Firm Medium Soft   Effacement (%) 0-30 40-50 60-70 >80  Dilation (cm) Closed 1-2 3-4 >5  Baby's station -3 -2 -1 +1, +2   Bishop Score:5   Medical induction of labor  select indication(s) below Elective induction ?39 weeks multiparous patient ?39 weeks primiparous patient with Bishop score ?7 ?40 weeks primiparous patient   Medical Indications Adapted from ACOG Committee Opinion #[id number], "Medically Indicated Late Preterm and Early Term Deliveries," 2013.  PLACENTAL / UTERINE ISSUES FETAL ISSUES MATERNAL ISSUES  ? Placenta previa (36.0-37.6) ? Isoimmunization (37.0-38.6) ? Preeclampsia without severe features or gestational HTN (37.0)  ? Suspected accreta (34.0-35.6) ? Growth Restriction 04-01-1984) ? Preeclampsia with severe features (34.0)  ? Prior classical CD, uterine window, rupture (36.0-37.6) ? Isolated (38.0-39.6) ? Chronic HTN (38.0-39.6)  ? Prior myomectomy (37.0-38.6) ? Concurrent findings (34.0-37.6) ? Cholestasis (37.0)  ? Umbilical vein varix (37.0) ? Growth Restriction (Twins) ? Diabetes  ? Placental abruption (chronic) ? Di-Di Isolated (36.0-37.6) ? Pregestational, controlled (39.0)  OBSTETRIC ISSUES ? Di-Di concurrent findings (32.0-34.6) ? Pregestational, uncontrolled (37.0-39.0)  ? Postdates ? (41 weeks) ? Mo-Di isolated (32.0-34.6)  ? Pregestational, vascular compromise (37.0- 39.0)  ? PPROM (34.0) ? Multiple Gestation ? Gestational, diet controlled (40.0)  ? Hx of IUFD (39.0 weeks) ? Di-Di (38.0-38.6) ? Gestational, med controlled (39.0)  ? Polyhydramnios, mild/moderate; SDV 8-16 or AFI 25-35 (39.0) ? Mo-Di (36.0-37.6) ? Gestational, uncontrolled (38.0-39.0)  ? Oligohydramnios (36.0-37.6); MVP <2 cm  For indications not listed above, delivery recommendations from maternal-fetal medicine consultant occurred on: Date:n/a   Provider Signature: 04-27-1986 Scheduled by: Zipporah Plants  Date:02/20/2020 1:00 PM   Call [phone] to finalize the induction date/time  [phone] (07/17)

## 2020-02-20 NOTE — Progress Notes (Signed)
History and Physical  Brenda Villegas is a 30 y.o. G2P1001 [redacted]w[redacted]d  for Induction of Labor scheduled due to Favorable cervix at term .   Pregnancy course: Uncomplicated. Patient with hx of hyperthyroidism, stable during this pregnancy without medication. TWG in pregnancy 49lb. Rh negative state with Rhogam on 01/01/20.  Patient denies pain, bleeding, ruptured membranes, or other signs of progressing labor at this time. Patient reports continue pelvic and back discomfort related to pregnancy.  PMHx: She  has a past medical history of Allergy and Hyperthyroidism (08/07/2014). Also,  has a past surgical history that includes Knee surgery (Right, 2006) and Tonsillectomy., family history includes Breast cancer in her paternal aunt; Cervical cancer in her maternal aunt; Ovarian cysts in her mother; Thyroid cancer in her maternal aunt.,  reports that she has never smoked. She has never used smokeless tobacco. She reports that she does not drink alcohol and does not use drugs. She has a current medication list which includes the following prescription(s): multivitamin-prenatal and [DISCONTINUED] fluticasone. Also, is allergic to penicillins. OB History  Gravida Para Term Preterm AB Living  2 1 1     1   SAB IAB Ectopic Multiple Live Births          1    # Outcome Date GA Lbr Len/2nd Weight Sex Delivery Anes PTL Lv  2 Current           1 Term 11/06/13    11/08/13 Vag-Spont   LIV  Patient denies any other pertinent gynecologic issues.   Review of Systems  Constitutional: Negative for chills, fever and malaise/fatigue.  HENT: Negative.   Eyes: Negative for blurred vision and double vision.  Respiratory: Negative for cough and shortness of breath.   Cardiovascular: Negative.   Gastrointestinal: Positive for abdominal pain.  Genitourinary: Negative.   Musculoskeletal: Positive for back pain.  Skin: Negative.   Neurological: Negative.  Negative for headaches.  Endo/Heme/Allergies: Negative.    Psychiatric/Behavioral: Negative.   All other systems reviewed and are negative.   Objective: BP 140/80   Wt 246 lb (111.6 kg)   LMP 04/23/2019 (LMP Unknown)   BMI 37.40 kg/m  Physical Exam Constitutional:      Appearance: Normal appearance.  Genitourinary:     Genitourinary Comments: SVE: 2/70/-2/soft/ posterior  HENT:     Head: Normocephalic.     Nose: Nose normal.  Eyes:     Pupils: Pupils are equal, round, and reactive to light.  Cardiovascular:     Rate and Rhythm: Normal rate and regular rhythm.  Pulmonary:     Effort: Pulmonary effort is normal.     Breath sounds: Normal breath sounds.  Musculoskeletal:        General: Normal range of motion.     Cervical back: Normal range of motion.  Neurological:     General: No focal deficit present.     Mental Status: She is alert and oriented to person, place, and time.  Skin:    General: Skin is warm and dry.  Psychiatric:        Mood and Affect: Mood normal.        Behavior: Behavior normal.  Vitals reviewed.     Assessment: Term Pregnancy for Induction of Labor due to Favorable cervix at term.  Plan: Patient will undergo induction of labor with cervical ripening agents.     Patient has been fully informed of the pros and cons, risks and benefits of continued observation with fetal monitoring versus that of  induction of labor.   She understands that there are uncommon risks to induction, which include but are not limited to : frequent or prolonged uterine contractions, fetal distress, uterine rupture, and lack of successful induction.  These risks include all methods including Pitocin and Misoprostol.  Patient understands that using Misoprostol for labor induction is an "off label" indication although it has been studied extensively for this purpose and is an accepted method of induction.  She also has been informed of the increased risks for Cesarean with induction and should induction not be successful.  Patient  consents to the induction plan of management.  Plans to breast feed Plans oral progesterone-only contraceptive for contraception TDaP UTD - 12/21/19  Zipporah Plants, CNM, MSN [facility], [facility] 02/20/2020  1:02 PM

## 2020-02-20 NOTE — Progress Notes (Signed)
Routine Prenatal Care Visit  Subjective  Brenda Villegas is a 30 y.o. G2P1001 at [redacted]w[redacted]d being seen today for ongoing prenatal care.  She is currently monitored for the following issues for this low-risk pregnancy and has Hyperthyroidism; Supervision of high risk pregnancy, antepartum; H/O Graves' disease; Rh negative state in antepartum period; and Back pain affecting pregnancy in third trimester on their problem list.  ----------------------------------------------------------------------------------- Patient reports being miserable, has been having intermittent back pain for days. Desires eIOL. Contractions: Irregular. Vag. Bleeding: None.  Movement: Present. Denies leaking of fluid.  ----------------------------------------------------------------------------------- The following portions of the patient's history were reviewed and updated as appropriate: allergies, current medications, past family history, past medical history, past social history, past surgical history and problem list. Problem list updated.   Objective  Blood pressure 140/80, weight 246 lb (111.6 kg), last menstrual period 04/23/2019. Pregravid weight 197 lb (89.4 kg) Total Weight Gain 49 lb (22.2 kg) Urinalysis:      Fetal Status: Fetal Heart Rate (bpm): 135 Fundal Height: 49 cm Movement: Present  Presentation: Vertex  General:  Alert, oriented and cooperative. Patient is in no acute distress.  Skin: Skin is warm and dry. No rash noted.   Cardiovascular: Normal heart rate noted  Respiratory: Normal respiratory effort, no problems with respiration noted  Abdomen: Soft, gravid, appropriate for gestational age. Pain/Pressure: Present     Pelvic:  Cervical exam deferred Dilation: 2 Effacement (%): 70 Station: -3  Extremities: Normal range of motion.  Edema: None  ental Status: Normal mood and affect. Normal behavior. Normal judgment and thought content.     Assessment   30 y.o. G2P1001 at [redacted]w[redacted]d by   02/20/2020, by Ultrasound presenting for routine prenatal visit  Plan   Pregnancy#2 Problems (from 04/23/19 to present)    Problem Noted Resolved   Supervision of high risk pregnancy, antepartum 06/28/2019 by Vena Austria, MD No   Overview Addendum 02/06/2020 10:46 AM by Zipporah Plants, CNM    Clinic [facility] Prenatal Labs  Dating 6 week ultrasound Blood type: O/Negative/-- (05/20 1121)   Genetic Screen Declines AFP: [ ]  Antibody:Negative (10/27 1531)  Anatomic 11-28-1980 Normal female Rubella: 3.58 (05/20 1121)  Varicella: Immune  GTT   Third 103 trimester:  RPR: Non Reactive (10/27 1531)   Rhogam [x ] 28 weeks. Given 11/23 HBsAg: Negative (05/20 1121)   TDaP vaccine  12/21/19                Flu Shot: Covid: has not had HIV: Non Reactive (10/27 1531)   Baby Food  Breast 11-28-1980-- (12/15 1100)   Contraception  POP Pap: 10/06/2017 NILM  CBB     CS/VBAC    Support Person husband           Previous Version   H/O Graves' disease 06/28/2019 by 06/30/2019, MD No   Overview Signed 07/03/2019  5:01 PM by 07/05/2019, MD    - on no meds - normal TFTs at NOB. TSI negative at NOB      Rh negative state in antepartum period 06/28/2019 by 06/30/2019, MD No   Hyperthyroidism 08/07/2014 by 08/09/2014, PA-C No      Discussed risk vs benefit for eIOL. 30 Patient desire to proceed with eIOL at this time.  Full-term labor precautions including but not limited to vaginal bleeding, contractions, leaking of fluid and fetal movement were reviewed in detail with the patient.    Present to L&D at 0000 at 02/21/20  Zipporah Plants, CNM, MSN [facility], [facility] 02/20/2020, 12:58 PM

## 2020-02-21 ENCOUNTER — Inpatient Hospital Stay: Payer: Medicaid Other | Admitting: Anesthesiology

## 2020-02-21 ENCOUNTER — Other Ambulatory Visit: Payer: Self-pay

## 2020-02-21 ENCOUNTER — Encounter: Payer: Self-pay | Admitting: Obstetrics & Gynecology

## 2020-02-21 ENCOUNTER — Inpatient Hospital Stay
Admission: EM | Admit: 2020-02-21 | Discharge: 2020-02-22 | DRG: 807 | Disposition: A | Discharge status: Home / Self Care | Payer: Medicaid Other | Attending: Obstetrics | Admitting: Obstetrics

## 2020-02-21 DIAGNOSIS — O26893 Other specified pregnancy related conditions, third trimester: Secondary | ICD-10-CM | POA: Diagnosis present

## 2020-02-21 DIAGNOSIS — Z3A40 40 weeks gestation of pregnancy: Secondary | ICD-10-CM | POA: Diagnosis not present

## 2020-02-21 DIAGNOSIS — Z20822 Contact with and (suspected) exposure to covid-19: Secondary | ICD-10-CM | POA: Diagnosis present

## 2020-02-21 DIAGNOSIS — O480 Post-term pregnancy: Secondary | ICD-10-CM | POA: Diagnosis not present

## 2020-02-21 DIAGNOSIS — Z6791 Unspecified blood type, Rh negative: Secondary | ICD-10-CM

## 2020-02-21 DIAGNOSIS — Z3490 Encounter for supervision of normal pregnancy, unspecified, unspecified trimester: Secondary | ICD-10-CM

## 2020-02-21 DIAGNOSIS — O26899 Other specified pregnancy related conditions, unspecified trimester: Secondary | ICD-10-CM

## 2020-02-21 LAB — COMPREHENSIVE METABOLIC PANEL
ALT: 8 U/L (ref 0–44)
AST: 13 U/L — ABNORMAL LOW (ref 15–41)
Albumin: 2.6 g/dL — ABNORMAL LOW (ref 3.5–5.0)
Alkaline Phosphatase: 166 U/L — ABNORMAL HIGH (ref 38–126)
Anion gap: 10 (ref 5–15)
BUN: 6 mg/dL (ref 6–20)
CO2: 19 mmol/L — ABNORMAL LOW (ref 22–32)
Calcium: 8.7 mg/dL — ABNORMAL LOW (ref 8.9–10.3)
Chloride: 109 mmol/L (ref 98–111)
Creatinine, Ser: 0.68 mg/dL (ref 0.44–1.00)
GFR, Estimated: >60 mL/min (ref >60)
Glucose, Bld: 89 mg/dL (ref 70–99)
Potassium: 3.8 mmol/L (ref 3.5–5.1)
Sodium: 138 mmol/L (ref 135–145)
Total Bilirubin: 0.4 mg/dL (ref 0.3–1.2)
Total Protein: 6.4 g/dL — ABNORMAL LOW (ref 6.5–8.1)

## 2020-02-21 LAB — CBC
HCT: 33.5 % — ABNORMAL LOW (ref 36.0–46.0)
Hemoglobin: 11.4 g/dL — ABNORMAL LOW (ref 12.0–15.0)
MCH: 28.5 pg (ref 26.0–34.0)
MCHC: 34 g/dL (ref 30.0–36.0)
MCV: 83.8 fL (ref 80.0–100.0)
Platelets: 123 10*3/uL — ABNORMAL LOW (ref 150–400)
RBC: 4 MIL/uL (ref 3.87–5.11)
RDW: 13.7 % (ref 11.5–15.5)
WBC: 9.5 10*3/uL (ref 4.0–10.5)
nRBC: 0 % (ref 0.0–0.2)

## 2020-02-21 LAB — PROTEIN / CREATININE RATIO, URINE
Creatinine, Urine: 110 mg/dL
Protein Creatinine Ratio: 0.19 mg/mg{Cre} — ABNORMAL HIGH (ref 0.00–0.15)
Total Protein, Urine: 21 mg/dL

## 2020-02-21 LAB — RESP PANEL BY RT-PCR (FLU A&B, COVID) ARPGX2
Influenza A by PCR: NEGATIVE
Influenza B by PCR: NEGATIVE
SARS Coronavirus 2 by RT PCR: NEGATIVE

## 2020-02-21 LAB — RPR: RPR Ser Ql: NONREACTIVE

## 2020-02-21 LAB — ABO/RH: ABO/RH(D): O NEG

## 2020-02-21 MED ORDER — PRENATAL MULTIVITAMIN CH
1.0000 | ORAL_TABLET | Freq: Every day | ORAL | Status: DC
  Administered 2020-02-22: 1 via ORAL
  Filled 2020-02-21: qty 1

## 2020-02-21 MED ORDER — OXYTOCIN 10 UNIT/ML IJ SOLN
INTRAMUSCULAR | Status: AC
  Filled 2020-02-21: qty 2

## 2020-02-21 MED ORDER — TERBUTALINE SULFATE 1 MG/ML IJ SOLN: 0.2500 mg | Freq: Once | INTRAMUSCULAR | Status: DC | PRN

## 2020-02-21 MED ORDER — AMMONIA AROMATIC IN INHA
RESPIRATORY_TRACT | Status: AC
  Filled 2020-02-21: qty 10

## 2020-02-21 MED ORDER — LIDOCAINE HCL (PF) 1 % IJ SOLN: 30.0000 mL | INTRAMUSCULAR | Status: DC | PRN

## 2020-02-21 MED ORDER — LACTATED RINGERS IV SOLN
500.0000 mL | INTRAVENOUS | Status: DC | PRN
  Administered 2020-02-21: 500 mL via INTRAVENOUS

## 2020-02-21 MED ORDER — OXYTOCIN-SODIUM CHLORIDE 30-0.9 UT/500ML-% IV SOLN
1.0000 m[IU]/min | INTRAVENOUS | Status: DC
  Administered 2020-02-21: 2 m[IU]/min via INTRAVENOUS

## 2020-02-21 MED ORDER — LIDOCAINE-EPINEPHRINE (PF) 1.5 %-1:200000 IJ SOLN
INTRAMUSCULAR | Status: DC | PRN
  Administered 2020-02-21: 3 mL via PERINEURAL

## 2020-02-21 MED ORDER — ONDANSETRON HCL 4 MG/2ML IJ SOLN: 4.0000 mg | INTRAMUSCULAR | Status: DC | PRN

## 2020-02-21 MED ORDER — ONDANSETRON HCL 4 MG PO TABS: 4.0000 mg | ORAL_TABLET | ORAL | Status: DC | PRN

## 2020-02-21 MED ORDER — OXYTOCIN-SODIUM CHLORIDE 30-0.9 UT/500ML-% IV SOLN
2.5000 [IU]/h | INTRAVENOUS | Status: DC
  Administered 2020-02-21: 2.5 [IU]/h via INTRAVENOUS
  Filled 2020-02-21: qty 1000

## 2020-02-21 MED ORDER — OXYTOCIN BOLUS FROM INFUSION
333.0000 mL | Freq: Once | INTRAVENOUS | Status: AC
  Administered 2020-02-21: 333 mL via INTRAVENOUS

## 2020-02-21 MED ORDER — BENZOCAINE-MENTHOL 20-0.5 % EX AERO: 1.0000 "application " | INHALATION_SPRAY | CUTANEOUS | Status: DC | PRN

## 2020-02-21 MED ORDER — LIDOCAINE HCL (PF) 1 % IJ SOLN
INTRAMUSCULAR | Status: AC
  Filled 2020-02-21: qty 30

## 2020-02-21 MED ORDER — DOCUSATE SODIUM 100 MG PO CAPS
100.0000 mg | ORAL_CAPSULE | Freq: Two times a day (BID) | ORAL | Status: DC
  Administered 2020-02-21 – 2020-02-22 (×2): 100 mg via ORAL
  Filled 2020-02-21 (×2): qty 1

## 2020-02-21 MED ORDER — FENTANYL 2.5 MCG/ML W/ROPIVACAINE 0.15% IN NS 100 ML EPIDURAL (ARMC)
EPIDURAL | Status: AC
  Filled 2020-02-21: qty 100

## 2020-02-21 MED ORDER — MISOPROSTOL 25 MCG QUARTER TABLET
25.0000 ug | ORAL_TABLET | ORAL | Status: DC | PRN
  Administered 2020-02-21: 25 ug via VAGINAL
  Filled 2020-02-21 (×3): qty 1

## 2020-02-21 MED ORDER — DIBUCAINE (PERIANAL) 1 % EX OINT: 1.0000 "application " | TOPICAL_OINTMENT | CUTANEOUS | Status: DC | PRN

## 2020-02-21 MED ORDER — ONDANSETRON HCL 4 MG/2ML IJ SOLN: 4.0000 mg | Freq: Four times a day (QID) | INTRAMUSCULAR | Status: DC | PRN

## 2020-02-21 MED ORDER — BUPIVACAINE HCL (PF) 0.25 % IJ SOLN
INTRAMUSCULAR | Status: DC | PRN
  Administered 2020-02-21 (×2): 5 mL via EPIDURAL

## 2020-02-21 MED ORDER — IBUPROFEN 600 MG PO TABS
600.0000 mg | ORAL_TABLET | Freq: Four times a day (QID) | ORAL | Status: DC
  Administered 2020-02-22 (×2): 600 mg via ORAL
  Filled 2020-02-21 (×3): qty 1

## 2020-02-21 MED ORDER — ACETAMINOPHEN 500 MG PO TABS
1000.0000 mg | ORAL_TABLET | Freq: Four times a day (QID) | ORAL | Status: DC
  Administered 2020-02-22 (×2): 1000 mg via ORAL
  Filled 2020-02-21 (×3): qty 2

## 2020-02-21 MED ORDER — COCONUT OIL OIL
1.0000 "application " | TOPICAL_OIL | Status: DC | PRN
  Filled 2020-02-21: qty 120

## 2020-02-21 MED ORDER — FENTANYL 2.5 MCG/ML W/ROPIVACAINE 0.15% IN NS 100 ML EPIDURAL (ARMC)
EPIDURAL | Status: DC | PRN
  Administered 2020-02-21: 12 mL/h via EPIDURAL

## 2020-02-21 MED ORDER — ZOLPIDEM TARTRATE 5 MG PO TABS: 5.0000 mg | ORAL_TABLET | Freq: Every evening | ORAL | Status: DC | PRN

## 2020-02-21 MED ORDER — ACETAMINOPHEN 325 MG PO TABS: 650.0000 mg | ORAL_TABLET | ORAL | Status: DC | PRN

## 2020-02-21 MED ORDER — LIDOCAINE HCL (PF) 1 % IJ SOLN
INTRAMUSCULAR | Status: DC | PRN
  Administered 2020-02-21: 3 mL

## 2020-02-21 MED ORDER — MISOPROSTOL 200 MCG PO TABS
ORAL_TABLET | ORAL | Status: AC
  Filled 2020-02-21: qty 4

## 2020-02-21 MED ORDER — SIMETHICONE 80 MG PO CHEW: 80.0000 mg | CHEWABLE_TABLET | ORAL | Status: DC | PRN

## 2020-02-21 MED ORDER — DIPHENHYDRAMINE HCL 25 MG PO CAPS: 25.0000 mg | ORAL_CAPSULE | Freq: Four times a day (QID) | ORAL | Status: DC | PRN

## 2020-02-21 MED ORDER — WITCH HAZEL-GLYCERIN EX PADS: 1.0000 "application " | MEDICATED_PAD | CUTANEOUS | Status: DC | PRN

## 2020-02-21 MED ORDER — SOD CITRATE-CITRIC ACID 500-334 MG/5ML PO SOLN: 30.0000 mL | ORAL | Status: DC | PRN

## 2020-02-21 MED ORDER — LACTATED RINGERS IV SOLN: INTRAVENOUS | Status: DC

## 2020-02-21 NOTE — Anesthesia Preprocedure Evaluation (Signed)
Anesthesia Evaluation  Patient identified by MRN, date of birth, ID band Patient awake    Reviewed: Allergy & Precautions, H&P , NPO status , Patient's Chart, lab work & pertinent test results  History of Anesthesia Complications Negative for: history of anesthetic complications  Airway Mallampati: II  TM Distance: >3 FB Neck ROM: full    Dental no notable dental hx.    Pulmonary neg pulmonary ROS,    Pulmonary exam normal        Cardiovascular negative cardio ROS Normal cardiovascular exam     Neuro/Psych negative neurological ROS  negative psych ROS   GI/Hepatic negative GI ROS, Neg liver ROS,   Endo/Other  negative endocrine ROS  Renal/GU negative Renal ROS  negative genitourinary   Musculoskeletal   Abdominal   Peds  Hematology negative hematology ROS (+)   Anesthesia Other Findings   Reproductive/Obstetrics (+) Pregnancy                             Anesthesia Physical Anesthesia Plan  ASA: II  Anesthesia Plan:    Post-op Pain Management:    Induction:   PONV Risk Score and Plan:   Airway Management Planned:   Additional Equipment:   Intra-op Plan:   Post-operative Plan:   Informed Consent: I have reviewed the patients History and Physical, chart, labs and discussed the procedure including the risks, benefits and alternatives for the proposed anesthesia with the patient or authorized representative who has indicated his/her understanding and acceptance.       Plan Discussed with: CRNA  Anesthesia Plan Comments:         Anesthesia Quick Evaluation

## 2020-02-21 NOTE — Anesthesia Procedure Notes (Signed)
Epidural Patient location during procedure: OB Start time: 02/21/2020 7:00 AM End time: 02/21/2020 7:15 AM  Staffing Resident/CRNA: Junious Silk, CRNA Performed: resident/CRNA   Preanesthetic Checklist Completed: patient identified, IV checked, site marked, risks and benefits discussed, surgical consent, monitors and equipment checked, pre-op evaluation and timeout performed  Epidural Patient position: sitting Prep: Betadine Patient monitoring: heart rate, continuous pulse ox and blood pressure Approach: midline Location: L3-L4 Injection technique: LOR saline  Needle:  Needle type: Tuohy  Needle gauge: 17 G Needle length: 9 cm and 9 Needle insertion depth: 5 cm Catheter type: closed end flexible Catheter size: 20 Guage Test dose: negative and 1.5% lidocaine with Epi 1:200 K  Assessment Sensory level: T10 Events: blood not aspirated, injection not painful, no injection resistance, no paresthesia and negative IV test  Additional Notes   Patient tolerated the insertion well without complications.-SATD -IVTD. No paresthesia. Refer to [facility] nursing for VS and dosingReason for block:procedure for pain

## 2020-02-21 NOTE — Progress Notes (Signed)
Labor Progress Note  Brenda Villegas is a 30 y.o. G2P1001 at [redacted]w[redacted]d by ultrasound admitted for induction of labor due to Elective at term.  Subjective:  Patient resting comfortably in bed. Reports vaginal pressure but denies sensation of contractions following epidural placement.  Objective: BP 123/75 (BP Location: Left Arm)   Pulse 64   Temp 97.8 F (36.6 C) (Oral)   Resp 17   Ht 5\' 8"  (1.727 m)   Wt 111.6 kg   LMP 04/23/2019 (LMP Unknown)   SpO2 96%   BMI 37.40 kg/m  Notable VS details: wnl  Fetal Assessment: FHT:  FHR: 125 bpm, variability: moderate,  accelerations:  Present,  decelerations:  Absent Category/reactivity:  Category I UC:   irregular, every 1-5 minutes SVE: deferred   Membrane status: intact Amniotic color: clear  Labs: Lab Results  Component Value Date   WBC 9.5 02/21/2020   HGB 11.4 (L) 02/21/2020   HCT 33.5 (L) 02/21/2020   MCV 83.8 02/21/2020   PLT 123 (L) 02/21/2020    Assessment / Plan: Induction of labor due to term with favorable cervix,  progressing well on following single dose of cytotec  Labor: will reassess and start pitocin as indicated Preeclampsia:  no S&S of PIH, continue to monitor BP Fetal Wellbeing:  Category I Pain Control:  Epidural I/D:  GBS negative Anticipated MOD:  NSVD  02/23/2020, CNM 02/21/2020, 10:57 AM

## 2020-02-21 NOTE — Plan of Care (Signed)
Pt. arrived to L&D for induction or labor. Monitors applied and assessing. Providing education regarding labor, delivery, and general infant care.

## 2020-02-21 NOTE — Progress Notes (Signed)
Brenda Villegas is a 30 y.o. G2P1001 at [redacted]w[redacted]d by LMP admitted for induction of labor due to Elective at term.  Subjective: she received one dose of Cytotec and has recently become very uncomfortable. Tearful, Was earlier trying not to  Use pain medication , but now requesting epidural anesthesia.   Objective: BP 118/66 (BP Location: Left Arm)   Pulse 65   Temp 97.9 F (36.6 C) (Oral)   Resp 14   LMP 04/23/2019 (LMP Unknown)  No intake/output data recorded. No intake/output data recorded.  FHT:  FHR: 130s bpm, variability: moderate,  accelerations:  Present,  decelerations:  Absent UC:   regular, every 2-3 minutes SVE:   Dilation: 5.5 Effacement (%): 70 Station: -2 Exam by:: Marzetta Merino, RN  Labs: Lab Results  Component Value Date   WBC 9.5 02/21/2020   HGB 11.4 (L) 02/21/2020   HCT 33.5 (L) 02/21/2020   MCV 83.8 02/21/2020   PLT 123 (L) 02/21/2020    Assessment / Plan: Induction of labor due to term with favorable cervix,  progressing well on pitocin  Labor: Progressing normally  Fetal Wellbeing:  Category I Pain Control:  desires epidural I/D:  n/a Anticipated MOD:  NSVD  Anesthesia contacted for epidural.  Mirna Mires 02/21/2020, 7:05 AM

## 2020-02-21 NOTE — Discharge Summary (Signed)
Postpartum Discharge Summary  Date of Service updated: 02/22/2020     Patient Name: Brenda Villegas DOB: Dec 01, 1990 MRN: [id number]  Date of admission: 02/21/2020 Delivery date:02/21/2020  Delivering provider: Adrian Prows R  Date of discharge: 02/22/2020  Admitting diagnosis: Pregnancy [Z34.90] Intrauterine pregnancy: [redacted]w[redacted]d    Secondary diagnosis:  Active Problems:   Rh negative state in antepartum period   Pregnancy   Postpartum care following vaginal delivery   Encounter for care or examination of lactating mother  Additional problems: none    Discharge diagnosis: Term Pregnancy Delivered                                              Post partum procedures: none Augmentation: AROM and Cytotec Complications: None  Hospital course: Induction of Labor With Vaginal Delivery   30y.o. yo G2P2002 at 480w1das admitted to the hospital 02/21/2020 for induction of labor.  Indication for induction: Favorable cervix at term.  Patient had an uncomplicated labor course as follows: Membrane Rupture Time/Date: 12:51 PM ,02/21/2020   Delivery Method:Vaginal, Spontaneous  Episiotomy: None  Lacerations:  None  Details of delivery can be found in separate delivery note.  Patient had a routine postpartum course. Patient is discharged home 02/22/20.  Newborn Data: Birth date:02/21/2020  Birth time:2:07 PM  Gender:Female  Living status:Living  Apgars:7 ,9  Weight:4160 g   Magnesium Sulfate received: No BMZ received: No Rhophylac: No- baby is Rh negative MMR:No T-DaP:Given prenatally Flu: No Transfusion:No  Physical exam  Vitals:   02/21/20 1951 02/21/20 2336 02/22/20 0345 02/22/20 0817  BP: 135/77 128/88 129/82 129/87  Pulse: 67 76 69 63  Resp: _0 Temp: 98.2 F (36.8 C) 98 F (36.7 C) 97.8 F (36.6 C) 97.7 F (36.5 C)  TempSrc: Oral Oral Oral Oral  SpO2: 99% 97% 97% 99%  Weight:      Height:       General: alert, cooperative and no distress Lochia:  appropriate Uterine Fundus: firm Incision: N/A DVT Evaluation: No evidence of DVT seen on physical exam. Labs: Lab Results  Component Value Date   WBC 11.3 (H) 02/22/2020   HGB 11.2 (L) 02/22/2020   HCT 32.7 (L) 02/22/2020   MCV 83.4 02/22/2020   PLT 108 (L) 02/22/2020   CMP Latest Ref Rng & Units 02/21/2020  Glucose 70 - 99 mg/dL 89  BUN 6 - 20 mg/dL 6  Creatinine 0.44 - 1.00 mg/dL 0.68  Sodium 135 - 145 mmol/L 138  Potassium 3.5 - 5.1 mmol/L 3.8  Chloride 98 - 111 mmol/L 109  CO2 22 - 32 mmol/L 19(L)  Calcium 8.9 - 10.3 mg/dL 8.7(L)  Total Protein 6.5 - 8.1 g/dL 6.4(L)  Total Bilirubin 0.3 - 1.2 mg/dL 0.4  Alkaline Phos 38 - 126 U/L 166(H)  AST 15 - 41 U/L 13(L)  ALT 0 - 44 U/L 8   Edinburgh Score: Edinburgh Postnatal Depression Scale Screening Tool 02/22/2020  I have been able to laugh and see the funny side of things. 0  I have looked forward with enjoyment to things. 0  I have blamed myself unnecessarily when things went wrong. 2  I have been anxious or worried for no good reason. 1  I have felt scared or panicky for no good reason. 1  Things have been getting on top of  me. 1  I have been so unhappy that I have had difficulty sleeping. 0  I have felt sad or miserable. 1  I have been so unhappy that I have been crying. 0  The thought of harming myself has occurred to me. 0  Edinburgh Postnatal Depression Scale Total 6      After visit meds:  Allergies as of 02/22/2020      Reactions   Penicillins Hives      Medication List    TAKE these medications   multivitamin-prenatal 27-0.8 MG Tabs tablet Take 1 tablet by mouth daily at 12 noon.        Discharge home in stable condition Infant Feeding: Breast Infant Disposition:home with mother Discharge instruction: per After Visit Summary and Postpartum booklet. Activity: Advance as tolerated. Pelvic rest for 6 weeks.  Diet: routine diet Anticipated Birth Control: Mirena IUD vs POPs Postpartum Appointment:2  weeks Additional Postpartum F/U: 2 week and 6 week visits Future Appointments:No future appointments. Follow up Visit:  Follow-up Information    Schuman, Christanna R, MD Follow up in 2 week(s).   Specialty: Obstetrics and Gynecology Contact information: [address] [place] [address] [phone]               02/22/2020 Rod Can, CNM

## 2020-02-22 LAB — CBC
HCT: 32.7 % — ABNORMAL LOW (ref 36.0–46.0)
Hemoglobin: 11.2 g/dL — ABNORMAL LOW (ref 12.0–15.0)
MCH: 28.6 pg (ref 26.0–34.0)
MCHC: 34.3 g/dL (ref 30.0–36.0)
MCV: 83.4 fL (ref 80.0–100.0)
Platelets: 108 10*3/uL — ABNORMAL LOW (ref 150–400)
RBC: 3.92 MIL/uL (ref 3.87–5.11)
RDW: 13.9 % (ref 11.5–15.5)
WBC: 11.3 10*3/uL — ABNORMAL HIGH (ref 4.0–10.5)
nRBC: 0 % (ref 0.0–0.2)

## 2020-02-22 LAB — TYPE AND SCREEN
ABO/RH(D): O NEG
Antibody Screen: POSITIVE

## 2020-02-22 NOTE — Discharge Instructions (Signed)
Discharge Instructions:   Follow-up Appointment: 2 weeks, please call the office to schedule  If there are any new medications, they have been ordered and will be available for pickup at the listed pharmacy on your way home from the hospital.   Call the office if you have any of the following: headache, visual changes, fever >101.0 F, chills, shortness of breath, breast concerns, excessive vaginal bleeding, incision drainage or problems, leg pain or redness, depression or any other concerns. If you have vaginal discharge with an odor, let your doctor know.   It is normal to bleed for up to 6 weeks. You should not soak through more than 1 pad in 1 hour. If you have a blood clot larger than your fist with continued bleeding, call your doctor.   Activity: Do not lift > 15 lbs for 6 weeks (do not lift anything heavier than your baby). No intercourse, tampons, swimming pools, hot tubs, baths (only showers) for 6 weeks.  No driving for 1-2 weeks. Do not drive while taking narcotic or opioid pain medication.  Continue taking your prenatal vitamin, especially if breastfeeding. Increase calories and fluids (water) while breastfeeding.   Your milk will come in, in the next couple of days (right now it is colostrum). You may have a slight fever when your milk comes in, but it should go away on its own.  If it does not, and rises above 101 F please call the doctor. You will also feel achy and your breasts will be firm. They will also start to leak. If you are breastfeeding, continue as you have been and you can pump/express milk for comfort.   If you have too much milk, your breasts can become engorged, which could lead to mastitis. This is an infection of the milk ducts. It can be very painful and you will need to notify your doctor to obtain a prescription for antibiotics. You can also treat it with a shower or hot/cold compress.   For concerns about your baby, please call your pediatrician.  For  breastfeeding concerns, the lactation consultant can be reached at [phone].   Postpartum blues (feelings of happy one minute and sad another minute) are normal for the first few weeks but if it gets worse let your doctor know.   Congratulations! We enjoyed caring for you and your new bundle of joy!    Postpartum Care After Vaginal Delivery The following information offers guidance about how to care for yourself from the time you deliver your baby to 6-12 weeks after delivery (postpartum period). If you have problems or questions, contact your health care provider for more specific instructions. Follow these instructions at home: Vaginal bleeding  It is normal to have vaginal bleeding (lochia) after delivery. Wear a sanitary pad for bleeding and discharge. ? During the first week after delivery, the amount and appearance of lochia is often similar to a menstrual period. ? Over the next few weeks, it will gradually decrease to a dry, yellow-brown discharge. ? For most women, lochia stops completely by 4-6 weeks after delivery, but can vary.  Change your sanitary pads frequently. Watch for any changes in your flow, such as: ? A sudden increase in volume. ? A change in color. ? Large blood clots.  If you pass a blood clot from your vagina, save it and call your health care provider. Do not flush blood clots down the toilet before talking with your health care provider.  Do not use tampons or douches  until your health care provider approves.  If you are not breastfeeding, your period should return 6-8 weeks after delivery. If you are feeding your baby breast milk only, your period may not return until you stop breastfeeding. Perineal care  Keep the area between the vagina and the anus (perineum) clean and dry. Use medicated pads and pain-relieving sprays and creams as directed.  If you had a surgical cut in the perineum (episiotomy) or a tear, check the area for signs of infection  until you are healed. Check for: ? More redness, swelling, or pain. ? Fluid or blood coming from the cut or tear. ? Warmth. ? Pus or a bad smell.  You may be given a squirt bottle to use instead of wiping to clean the perineum area after you use the bathroom. Pat the area gently to dry it.  To relieve pain caused by an episiotomy, a tear, or swollen veins in the anus (hemorrhoids), take a warm sitz bath 2-3 times a day. In a sitz bath, the warm water should only come up to your hips and cover your buttocks.   Breast care  In the first few days after delivery, your breasts may feel heavy, full, and uncomfortable (breast engorgement). Milk may also leak from your breasts. Ask your health care provider about ways to help relieve the discomfort.  If you are breastfeeding: ? Wear a bra that supports your breasts and fits well. Use breast pads to absorb milk that leaks. ? Keep your nipples clean and dry. Apply creams and ointments as told. ? You may have uterine contractions every time you breastfeed for up to several weeks after delivery. This helps your uterus return to its normal size. ? If you have any problems with breastfeeding, notify your health care provider or lactation consultant.  If you are not breastfeeding: ? Avoid touching your breasts. Do not squeeze out (express) milk. Doing this can make your breasts produce more milk. ? Wear a good-fitting bra and use cold packs to help with swelling. Intimacy and sexuality  Ask your health care provider when you can engage in sexual activity. This may depend upon: ? Your risk of infection. ? How fast you are healing. ? Your comfort and desire to engage in sexual activity.  You are able to get pregnant after delivery, even if you have not had your period. Talk with your health care provider about methods of birth control (contraception) or family planning if you desire future pregnancies. Medicines  Take over-the-counter and prescription  medicines only as told by your health care provider.  Take an over-the-counter stool softener to help ease bowel movements as told by your health care provider.  If you were prescribed an antibiotic medicine, take it as told by your health care provider. Do not stop taking the antibiotic even if you start to feel better.  Review all previous and current prescriptions to check for possible transfer into breast milk. Activity  Gradually return to your normal activities as told by your health care provider.  Rest as much as possible. Nap while your baby is sleeping. Eating and drinking  Drink enough fluid to keep your urine pale yellow.  To help prevent or relieve constipation, eat high-fiber foods every day.  Choose healthy eating to support breastfeeding or weight loss goals.  Take your prenatal vitamins until your health care provider tells you to stop.   General tips/recommendations  Do not use any products that contain nicotine or tobacco. These products  include cigarettes, chewing tobacco, and vaping devices, such as e-cigarettes. If you need help quitting, ask your health care provider.  Do not drink alcohol, especially if you are breastfeeding.  Do not take medications or drugs that are not prescribed to you, especially if you are breastfeeding.  Visit your health care provider for a postpartum checkup within the first 3-6 weeks after delivery.  Complete a comprehensive postpartum visit no later than 12 weeks after delivery.  Keep all follow-up visits for you and your baby. Contact a health care provider if:  You feel unusually sad or worried.  Your breasts become red, painful, or hard.  You have a fever or other signs of an infection.  You have bleeding that is soaking through one pad an hour or you have blood clots.  You have a severe headache that doesn't go away or you have vision changes.  You have nausea and vomiting and are unable to eat or drink anything for  24 hours. Get help right away if:  You have chest pain or difficulty breathing.  You have sudden, severe leg pain.  You faint or have a seizure.  You have thoughts about hurting yourself or your baby. If you ever feel like you may hurt yourself or others, or have thoughts about taking your own life, get help right away. Go to your nearest emergency department or:  Call your local emergency services (911 in the [place]  The National Suicide Prevention Lifeline at (214) 512-3209. This suicide crisis helpline is open 24 hours a day.  Text the Crisis Text Line at 857 380 5489 (in the [place] Summary  The period of time after you deliver your newborn up to 6-12 weeks after delivery is called the postpartum period.  Keep all follow-up visits for you and your baby.  Review all previous and current prescriptions to check for possible transfer into breast milk.  Contact a health care provider if you feel unusually sad or worried during the postpartum period. This information is not intended to replace advice given to you by your health care provider. Make sure you discuss any questions you have with your health care provider. Document Revised: 10/11/2019 Document Reviewed: 10/11/2019 Elsevier Patient Education  2021 [employer].

## 2020-02-22 NOTE — Progress Notes (Signed)
Pt discharged with infant. Discharge instructions, prescriptions, and follow up appointments given to and reviewed with patient. Pt verbalized understanding. To be escorted out by auxillary.  °

## 2020-02-22 NOTE — Lactation Note (Addendum)
This note was copied from a baby's chart. Lactation Consultation Note  Patient Name: Brenda Villegas CLEXN'T Date: 02/22/2020 Reason for consult: Follow-up assessment;Term Age:30 hours  Maternal Data Formula Feeding for Exclusion: No Has patient been taught Hand Expression?: Yes Does the patient have breastfeeding experience prior to this delivery?: Yes  Feeding Feeding Type: Breast Fed BAby has not breast fed since circ, very sleepy, baby awakened by placing in bassinet, mom turned to left side lying position, baby at first could not coordinate tongue to latch well but then was able to latch and nursed well on both breasts x 30 min, occ swallow noted LATCH Score Latch: Grasps breast easily, tongue down, lips flanged, rhythmical sucking.  Audible Swallowing: A few with stimulation  Type of Nipple: Everted at rest and after stimulation  Comfort (Breast/Nipple): Filling, red/small blisters or bruises, mild/mod discomfort  Hold (Positioning): Assistance needed to correctly position infant at breast and maintain latch.  LATCH Score: 7  Interventions Interventions: Breast feeding basics reviewed;Assisted with latch;Skin to skin;Hand express;Adjust position;Coconut oil  Lactation Tools Discussed/Used WIC Program: No LC name and no written on white board  Consult Status Consult Status: PRN    [name] 02/22/2020, 3:13 PM

## 2020-02-24 NOTE — Anesthesia Postprocedure Evaluation (Signed)
Anesthesia Post Note  Patient: Brenda Villegas  Procedure(s) Performed: AN AD HOC LABOR EPIDURAL  Anesthetic complications: no Comments: Pt discharged prior to being seen.   No complications documented.   Last Vitals: There were no vitals filed for this visit.  Last Pain: There were no vitals filed for this visit.               Laraina Sulton K

## 2020-03-07 ENCOUNTER — Encounter: Payer: Self-pay | Admitting: Obstetrics and Gynecology

## 2020-03-07 ENCOUNTER — Ambulatory Visit (INDEPENDENT_AMBULATORY_CARE_PROVIDER_SITE_OTHER): Payer: Medicaid Other | Admitting: Obstetrics and Gynecology

## 2020-03-07 ENCOUNTER — Other Ambulatory Visit: Payer: Self-pay

## 2020-03-07 DIAGNOSIS — Z30011 Encounter for initial prescription of contraceptive pills: Secondary | ICD-10-CM

## 2020-03-07 DIAGNOSIS — Z3009 Encounter for other general counseling and advice on contraception: Secondary | ICD-10-CM

## 2020-03-07 MED ORDER — NORETHINDRONE 0.35 MG PO TABS
1.0000 | ORAL_TABLET | Freq: Every day | ORAL | 0 refills | Status: DC
Start: 2020-03-07 — End: 2020-04-04

## 2020-03-07 NOTE — Progress Notes (Signed)
  OBSTETRICS POSTPARTUM CLINIC PROGRESS NOTE  Subjective:     Brenda Villegas is a 30 y.o. [phone] female who presents for a postpartum visit. She is 2 weeks postpartum following a Term pregnancy and delivery by Vaginal, no problems at delivery.  I have fully reviewed the prenatal and intrapartum course. Anesthesia: epidural.  Postpartum course has been complicated by uncomplicated.  Baby is feeding by Bottle.  Bleeding: patient has not  resumed menses.  Bowel function is normal. Bladder function is normal.  Patient is not sexually active. Contraception method desired is oral progesterone-only contraceptive.  Postpartum depression screening: negative. Edinburgh 4.  The following portions of the patient's history were reviewed and updated as appropriate: allergies, current medications, past family history, past medical history, past social history, past surgical history and problem list.  Review of Systems Pertinent items are noted in HPI.  Objective:    BP 112/70   Ht 5\' 8"  (1.727 m)   Wt 218 lb 3.2 oz (99 kg)   Breastfeeding No   BMI 33.18 kg/m   General:  alert and no distress   Breasts:  inspection negative, no nipple discharge or bleeding, no masses or nodularity palpable  Lungs: clear to auscultation bilaterally  Heart:  regular rate and rhythm, S1, S2 normal, no murmur, click, rub or gallop  Abdomen: soft, non-tender; bowel sounds normal; no masses,  no organomegaly.           Assessment:  Post Partum Care visit 1. Postpartum care and examination    2. Birth control counseling   - norethindrone (MICRONOR) 0.35 MG tablet; Take 1 tablet (0.35 mg total) by mouth daily.  Dispense: 28 tablet; Refill: 0  3. BCP (birth control pills) initiation   - norethindrone (MICRONOR) 0.35 MG tablet; Take 1 tablet (0.35 mg total) by mouth daily.  Dispense: 28 tablet; Refill: 0   Plan:  See orders and Patient Instructions Follow up in: 4 weeks or as needed.   MD, Adelene Idler OB/GYN, [facility] 03/07/2020 10:24 AM

## 2020-03-07 NOTE — Progress Notes (Signed)
PPC

## 2020-04-04 ENCOUNTER — Ambulatory Visit (INDEPENDENT_AMBULATORY_CARE_PROVIDER_SITE_OTHER): Payer: Medicaid Other | Admitting: Obstetrics and Gynecology

## 2020-04-04 ENCOUNTER — Encounter: Payer: Self-pay | Admitting: Obstetrics and Gynecology

## 2020-04-04 ENCOUNTER — Other Ambulatory Visit: Payer: Self-pay

## 2020-04-04 VITALS — BP 126/70 | Ht 68.0 in | Wt 226.6 lb

## 2020-04-04 DIAGNOSIS — Z3041 Encounter for surveillance of contraceptive pills: Secondary | ICD-10-CM

## 2020-04-04 MED ORDER — DESOGESTREL-ETHINYL ESTRADIOL 0.15-30 MG-MCG PO TABS
1.0000 | ORAL_TABLET | Freq: Every day | ORAL | 11 refills | Status: DC
Start: 2020-04-04 — End: 2021-04-15

## 2020-04-04 NOTE — Progress Notes (Signed)
Postpartum Visit  Chief Complaint:  Chief Complaint  Patient presents with  . Postpartum Care    History of Present Illness: Patient is a 30 y.o. [id number] presents for postpartum visit.  Review the Delivery Report for details.  Date of delivery:  This patient has no babies on file. Type of delivery: Vaginal delivery - Vacuum or forceps assisted  no Episiotomy No.  Laceration: no  Pregnancy or labor problems:  no  Breast Feeding:  no Lochia: none Post partum depression/anxiety noted:  no Edinburgh Post-Partum Depression Score:  6  Date of last PAP: 2019 NIL  Any problems since the delivery:  no  Newborn Details:  SINGLETON :  1. BabyGender:female. Birth weight: This patient has no babies on file. Infant Status: Infant doing well at home with mother.   Review of Systems: Review of Systems  Constitutional: Negative for chills, fever, malaise/fatigue and weight loss.  HENT: Negative for congestion, hearing loss and sinus pain.   Eyes: Negative for blurred vision and double vision.  Respiratory: Negative for cough, sputum production, shortness of breath and wheezing.   Cardiovascular: Negative for chest pain, palpitations, orthopnea and leg swelling.  Gastrointestinal: Negative for abdominal pain, constipation, diarrhea, nausea and vomiting.  Genitourinary: Negative for dysuria, flank pain, frequency, hematuria and urgency.  Musculoskeletal: Negative for back pain, falls and joint pain.  Skin: Negative for itching and rash.  Neurological: Negative for dizziness and headaches.  Psychiatric/Behavioral: Negative for depression, substance abuse and suicidal ideas. The patient is not nervous/anxious.      Past Medical History:  Past Medical History:  Diagnosis Date  . Allergy   . Hyperthyroidism 08/07/2014    Past Surgical History:  Past Surgical History:  Procedure Laterality Date  . KNEE SURGERY Right 2006  . TONSILLECTOMY     At age 6    Family History:   Family History  Problem Relation Age of Onset  . Ovarian cysts Mother        removed cyst on one side in 2018  . Thyroid cancer Maternal Aunt   . Breast cancer Paternal Aunt        ? age  . Breast cancer Maternal Grandmother     Social History:  Social History   Socioeconomic History  . Marital status: Married    Spouse name: Josh  . Number of children: Not on file  . Years of education: Not on file  . Highest education level: Not on file  Occupational History  . Not on file  Tobacco Use  . Smoking status: Never Smoker  . Smokeless tobacco: Never Used  Vaping Use  . Vaping Use: Some days  Substance and Sexual Activity  . Alcohol use: No    Alcohol/week: 0.0 standard drinks  . Drug use: No  . Sexual activity: Yes    Birth control/protection: Other-see comments    Comment: undecided   Other Topics Concern  . Not on file  Social History Narrative  . Not on file   Social Determinants of Health   Financial Resource Strain: Not on file  Food Insecurity: Not on file  Transportation Needs: Not on file  Physical Activity: Not on file  Stress: Not on file  Social Connections: Not on file  Intimate Partner Violence: Not on file    Allergies:  Allergies  Allergen Reactions  . Penicillins Hives    Medications: Prior to Admission medications   Medication Sig Start Date End Date Taking? Authorizing Provider  desogestrel-ethinyl  estradiol (APRI) 0.15-30 MG-MCG tablet Take 1 tablet by mouth daily. 04/04/20  Yes Jaedin Trumbo, Jaquelyn Bitter, MD  Prenatal Vit-Fe Fumarate-FA (MULTIVITAMIN-PRENATAL) 27-0.8 MG TABS tablet Take 1 tablet by mouth daily at 12 noon. Patient not taking: No sig reported    [provider]  fluticasone (FLONASE) 50 MCG/ACT nasal spray Place 1 spray into both nostrils daily. 08/13/19 08/28/19  Merrilee Jansky, MD    Physical Exam Vitals:  Vitals:   04/04/20 0902  BP: 126/70    Physical Exam Constitutional:      Appearance: She is  well-developed.  HENT:     Head: Normocephalic and atraumatic.  Neck:     Thyroid: No thyromegaly.  Cardiovascular:     Rate and Rhythm: Normal rate and regular rhythm.     Heart sounds: Normal heart sounds.  Pulmonary:     Effort: Pulmonary effort is normal.     Breath sounds: Normal breath sounds.  Abdominal:     General: Bowel sounds are normal. There is no distension.     Palpations: Abdomen is soft. There is no mass.  Musculoskeletal:     Cervical back: Neck supple.  Neurological:     Mental Status: She is alert and oriented to person, place, and time.  Skin:    General: Skin is warm and dry.  Psychiatric:        Behavior: Behavior normal.        Thought Content: Thought content normal.        Judgment: Judgment normal.  Vitals reviewed.     Assessment: 30 y.o. [id number] presenting for 6 week postpartum visit  Plan: Problem List Items Addressed This Visit   None   Visit Diagnoses    Encounter for birth control pills maintenance    -  Primary   Relevant Medications   desogestrel-ethinyl estradiol (APRI) 0.15-30 MG-MCG tablet       1) Contraception Education given regarding options for contraception, including oral contraceptives.  2)  Pap: ASCCP guidelines and rational discussed.  Patient opts for repeating in the summer.  3) Patient underwent screening for postpartum depression with no concerns noted.   - Follow up 6 months for routine annual exam  Adelene Idler MD, Merlinda Frederick OB/GYN, [facility] 04/04/2020 9:46 AM

## 2020-04-04 NOTE — Patient Instructions (Signed)
Perinatal Mood and Anxiety Disorder Perinatal mood and anxiety disorder (PMAD) is a mental health condition that happens when a person feels excessive sadness, anger, or worry and tension (anxiety) during pregnancy or during the first few months after the birth. This condition can last a few months or may continue for years if left untreated. PMAD may cause serious problems for the mother, her baby, or the father if not properly managed. Depression and anxiety can interfere with the ability to take care of the baby. It also may affect work, school, relationships, and other everyday activities. Having the baby blues is considered normal. Mild to moderate levels of sadness, exhaustion, and generally struggling with being a parent are considered the blues. Many parents experience these during the first 1-2 weeks after giving birth. If these symptoms become worse or last too long, it may be PMAD. What are the causes? The exact cause of this condition is not known. It may result from a combination of hormone changes and biological, social, and psychological factors. What increases the risk? The following factors may make you more likely to develop this condition:  Having a personal or family history of depression, anxiety, or mood disorders.  Experiencing a stressful life event during pregnancy, such as the death of a loved one.  Having additional life stress, such as being a single parent.  Having thyroid problems. What are the signs or symptoms? Symptoms of this condition include:  Physical symptoms, such as: ? Panic attacks. These are intense episodes of fear or discomfort that may also cause sweating, nausea, shortness of breath, or fear of dying. They usually last 5-15 minutes but can last longer. ? Performing repetitive tasks to relieve stress or worry (obsessive compulsive disorder, or OCD). ? Problems with appetite or sleep.  Emotional symptoms, such as: ? Excessive worry about problems or  feeling like something bad will happen (generalized anxiety disorder). ? Phobias, which are fears of certain objects or situations. ? Separation anxiety, or fear and stress about leaving certain people or loved ones.  Behavioral symptoms, such as: ? Depression, or lack of motivation and energy. ? Intense mood swings involving emotional highs and lows. ? Feeling out of control or like you are going crazy. ? Having difficulty bonding with your baby. Some people also have trouble relaxing, problems concentrating, problems sleeping, frequent nightmares, and disturbing thoughts. PMAD can be different for everyone and can affect men as well as women. How is this diagnosed? This condition is diagnosed based on a physical exam and mental evaluation.  In some cases, your health care provider may use an anxiety or depression screening tool. This includes a list of questions that can help a health care provider diagnose PMAD.  You may be referred to a mental health expert who specializes in treating PMAD. How is this treated? This condition may be treated with:  Talk therapy with a mental health professional. This may be family therapy, marriage therapy, cognitive behavioral therapy, or interpersonal therapy.  Medicines. Your health care provider will discuss medicines that are safe to use during pregnancy and breastfeeding.  Stress reduction therapies, such as mindfulness, deep breathing, or guided muscle relaxation.  Support groups, early childhood education, or other groups to help with being a parent.   Follow these instructions at home: Lifestyle  Do not use any products that contain nicotine or tobacco. These products include cigarettes, chewing tobacco, and vaping devices, such as e-cigarettes. If you need help quitting, ask your health care provider.    Do not drink alcohol when you are pregnant. It is also safest not to drink alcohol if you are breastfeeding. ? After your baby is born, if  you drink alcohol:  Limit how much you have to 0-1 drink a day.  Be aware of how much alcohol is in your drink. In the [place] one drink equals one 12 oz bottle of beer (355 mL), one 5 oz glass of wine (148 mL), or one 1 oz glass of hard liquor (44 mL).  Consider joining a support group for new mothers. Ask your health care provider for recommendations.  Take good care of yourself. Make sure you: ? Get as much rest as possible. Talk with your partner about sharing the responsibility of getting up with your baby if possible. Make sleep a priority. ? Eat a healthy diet. This includes plenty of fruits and vegetables, whole grains, and lean proteins. ? Exercise regularly, as told by your health care provider. Ask your health care provider what exercises are safe for you. Talk with your partner about making sure you both have opportunities to exercise. General instructions  Take over-the-counter and prescription medicines only as told by your health care provider.  Talk with your partner or family members about your feelings during pregnancy. Share your concerns, needs, or anxieties with each other. Do not be afraid to ask for help. Find a mental health professional, if needed.  Ask for help with tasks or chores when you need it. Ask friends and family members to provide meals, watch your children, or help with cleaning. If friends or family are not able to help, consider finding a licensed child care provider or professional house cleaner if needed. Let your partner know what you need. He or she may be struggling too.  Keep all follow-up visits. This is important. Contact a health care provider if:  You or people close to you notice that you have symptoms of anxiety or depression.  Your symptoms of anxiety or depression get worse.  You take medicines and have side effects that are uncomfortable or difficult to tolerate. Get help right away if:  You feel like hurting yourself, your baby, or  someone else. If you feel like you may hurt yourself or others, or have thoughts about taking your own life, get help right away. Go to your nearest emergency department or:  Call your local emergency services (911 in the [place]  Call a suicide crisis helpline, such as the National Suicide Prevention Lifeline at 1-800-273-8255. This is open 24 hours a day in the [place]  Text the Crisis Text Line at 741741 (in the [place] Summary  Perinatal mood and anxiety disorder (PMAD) is when a woman or her partner feels excessive sadness, anger, or worry and tension (anxiety) during pregnancy or during the first few months after the birth.  PMAD may include depression, intense mood swings, panic attacks, separation anxiety, phobias, or generalized anxiety.  PMAD can cause problems for the mother, the baby, or the father if not properly managed.  This condition is treated with medicines, talk therapy, stress reduction therapies, or a combination of treatments.  Talk with your partner or family members about your concerns or fears. Ask for help when you need it. This information is not intended to replace advice given to you by your health care provider. Make sure you discuss any questions you have with your health care provider. Document Revised: 07/20/2019 Document Reviewed: 07/20/2019 Elsevier Patient Education  2021 Elsevier Inc.  

## 2020-10-02 ENCOUNTER — Ambulatory Visit (INDEPENDENT_AMBULATORY_CARE_PROVIDER_SITE_OTHER): Payer: Medicaid Other | Admitting: Obstetrics and Gynecology

## 2020-10-02 ENCOUNTER — Other Ambulatory Visit (HOSPITAL_COMMUNITY)
Admission: RE | Admit: 2020-10-02 | Discharge: 2020-10-02 | Disposition: A | Discharge status: Home / Self Care | Payer: Medicaid Other | Source: Ambulatory Visit | Attending: Obstetrics and Gynecology | Admitting: Obstetrics and Gynecology

## 2020-10-02 ENCOUNTER — Other Ambulatory Visit: Payer: Self-pay

## 2020-10-02 ENCOUNTER — Encounter: Payer: Self-pay | Admitting: Obstetrics and Gynecology

## 2020-10-02 VITALS — BP 130/74 | Ht 68.0 in | Wt 240.6 lb

## 2020-10-02 DIAGNOSIS — Z01419 Encounter for gynecological examination (general) (routine) without abnormal findings: Secondary | ICD-10-CM

## 2020-10-02 DIAGNOSIS — Z124 Encounter for screening for malignant neoplasm of cervix: Secondary | ICD-10-CM

## 2020-10-02 DIAGNOSIS — Z3041 Encounter for surveillance of contraceptive pills: Secondary | ICD-10-CM

## 2020-10-02 DIAGNOSIS — Z8639 Personal history of other endocrine, nutritional and metabolic disease: Secondary | ICD-10-CM

## 2020-10-02 DIAGNOSIS — Z0000 Encounter for general adult medical examination without abnormal findings: Secondary | ICD-10-CM

## 2020-10-02 DIAGNOSIS — Z1239 Encounter for other screening for malignant neoplasm of breast: Secondary | ICD-10-CM

## 2020-10-02 MED ORDER — DESOGESTREL-ETHINYL ESTRADIOL 0.15-30 MG-MCG PO TABS
1.0000 | ORAL_TABLET | Freq: Every day | ORAL | 11 refills | Status: DC
Start: 2020-10-02 — End: 2021-04-15

## 2020-10-02 NOTE — Patient Instructions (Signed)
Institute of Medicine Recommended Dietary Allowances for Calcium and Vitamin D  Age (yr) Calcium Recommended Dietary Allowance (mg/day) Vitamin D Recommended Dietary Allowance (international units/day)  9-18 1,300 600  19-50 1,000 600  51-70 1,200 600  71 and older 1,200 800  Data from Institute of Medicine. Dietary reference intakes: calcium, vitamin D. [place], [place]: National Academies Press; 2011.   Exercising to Stay Healthy To become healthy and stay healthy, it is recommended that you do moderate-intensity and vigorous-intensity exercise. You can tell that you are exercising at a moderate intensity if your heart starts beating faster and you start breathing faster but can still hold a conversation. You can tell that you are exercising at a vigorous intensity if you are breathing much harder andfaster and cannot hold a conversation while exercising. Exercising regularly is important. It has many health benefits, such as: Improving overall fitness, flexibility, and endurance. Increasing bone density. Helping with weight control. Decreasing body fat. Increasing muscle strength. Reducing stress and tension. Improving overall health. How often should I exercise? Choose an activity that you enjoy, and set realistic goals. Your health careprovider can help you make an activity plan that works for you. Exercise regularly as told by your health care provider. This may include: Doing strength training two times a week, such as: Lifting weights. Using resistance bands. Push-ups. Sit-ups. Yoga. Doing a certain intensity of exercise for a given amount of time. Choose from these options: A total of 150 minutes of moderate-intensity exercise every week. A total of 75 minutes of vigorous-intensity exercise every week. A mix of moderate-intensity and vigorous-intensity exercise every week. Children, pregnant women, people who have not exercised regularly, people who are overweight, and  older adults may need to talk with a health care provider about what activities are safe to do. If you have a medical condition, be sureto talk with your health care provider before you start a new exercise program. What are some exercise ideas? Moderate-intensity exercise ideas include: Walking 1 mile (1.6 km) in about 15 minutes. Biking. Hiking. Golfing. Dancing. Water aerobics. Vigorous-intensity exercise ideas include: Walking 4.5 miles (7.2 km) or more in about 1 hour. Jogging or running 5 miles (8 km) in about 1 hour. Biking 10 miles (16.1 km) or more in about 1 hour. Lap swimming. Roller-skating or in-line skating. Cross-country skiing. Vigorous competitive sports, such as football, basketball, and soccer. Jumping rope. Aerobic dancing. What are some everyday activities that can help me to get exercise? Yard work, such as: Pushing a lawn mower. Raking and bagging leaves. Washing your car. Pushing a stroller. Shoveling snow. Gardening. Washing windows or floors. How can I be more active in my day-to-day activities? Use stairs instead of an elevator. Take a walk during your lunch break. If you drive, park your car farther away from your work or school. If you take public transportation, get off one stop early and walk the rest of the way. Stand up or walk around during all of your indoor phone calls. Get up, stretch, and walk around every 30 minutes throughout the day. Enjoy exercise with a friend. Support to continue exercising will help you keep a regular routine of activity. What guidelines can I follow while exercising? Before you start a new exercise program, talk with your health care provider. Do not exercise so much that you hurt yourself, feel dizzy, or get very short of breath. Wear comfortable clothes and wear shoes with good support. Drink plenty of water while you exercise to prevent   dehydration or heat stroke. Work out until your breathing and your  heartbeat get faster. Where to find more information [place] Department of Health and Human Services: www.hhs.gov Centers for Disease Control and Prevention (CDC): www.cdc.gov Summary Exercising regularly is important. It will improve your overall fitness, flexibility, and endurance. Regular exercise also will improve your overall health. It can help you control your weight, reduce stress, and improve your bone density. Do not exercise so much that you hurt yourself, feel dizzy, or get very short of breath. Before you start a new exercise program, talk with your health care provider. This information is not intended to replace advice given to you by your health care provider. Make sure you discuss any questions you have with your healthcare provider. Document Revised: 01/11/2020 Document Reviewed: 01/11/2020 Elsevier Patient Education  2022 Elsevier Inc. Budget-Friendly Healthy Eating There are many ways to save money at the grocery store and continue to eat healthy. You can be successful if you: Plan meals according to your budget. Make a grocery list and only purchase food according to your grocery list. Prepare food yourself at home. What are tips for following this plan? Reading food labels Compare food labels between brand name foods and the store brand. Often the nutritional value is the same, but the store brand is lower cost. Look for products that do not have added sugar, fat, or salt (sodium). These often cost the same but are healthier for you. Products may be labeled as: Sugar-free. Nonfat. Low-fat. Sodium-free. Low-sodium. Look for lean ground beef labeled as at least 92% lean and 8% fat. Shopping  Buy only the items on your grocery list and go only to the areas of the store that have the items on your list. Use coupons only for foods and brands you normally buy. Avoid buying items you wouldn't normally buy simply because they are on sale. Check online and in newspapers for  weekly deals. Buy healthy items from the bulk bins when available, such as herbs, spices, flour, pasta, nuts, and dried fruit. Buy fruits and vegetables that are in season. Prices are usually lower on in-season produce. Look at the unit price on the price tag. Use it to compare different brands and sizes to find out which item is the best deal. Choose healthy items that are often low-cost, such as carrots, potatoes, apples, bananas, and oranges. Dried or canned beans are a low-cost protein source. Buy in bulk and freeze extra food. Items you can buy in bulk include meats, fish, poultry, frozen fruits, and frozen vegetables. Avoid buying "ready-to-eat" foods, such as pre-cut fruits and vegetables and pre-made salads. If possible, shop around to discover where you can find the best prices. Consider other retailers such as dollar stores, larger wholesale stores, local fruit and vegetable stands, and farmers markets. Do not shop when you are hungry. If you shop while hungry, it may be hard to stick to your list and budget. Resist impulse buying. Use your grocery list as your official plan for the week. Buy a variety of vegetables and fruits by purchasing fresh, frozen, and canned items. Look at the top and bottom shelves for deals. Foods at eye level (eye level of an adult or child) are usually more expensive. Be efficient with your time when shopping. The more time you spend at the store, the more money you are likely to spend. To save money when choosing more expensive foods like meats and dairy: Choose cheaper cuts of meat, such as bone-in   chicken thighs and drumsticks instead of skinless and boneless chicken. When you are ready to prepare the chicken, you can remove the skin yourself to make it healthier. Choose lean meats like chicken or turkey instead of beef. Choose canned seafood, such as tuna, salmon, or sardines. Buy eggs as a low-cost source of protein. Buy dried beans and peas, such as  lentils, split peas, or kidney beans instead of meats. Dried beans and peas are a good alternative source of protein. Buy the larger tubs of yogurt instead of individual-sized containers. Choose water instead of sodas and other sweetened beverages. Avoid buying chips, cookies, and other "junk food." These items are usually expensive and not healthy.  Cooking Make extra food and freeze the extras in meal-sized containers or in individual portions for fast meals and snacks. Pre-cook on days when you have extra time to prepare meals in advance. You can keep these meals in the fridge or freezer and reheat for a quick meal. When you come home from the grocery store, wash, peel, and cut fruits and vegetables so they are ready to use and eat. This will help reduce food waste. Meal planning Do not eat out or get fast food. Prepare food at home. Make a grocery list and make sure to bring it with you to the store. If you have a smart phone, you could use your phone to create your shopping list. Plan meals and snacks according to a grocery list and budget you create. Use leftovers in your meal plan for the week. Look for recipes where you can cook once and make enough food for two meals. Prepare budget-friendly types of meals like stews, casseroles, and stir-fry dishes. Try some meatless meals or try "no cook" meals like salads. Make sure that half your plate is filled with fruits or vegetables. Choose from fresh, frozen, or canned fruits and vegetables. If eating canned, remember to rinse them before eating. This will remove any excess salt added for packaging. Summary Eating healthy on a budget is possible if you plan your meals according to your budget, purchase according to your budget and grocery list, and prepare food yourself. Tips for buying more food on a limited budget include buying generic brands, using coupons only for foods you normally buy, and buying healthy items from the bulk bins when  available. Tips for buying cheaper food to replace expensive food include choosing cheaper, lean cuts of meat, and buying dried beans and peas. This information is not intended to replace advice given to you by your health care provider. Make sure you discuss any questions you have with your healthcare provider. Document Revised: 11/08/2019 Document Reviewed: 11/08/2019 Elsevier Patient Education  2022 Else[employer] Inc. Bone Health Bones protect organs, store calcium, anchor muscles, and support the whole body. Keeping your bones strong is important, especially as you get older. Youcan take actions to help keep your bones strong and healthy. Why is keeping my bones healthy important?  Keeping your bones healthy is important because your body constantly replaces bone cells. Cells get old, and new cells take their place. As we age, we lose bone cells because the body may not be able to make enough new cells to replace the old cells. The amount of bone cells and bone tissue you have is referred toas bone mass. The higher your bone mass, the stronger your bones. The aging process leads to an overall loss of bone mass in the body, which can increase the likelihood of: Joint pain   and stiffness. Broken bones. A condition in which the bones become weak and brittle (osteoporosis). A large decline in bone mass occurs in older adults. In women, it occurs aboutthe time of menopause. What actions can I take to keep my bones healthy? Good health habits are important for maintaining healthy bones. This includes eating nutritious foods and exercising regularly. To have healthy bones, you need to get enough of the right minerals and vitamins. Most nutrition experts recommend getting these nutrients from the foods that you eat. In some cases, taking supplements may also be recommended. Doing certain types of exercise isalso important for bone health. What are the nutritional recommendations for healthy bones?  Eating  a well-balanced diet with plenty of calcium and vitamin D will help to protect your bones. Nutritional recommendations vary from person to person. Ask your health care provider what is healthy for you. Here are some generalguidelines. Get enough calcium Calcium is the most important (essential) mineral for bone health. Most people can get enough calcium from their diet, but supplements may be recommended for people who are at risk for osteoporosis. Good sources of calcium include: Dairy products, such as low-fat or nonfat milk, cheese, and yogurt. Dark green leafy vegetables, such as bok choy and broccoli. Calcium-fortified foods, such as orange juice, cereal, bread, soy beverages, and tofu products. Nuts, such as almonds. Follow these recommended amounts for daily calcium intake: Children, age 1-3: 700 mg. Children, age 4-8: 1,000 mg. Children, age 9-13: 1,300 mg. Teens, age 14-18: 1,300 mg. Adults, age 19-50: 1,000 mg. Adults, age 51-70: Men: 1,000 mg. Women: 1,200 mg. Adults, age 71 or older: 1,200 mg. Pregnant and breastfeeding females: Teens: 1,300 mg. Adults: 1,000 mg. Get enough vitamin D Vitamin D is the most essential vitamin for bone health. It helps the body absorb calcium. Sunlight stimulates the skin to make vitamin D, so be sure to get enough sunlight. If you live in a cold climate or you do not get outside often, your health care provider may recommend that you take vitamin D supplements. Good sources of vitamin D in your diet include: Egg yolks. Saltwater fish. Milk and cereal fortified with vitamin D. Follow these recommended amounts for daily vitamin D intake: Children and teens, age 1-18: 600 international units. Adults, age 50 or younger: 400-800 international units. Adults, age 51 or older: 800-1,000 international units. Get other important nutrients Other nutrients that are important for bone health include: Phosphorus. This mineral is found in meat, poultry,  dairy foods, nuts, and legumes. The recommended daily intake for adult men and adult women is 700 mg. Magnesium. This mineral is found in seeds, nuts, dark green vegetables, and legumes. The recommended daily intake for adult men is 400-420 mg. For adult women, it is 310-320 mg. Vitamin K. This vitamin is found in green leafy vegetables. The recommended daily intake is 120 mg for adult men and 90 mg for adult women. What type of physical activity is best for building and maintaining healthybones? Weight-bearing and strength-building activities are important for building and maintaining healthy bones. Weight-bearing activities cause muscles and bones to work against gravity. Strength-building activities increase the strength of the muscles that support bones. Weight-bearing and muscle-building activities include: Walking and hiking. Jogging and running. Dancing. Gym exercises. Lifting weights. Tennis and racquetball. Climbing stairs. Aerobics. Adults should get at least 30 minutes of moderate physical activity on most days. Children should get at least 60 minutes of moderate physical activity onmost days. Ask your health care   provider what type of exercise is best for you. How can I find out if my bone mass is low? Bone mass can be measured with an X-ray test called a bone mineral density (BMD) test. This test is recommended for all women who are age 65 or older. It may also be recommended for: Men who are age 70 or older. People who are at risk for osteoporosis because of: Having bones that break easily. Having a long-term disease that weakens bones, such as kidney disease or rheumatoid arthritis. Having menopause earlier than normal. Taking medicine that weakens bones, such as steroids, thyroid hormones, or hormone treatment for breast cancer or prostate cancer. Smoking. Drinking three or more alcoholic drinks a day. If you find that you have a low bone mass, you may be able to  preventosteoporosis or further bone loss by changing your diet and lifestyle. Where can I find more information? For more information, check out the following websites: National Osteoporosis Foundation: www.nof.org/patients National Institutes of Health: www.bones.nih.gov International Osteoporosis Foundation: www.iofbonehealth.org Summary The aging process leads to an overall loss of bone mass in the body, which can increase the likelihood of broken bones and osteoporosis. Eating a well-balanced diet with plenty of calcium and vitamin D will help to protect your bones. Weight-bearing and strength-building activities are also important for building and maintaining strong bones. Bone mass can be measured with an X-ray test called a bone mineral density (BMD) test. This information is not intended to replace advice given to you by your health care provider. Make sure you discuss any questions you have with your healthcare provider. Document Revised: 02/21/2017 Document Reviewed: 02/21/2017 Elsevier Patient Education  2022 [employer] Inc.  

## 2020-10-02 NOTE — Progress Notes (Signed)
Gynecology Annual Exam  PCP: Margaretann Loveless, PA-C  Chief Complaint:  Chief Complaint  Patient presents with   Gynecologic Exam    History of Present Illness: Patient is a 30 y.o. [id number] presents for annual exam. The patient has no complaints today.   LMP: Patient's last menstrual period was 09/24/2020. Average Interval: irregular Duration of flow: 3 days Heavy Menses: no Dysmenorrhea: no  She denies passage of large clots She denies sensations of gushing or flooding of blood. She denies accidents where she bleeds through her clothing. She denies that she changes a saturated pad or tampon more frequently than every hour.  She denies that pain from her periods limits her activities.  The patient does perform self breast exams.  There is notable family history of breast or ovarian cancer in her family.  The patient reports her exercise generally consists of chasing after her children .  The patient denies current symptoms of depression.   PHQ-9: 2 GAD-7: 1   Review of Systems: Review of Systems  Constitutional:  Negative for chills, fever, malaise/fatigue and weight loss.  HENT:  Negative for congestion, hearing loss and sinus pain.   Eyes:  Negative for blurred vision and double vision.  Respiratory:  Negative for cough, sputum production, shortness of breath and wheezing.   Cardiovascular:  Negative for chest pain, palpitations, orthopnea and leg swelling.  Gastrointestinal:  Negative for abdominal pain, constipation, diarrhea, nausea and vomiting.  Genitourinary:  Negative for dysuria, flank pain, frequency, hematuria and urgency.  Musculoskeletal:  Negative for back pain, falls and joint pain.  Skin:  Negative for itching and rash.  Neurological:  Negative for dizziness and headaches.  Psychiatric/Behavioral:  Negative for depression, substance abuse and suicidal ideas. The patient is not nervous/anxious.    Past Medical History:  Past Medical History:   Diagnosis Date   Allergy    Hyperthyroidism 08/07/2014    Past Surgical History:  Past Surgical History:  Procedure Laterality Date   KNEE SURGERY Right 2006   TONSILLECTOMY     At age 2    Gynecologic History:  Patient's last menstrual period was 09/24/2020. Menarche: 14  History of fibroids, polyps, or ovarian cysts? : no  History of PCOS? no Hstory of Endometriosis? no History of abnormal pap smears? no Have you had any sexually transmitted infections in the past? no  She is uncertain about HPV vaccination in the past, but believes she has had it. Last Pap: Results were: 2019  She identifies as a female. She is sexually active with men.   She denies dyspareunia. She denies postcoital bleeding.  She currently uses OCP (estrogen/progesterone) for contraception.    Obstetric History: G2P2002  Family History:  Family History  Problem Relation Age of Onset   Ovarian cysts Mother        removed cyst on one side in 2018   Thyroid cancer Maternal Aunt    Breast cancer Paternal Aunt        ? age   Breast cancer Maternal Grandmother     Social History:  Social History   Socioeconomic History   Marital status: Married    Spouse name: Josh   Number of children: Not on file   Years of education: Not on file   Highest education level: Not on file  Occupational History   Not on file  Tobacco Use   Smoking status: Never   Smokeless tobacco: Never  Vaping Use   Vaping Use: Some days  Substance and Sexual Activity   Alcohol use: No    Alcohol/week: 0.0 standard drinks   Drug use: No   Sexual activity: Yes    Birth control/protection: Other-see comments    Comment: undecided   Other Topics Concern   Not on file  Social History Narrative   Not on file   Social Determinants of Health   Financial Resource Strain: Not on file  Food Insecurity: Not on file  Transportation Needs: Not on file  Physical Activity: Not on file  Stress: Not on file  Social  Connections: Not on file  Intimate Partner Violence: Not on file    Allergies:  Allergies  Allergen Reactions   Penicillins Hives    Medications: Prior to Admission medications   Medication Sig Start Date End Date Taking? Authorizing Provider  desogestrel-ethinyl estradiol (APRI) 0.15-30 MG-MCG tablet Take 1 tablet by mouth daily. 04/04/20  Yes Aarti Mankowski, Jaquelyn Bitter, MD  desogestrel-ethinyl estradiol (APRI) 0.15-30 MG-MCG tablet Take 1 tablet by mouth daily. 10/02/20  Yes Aadam Zhen, Jaquelyn Bitter, MD  Prenatal Vit-Fe Fumarate-FA (MULTIVITAMIN-PRENATAL) 27-0.8 MG TABS tablet Take 1 tablet by mouth daily at 12 noon. Patient not taking: No sig reported    [provider]  fluticasone (FLONASE) 50 MCG/ACT nasal spray Place 1 spray into both nostrils daily. 08/13/19 08/28/19  Merrilee Jansky, MD    Physical Exam Vitals: Blood pressure 130/74, height 5\' 8"  (1.727 m), weight 240 lb 9.6 oz (109.1 kg), last menstrual period 09/24/2020, not currently breastfeeding.  Physical Exam Constitutional:      Appearance: She is well-developed.  Genitourinary:     Genitourinary Comments: External: Normal appearing vulva. No lesions noted.  Speculum examination: Normal appearing cervix. No blood in the vaginal vault. No discharge.    Bimanual examination: Uterus midline, non-tender, normal in size, shape and contour.  No CMT. No adnexal masses. No adnexal tenderness. Pelvis not fixed.  Breast Exam: breast equal without skin changes, nipple discharge, breast lump or enlarged lymph nodes   HENT:     Head: Normocephalic and atraumatic.  Neck:     Thyroid: No thyromegaly.  Cardiovascular:     Rate and Rhythm: Normal rate and regular rhythm.     Heart sounds: Normal heart sounds.  Pulmonary:     Effort: Pulmonary effort is normal.     Breath sounds: Normal breath sounds.  Abdominal:     General: Bowel sounds are normal. There is no distension.     Palpations: Abdomen is soft. There is no mass.   Musculoskeletal:     Cervical back: Neck supple.  Neurological:     Mental Status: She is alert and oriented to person, place, and time.  Skin:    General: Skin is warm and dry.  Psychiatric:        Behavior: Behavior normal.        Thought Content: Thought content normal.        Judgment: Judgment normal.  Vitals reviewed.   Female chaperone present for pelvic and breast  portions of the physical exam  Assessment: 30 y.o. 26 routine annual exam  Plan: Problem List Items Addressed This Visit       Other   H/O Graves' disease - Primary   Relevant Orders   TSH + free T4   T3, free   Other Visit Diagnoses     Encounter for birth control pills maintenance       Relevant Medications   desogestrel-ethinyl estradiol (APRI) 0.15-30 MG-MCG tablet  1) STI screening was offered and declined  2) ASCCP guidelines and rational discussed.  Pap smear today.  3) Contraception - continue OCP, rx sent  4) Routine healthcare maintenance including cholesterol, diabetes screening discussed  . TSH labs today.   Adelene Idler MD, [facility], [facility] 10/02/2020 11:34 AM

## 2020-10-03 LAB — T3, FREE: T3, Free: 2.7 pg/mL (ref 2.0–4.4)

## 2020-10-03 LAB — TSH+FREE T4
Free T4: 0.78 ng/dL — ABNORMAL LOW (ref 0.82–1.77)
TSH: 7.72 u[IU]/mL — ABNORMAL HIGH (ref 0.450–4.500)

## 2020-10-09 LAB — CYTOLOGY - PAP
Comment: NEGATIVE
Comment: NEGATIVE
Diagnosis: NEGATIVE
HPV 16: NEGATIVE
HPV 18 / 45: POSITIVE — AB
High risk HPV: POSITIVE — AB

## 2020-10-10 ENCOUNTER — Other Ambulatory Visit: Payer: Self-pay | Admitting: Obstetrics and Gynecology

## 2020-10-10 ENCOUNTER — Telehealth: Payer: Self-pay

## 2020-10-10 DIAGNOSIS — Z8639 Personal history of other endocrine, nutritional and metabolic disease: Secondary | ICD-10-CM

## 2020-10-10 DIAGNOSIS — R7989 Other specified abnormal findings of blood chemistry: Secondary | ICD-10-CM

## 2020-10-10 NOTE — Telephone Encounter (Signed)
Called and discussed with patient please call her to schedule a colposcopy

## 2020-10-10 NOTE — Telephone Encounter (Signed)
Pt calling needing [facility] to go over Pap results. If you want me to call her just let me know what to tell her.

## 2020-10-10 NOTE — Progress Notes (Signed)
Referral for abnormal TSH in setting of prior graves disease

## 2020-10-15 NOTE — Telephone Encounter (Signed)
Contacted patient via phone. Patient requested Wedneday's and Thursday's. Patient is scheduled for 11/26/20 with CRS

## 2020-11-26 ENCOUNTER — Encounter: Payer: Self-pay | Admitting: Obstetrics and Gynecology

## 2020-11-26 ENCOUNTER — Other Ambulatory Visit (HOSPITAL_COMMUNITY)
Admission: RE | Admit: 2020-11-26 | Discharge: 2020-11-26 | Disposition: A | Discharge status: Home / Self Care | Payer: Medicaid Other | Source: Ambulatory Visit | Attending: Obstetrics and Gynecology | Admitting: Obstetrics and Gynecology

## 2020-11-26 ENCOUNTER — Ambulatory Visit (INDEPENDENT_AMBULATORY_CARE_PROVIDER_SITE_OTHER): Payer: Medicaid Other | Admitting: Obstetrics and Gynecology

## 2020-11-26 ENCOUNTER — Other Ambulatory Visit: Payer: Self-pay

## 2020-11-26 VITALS — BP 132/76 | Ht 68.0 in | Wt 241.0 lb

## 2020-11-26 DIAGNOSIS — R87810 Cervical high risk human papillomavirus (HPV) DNA test positive: Secondary | ICD-10-CM

## 2020-11-26 NOTE — Progress Notes (Signed)
   GYNECOLOGY CLINIC COLPOSCOPY PROCEDURE NOTE  30 y.o. O0H2122 here for colposcopy for NIL and HR HPV+  pap smear on 10/02/2020. Discussed underlying role for HPV infection in the development of cervical dysplasia, its natural history and progression/regression, need for surveillance.  Is the patient  pregnant: No LMP: Patient's last menstrual period was 10/23/2020. Smoking status:  reports that she has never smoked. She has never used smokeless tobacco. Contraception: condoms Future fertility desired:  Yes  Patient given informed consent, signed copy in the chart, time out was performed.  The patient was position in dorsal lithotomy position. Speculum was placed the cervix was visualized.   After application of acetic acid colposcopic inspection of the cervix was undertaken.   Colposcopy adequate, full visualization of transformation zone: Yes acetowhite lesion(s) noted at 11 and 6 o'clock; corresponding biopsies obtained.   ECC specimen obtained:  Yes  All specimens were labeled and sent to pathology.   Patient was given post procedure instructions.  Will follow up pathology and manage accordingly.  Routine preventative health maintenance measures emphasized.  Physical Exam Genitourinary:       Adelene Idler MD [facility], [facility] 11/26/2020 10:21 AM

## 2020-11-26 NOTE — Patient Instructions (Signed)
Colposcopy, Care After This sheet gives you information about how to care for yourself after your procedure. Your doctor may also give you more specific instructions. If youhave problems or questions, contact your doctor. What can I expect after the procedure? If you did not have a sample of your tissue taken out (did not have a biopsy), you may only have some spotting of blood for a few days. You can go back toyour normal activities. If you had a sample of your tissue taken out, it is common to have: Soreness and mild pain. These may last for a few days. A light-headed feeling. Mild bleeding or fluid (discharge) coming from your vagina. The fluid will look dark and grainy. You may have this for a few days. The fluid may be caused by a liquid that was used during your procedure. You may need to wear a sanitary pad. Spotting of blood for at least 48 hours after the procedure. Follow these instructions at home: Medicines Take over-the-counter and prescription medicines only as told by your doctor. Ask your doctor what medicines you can start taking again. This is very important if you take blood thinners. Activity Limit your activity for the first day after your procedure as told by your doctor. For at least 3 days, or for as long as told by your doctor, avoid: Douching. Using tampons. Having sex. Return to your normal activities as told by your doctor. Ask your doctor what activities are safe for you. General instructions  Drink enough fluid to keep your pee (urine) pale yellow. Ask your doctor if you may take baths, swim, or use a hot tub. You may take showers. If you use birth control (contraception), keep using it. Keep all follow-up visits as told by your doctor. This is important.  Contact a doctor if: You get a skin rash. Get help right away if: You bleed a lot from your vagina. A lot of bleeding means you use more than one pad an hour for 2 hours in a row. You have clumps of  blood (blood clots) coming from your vagina. You have a fever or chills. You have signs of infection. This may be fluid coming from your vagina that is: Different than normal. Yellow. Bad-smelling. You have very bad pain or cramps in your lower belly that do not get better with medicine. You faint. Summary If you did not have a sample of your tissue taken out, you may only have some spotting of blood for a few days. You can go back to your normal activities. If you had a sample of your tissue taken out, it is common to have mild pain for a few days and spotting for 48 hours. Avoid douching, using tampons, and having sex for at least 3 days after the procedure or for as long as told. Get help right away if you have a lot of bleeding, very bad pain, or signs of infection. This information is not intended to replace advice given to you by your health care provider. Make sure you discuss any questions you have with your healthcare provider. Document Revised: 11/27/2019 Document Reviewed: 01/24/2019 Elsevier Patient Education  2022 Elsevier Inc.  

## 2020-11-28 LAB — SURGICAL PATHOLOGY

## 2021-02-08 NOTE — L&D Delivery Note (Signed)
      Delivery Note   Brenda Villegas is a 31 y.o. G3P3003 at [redacted]w[redacted]d Estimated Date of Delivery: 12/04/21  PRE-OPERATIVE DIAGNOSIS:  1) [redacted]w[redacted]d pregnancy.  2) Hashimoto's thyroiditis 3) H/O CIN 2 4) Active labor  POST-OPERATIVE DIAGNOSIS:  1) [redacted]w[redacted]d pregnancy s/p Vaginal, Spontaneous  2) Viable female infant  Delivery Type: Vaginal, Spontaneous    Delivery Anesthesia: None   Labor Complications:      ESTIMATED BLOOD LOSS: 75  ml    FINDINGS:   1) female infant, Apgar scores of 8   at 1 minute and 9   at 5 minutes and a birthweight of 124  ounces.    2) Nuchal cord: No  SPECIMENS:   PLACENTA:   Appearance: Intact    Removal: Spontaneous      Disposition:    DISPOSITION:  Infant to left in stable condition in the delivery room, with L&D personnel and mother,  NARRATIVE SUMMARY: Labor course:  [name] is a [id number] at [redacted]w[redacted]d who presented for labor management.  She progressed rapidly in labor and became complete approximately 1 hour after admission.  She evidenced good maternal expulsive effort during the second stage. She went on to deliver a viable infant. The placenta delivered without problems and was noted to be complete. A perineal and vaginal examination was performed. Episiotomy/Lacerations: None    Finis Bud, M.D. 11/30/2021 11:57 PM

## 2021-03-18 ENCOUNTER — Telehealth: Payer: Self-pay

## 2021-03-18 NOTE — Telephone Encounter (Signed)
-----   Message from Tresa Moore sent at 03/16/2021 11:03 AM EST ----- Hey this was sent to [place] in error. CRS requesting to be scheduled

## 2021-03-18 NOTE — Telephone Encounter (Signed)
Scheduled patient for in office LEEP with Schuman on 04/17/21 @ 9:35.

## 2021-03-23 NOTE — Telephone Encounter (Signed)
Spoke w/patient. Advised LEEP will not be performed during pregnancy as it may compromise the pregnancy causing incompetent cervix. Advised will keep LEEP appointment until pregnancy is confirmed. Transferred to front desk for NOB scheduling.

## 2021-03-23 NOTE — Telephone Encounter (Signed)
Patient reports she is late for period and had a +hpt. Inquiring about cancelling procedure or if needs apt to confirm pregnancy before cancelling.

## 2021-03-24 NOTE — Telephone Encounter (Signed)
That would be good- thank you

## 2021-03-24 NOTE — Telephone Encounter (Signed)
Patient aware. Last Colposcopy 11/2020. 04/17/21 apt for LEEP still on schedule (can use for Colpo if timing appropriate). Will cancel and r/s if needed per Dr. Ellie Lunch instructions.

## 2021-03-24 NOTE — Telephone Encounter (Signed)
Will need colposcopy monitoring every 12 weeks of cervix during pregnancy

## 2021-04-15 ENCOUNTER — Ambulatory Visit (INDEPENDENT_AMBULATORY_CARE_PROVIDER_SITE_OTHER): Payer: Medicaid Other | Admitting: Obstetrics

## 2021-04-15 ENCOUNTER — Other Ambulatory Visit (HOSPITAL_COMMUNITY)
Admission: RE | Admit: 2021-04-15 | Discharge: 2021-04-15 | Disposition: A | Discharge status: Home / Self Care | Payer: Medicaid Other | Source: Ambulatory Visit | Attending: Obstetrics | Admitting: Obstetrics

## 2021-04-15 ENCOUNTER — Other Ambulatory Visit: Payer: Self-pay

## 2021-04-15 ENCOUNTER — Encounter: Payer: Self-pay | Admitting: Obstetrics

## 2021-04-15 VITALS — BP 112/70 | Wt 248.8 lb

## 2021-04-15 DIAGNOSIS — Z124 Encounter for screening for malignant neoplasm of cervix: Secondary | ICD-10-CM | POA: Insufficient documentation

## 2021-04-15 DIAGNOSIS — Z113 Encounter for screening for infections with a predominantly sexual mode of transmission: Secondary | ICD-10-CM

## 2021-04-15 DIAGNOSIS — Z3480 Encounter for supervision of other normal pregnancy, unspecified trimester: Secondary | ICD-10-CM | POA: Insufficient documentation

## 2021-04-15 DIAGNOSIS — O0990 Supervision of high risk pregnancy, unspecified, unspecified trimester: Secondary | ICD-10-CM | POA: Diagnosis not present

## 2021-04-15 DIAGNOSIS — Z3A01 Less than 8 weeks gestation of pregnancy: Secondary | ICD-10-CM

## 2021-04-15 DIAGNOSIS — Z8639 Personal history of other endocrine, nutritional and metabolic disease: Secondary | ICD-10-CM

## 2021-04-15 NOTE — Progress Notes (Signed)
? ? ? ? ? ?New Obstetric Patient H&P  ? ? ?Chief Complaint: "Desires prenatal care" ? ? ?History of Present Illness: Patient is a 31 y.o. [id number] Not Hispanic or Latino female, LMP 02/27/2021 presents with amenorrhea and positive home pregnancy test. Based on her  LMP, her EDD is Estimated Date of Delivery: 12/04/21 and her EGA is [redacted]w[redacted]d. Cycles are  regular, and occur approximately every : 28 days. Her last pap smear was 10/02/2020 and was NILM, POSITIVE for high-risk HPV with a coloposcopy completed 11/26/2020, with results of CIN2,3. Had been schedule for a LEEP procedure before becoming pregnant.  ?  ?She had a urine pregnancy test which was positive. Her last menstrual period was normal. Since her LMP she claims she has experienced fatigue, nausea, cramping, and breast tenderness. She denies vaginal bleeding. Her past medical history is noncontributory. Her prior pregnancies are notable for none ?  ?Since her LMP, she admits to the use of tobacco products  yes ?She claims she has gained   7 pounds since the start of her pregnancy.  ?There are cats in the home in the home  no ?She admits close contact with children on a regular basis yes ?She has had chicken pox in the past yes ?She has had Tuberculosis exposures, symptoms, or previously tested positive for TB   no ?Current or past history of domestic violence. no ? ?Genetic Screening/Teratology Counseling: (Includes patient, baby's father, or anyone in either family with:)  ? ?1. Patient's age >/= 24 [facility]  no ?2. Thalassemia ([place], [place], [place], or Asian background): MCV<80  no ?3. Neural tube defect (meningomyelocele, spina bifida, anencephaly)  no ?4. Congenital heart defect  no  ?5. Down syndrome  no ?6. Tay-Sachs (Jewish, [place])  no ?7. Canavan's Disease  no ?8. Sickle cell disease or trait (African)  no  ?9. Hemophilia or other blood disorders  no  ?10. Muscular dystrophy  no  ?11. Cystic fibrosis  no  ?12. Huntington's Chorea  no  ?13.  Mental retardation/autism  no ?14. Other inherited genetic or chromosomal disorder  no ?15. Maternal metabolic disorder (DM, PKU, etc)  no ?16. Patient or FOB with a child with a birth defect not listed above no  ?16a. Patient or FOB with a birth defect themselves no ?17. Recurrent pregnancy loss, or stillbirth  no  ?18. Any medications since LMP other than prenatal vitamins (include vitamins, supplements, OTC meds, drugs, alcohol)  no ?19. Any other genetic/environmental exposure to discuss  no ? ?Infection History:  ? ?1. Lives with someone with TB or TB exposed  no  ?2. Patient or partner has history of genital herpes  no ?3. Rash or viral illness since LMP  no ?4. History of STI (GC, CT, HPV, syphilis, HIV)  no ?5. History of recent travel :  no ? ?Other pertinent information:  no  ? ? ? ?Review of Systems:10 point review of systems negative unless otherwise noted in HPI ? ?Past Medical History:  ?Past Medical History:  ?Diagnosis Date  ?? Allergy   ?? HPV in female   ?? Hyperthyroidism 08/07/2014  ?? Supervision of high risk pregnancy, antepartum 06/28/2019  ? Clinic [facility] Prenatal Labs  Dating 6 week ultrasound Blood type: O/Negative/-- (05/20 1121)   Genetic Screen Declines AFP: [ ]  Antibody:Negative (10/27 1531) positive on 1/14- weak Anti D antibody noted.  Anatomic [place] Normal female Rubella: 3.58 (05/20 1121)  Varicella: Immune  GTT   Third 103 trimester:  RPR: Non Reactive (10/27 1531)   Rhogam [x ] 28 weeks. Given 11/23 HBsAg: Negative (05/20 112  ? ? ?Past Surgical History:  ?Past Surgical History:  ?Procedure Laterality Date  ?? KNEE SURGERY Right 02/09/2004  ?? TONSILLECTOMY    ? At age 32  ?? 75 TOOTH EXTRACTION    ? all four  ? ? ?Gynecologic History: Patient's last menstrual period was 02/27/2021 (exact date). ? ?Obstetric History: [id number] ? ?Family History:  ?Family History  ?Problem Relation Age of Onset  ?? Ovarian cysts Mother   ?     removed cyst on one side in 2018  ?? Breast cancer  Maternal Grandmother   ?? Cancer Maternal Grandfather   ?     pancreatic/esophageal  ?? Thyroid cancer Maternal Aunt   ?? Breast cancer Paternal Aunt   ?     ? age  ? ? ?Social History:  ?Social History  ? ?Socioeconomic History  ?? Marital status: Married  ?  Spouse name: Josh  ?? Number of children: Not on file  ?? Years of education: Not on file  ?? Highest education level: Not on file  ?Occupational History  ?? Not on file  ?Tobacco Use  ?? Smoking status: Former  ?  Types: Cigarettes, E-cigarettes  ?  Quit date: 03/24/2021  ?  Years since quitting: 0.0  ?? Smokeless tobacco: Never  ?Vaping Use  ?? Vaping Use: Former  ?Substance and Sexual Activity  ?? Alcohol use: No  ?  Alcohol/week: 0.0 standard drinks  ?? Drug use: No  ?? Sexual activity: Yes  ?  Partners: Male  ?  Birth control/protection: Other-see comments  ?  Comment: undecided   ?Other Topics Concern  ?? Not on file  ?Social History Narrative  ?? Not on file  ? ?Social Determinants of Health  ? ?Financial Resource Strain: Not on file  ?Food Insecurity: Not on file  ?Transportation Needs: Not on file  ?Physical Activity: Not on file  ?Stress: Not on file  ?Social Connections: Not on file  ?Intimate Partner Violence: Not on file  ? ? ?Allergies:  ?Allergies  ?Allergen Reactions  ?? Penicillins Hives  ? ? ?Medications: ?Prior to Admission medications   ?Medication Sig Start Date End Date Taking? Authorizing Provider  ?Prenatal Vit-Fe Fumarate-FA (MULTIVITAMIN-PRENATAL) 27-0.8 MG TABS tablet Take 1 tablet by mouth daily at 12 noon. ?Patient not taking: No sig reported    [provider]  ?fluticasone (FLONASE) 50 MCG/ACT nasal spray Place 1 spray into both nostrils daily. 08/13/19 08/28/19  Chase Picket, MD  ? ? ?Physical Exam ?Vitals: Blood pressure 112/70, weight 248 lb 12.8 oz (112.9 kg), last menstrual period 02/27/2021, not currently breastfeeding. ? ?General: NAD ?HEENT: normocephalic, anicteric ?Thyroid: no enlargement, no palpable  nodules ?Pulmonary: No increased work of breathing, CTAB ?Cardiovascular: RRR, distal pulses 2+ ?Abdomen: NABS, soft, non-tender, non-distended.  Umbilicus without lesions.  No hepatomegaly, splenomegaly or masses palpable. No evidence of hernia  ?Genitourinary: ? External: Normal external female genitalia.  Normal urethral meatus, normal  Bartholin's and Skene's glands.   ? Vagina: Normal vaginal mucosa, no evidence of prolapse.   ? Cervix: Grossly normal in appearance, no bleeding ? Uterus: Gravid, mobile, normal contour.  No CMT ? Adnexa: ovaries non-enlarged, no adnexal masses ? Rectal: deferred ?Extremities: no edema, erythema, or tenderness ?Neurologic: Grossly intact ?Psychiatric: mood appropriate, affect full ? ? ?Assessment: 31 y.o. [id number] at [redacted]w[redacted]d presenting to initiate prenatal care ? ?Plan: ?1) Avoid alcoholic beverages. ?  2) Patient encouraged not to smoke.  ?3) Discontinue the use of all non-medicinal drugs and chemicals.  ?4) Take prenatal vitamins daily.  ?5) Nutrition, food safety (fish, cheese advisories, and high nitrite foods) and exercise discussed. ?6) Hospital and practice style discussed with cross coverage system.  ?7) Genetic Screening, such as with 1st Trimester Screening, cell free fetal DNA, AFP testing, and Ultrasound, as well as with amniocentesis and CVS as appropriate, is discussed with patient. At the conclusion of today's visit patient declined genetic testing ?8) Patient is asked about travel to areas at risk for the Zika virus, and counseled to avoid travel and exposure to mosquitoes or sexual partners who may have themselves been exposed to the virus. Testing is discussed, and will be ordered as appropriate.  ?9) Dating ultrasound ordered.  ?10) Pap and Aptima collected today. Coloposcopy and monitoring planned dependent on results of Pap.  ?11) Return OB visit in 4 weeks.  ? ?Conan Bowens, SNM ?Imagene Riches, CNM  ?04/15/2021 3:29 PM  ?

## 2021-04-17 ENCOUNTER — Encounter: Payer: Self-pay | Admitting: Obstetrics and Gynecology

## 2021-04-17 ENCOUNTER — Other Ambulatory Visit: Payer: Self-pay

## 2021-04-17 ENCOUNTER — Ambulatory Visit (INDEPENDENT_AMBULATORY_CARE_PROVIDER_SITE_OTHER): Payer: Medicaid Other | Admitting: Obstetrics and Gynecology

## 2021-04-17 VITALS — BP 120/70 | Ht 68.0 in | Wt 249.0 lb

## 2021-04-17 DIAGNOSIS — D069 Carcinoma in situ of cervix, unspecified: Secondary | ICD-10-CM | POA: Diagnosis not present

## 2021-04-17 DIAGNOSIS — N871 Moderate cervical dysplasia: Secondary | ICD-10-CM

## 2021-04-17 LAB — CERVICOVAGINAL ANCILLARY ONLY
Bacterial Vaginitis (gardnerella): NEGATIVE
Chlamydia: NEGATIVE
Comment: NEGATIVE
Comment: NEGATIVE
Comment: NEGATIVE
Comment: NORMAL
Neisseria Gonorrhea: NEGATIVE
Trichomonas: NEGATIVE

## 2021-04-17 NOTE — Progress Notes (Signed)
? ?Patient ID: Brenda Villegas, female   DOB: 1990-12-09, 31 y.o.   MRN: [id number] ? ?Reason for Consult: Follow-up (Colposcopy?) ?  ?Referred by Margaretann Loveless, P* ? ?Subjective:  ?   ?HPI: ? ?Brenda Villegas is a 31 y.o. female she is here today for colposcopy however she does not want to have the colposcopy performed.  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  She had a Pap smear performed earlier this week with Paula Compton.  She would like to see the result of the Pap smear performed before planning any future colposcopies. ? ?Patient's last menstrual period was 02/27/2021 (exact date). ? ? ?Past Medical History:  ?Diagnosis Date  ? Allergy   ? HPV in female   ? Hyperthyroidism 08/07/2014  ? Supervision of high risk pregnancy, antepartum 06/28/2019  ? Clinic [facility] Prenatal Labs  Dating 6 week ultrasound Blood type: O/Negative/-- (05/20 1121)   Genetic Screen Declines AFP: [ ]  Antibody:Negative (10/27 1531) positive on 1/14- weak Anti D antibody noted.  Anatomic 2/14 Normal female Rubella: 3.58 (05/20 1121)  Varicella: Immune  GTT   Third 103 trimester:  RPR: Non Reactive (10/27 1531)   Rhogam [x ] 28 weeks. Given 11/23 HBsAg: Negative (05/20 112  ? ?Family History  ?Problem Relation Age of Onset  ? Ovarian cysts Mother   ?     removed cyst on one side in 2018  ? Breast cancer Maternal Grandmother   ? Cancer Maternal Grandfather   ?     pancreatic/esophageal  ? Thyroid cancer Maternal Aunt   ? Breast cancer Paternal Aunt   ?     ? age  ? ?Past Surgical History:  ?Procedure Laterality Date  ? KNEE SURGERY Right 02/09/2004  ? TONSILLECTOMY    ? At age 31  ? WISDOM TOOTH EXTRACTION    ? all four  ? ? ?Short Social History:  ?Social History  ? ?Tobacco Use  ? Smoking status: Former  ?  Types: Cigarettes, E-cigarettes  ?  Quit date: 03/24/2021  ?  Years since quitting: 0.0  ? Smokeless tobacco: Never  ?Substance Use Topics  ? Alcohol use:  No  ?  Alcohol/week: 0.0 standard drinks  ? ? ?Allergies  ?Allergen Reactions  ? Penicillins Hives  ? ? ?Current Outpatient Medications  ?Medication Sig Dispense Refill  ? Prenatal Vit-Fe Fumarate-FA (MULTIVITAMIN-PRENATAL) 27-0.8 MG TABS tablet Take 1 tablet by mouth daily at 12 noon.    ? ?No current facility-administered medications for this visit.  ? ? ?Review of Systems  ?Constitutional: Negative for chills, fatigue, fever and unexpected weight change.  ?HENT: Negative for trouble swallowing.  ?Eyes: Negative for loss of vision.  ?Respiratory: Negative for cough, shortness of breath and wheezing.  ?Cardiovascular: Negative for chest pain, leg swelling, palpitations and syncope.  ?GI: Negative for abdominal pain, blood in stool, diarrhea, nausea and vomiting.  ?GU: Negative for difficulty urinating, dysuria, frequency and hematuria.  ?Musculoskeletal: Negative for back pain, leg pain and joint pain.  ?Skin: Negative for rash.  ?Neurological: Negative for dizziness, headaches, light-headedness, numbness and seizures.  ?Psychiatric: Negative for behavioral problem, confusion, depressed mood and sleep disturbance.   ? ?   ?Objective:  ?Objective  ? ?Vitals:  ? 04/17/21 0949  ?BP: 120/70  ?Weight: 249 lb (112.9 kg)  ?Height: 5\' 8"  (1.727 m)  ? ?Body mass index is 37.86 kg/m?. ? ?Physical Exam ?  Vitals and nursing note reviewed. Exam conducted with a chaperone present.  ?Constitutional:   ?   Appearance: Normal appearance.  ?HENT:  ?   Head: Normocephalic and atraumatic.  ?Eyes:  ?   Extraocular Movements: Extraocular movements intact.  ?   Pupils: Pupils are equal, round, and reactive to light.  ?Cardiovascular:  ?   Rate and Rhythm: Normal rate and regular rhythm.  ?Pulmonary:  ?   Effort: Pulmonary effort is normal.  ?   Breath sounds: Normal breath sounds.  ?Abdominal:  ?   General: Abdomen is flat.  ?   Palpations: Abdomen is soft.  ?Musculoskeletal:  ?   Cervical back: Normal range of motion.  ?Skin: ?    General: Skin is warm and dry.  ?Neurological:  ?   General: No focal deficit present.  ?   Mental Status: She is alert and oriented to person, place, and time.  ?Psychiatric:     ?   Behavior: Behavior normal.     ?   Thought Content: Thought content normal.     ?   Judgment: Judgment normal.  ? ?Bedside ultrasound was performed.  Intrauterine pregnancy seen with positive fetal heart rate.  She has formal dating ultrasound coming up. ? ?Assessment/Plan:  ?  ? 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  Discussed that in the setting of pregnancy we generally recommend colposcopy every 12 weeks while pregnant.  She can plan this in the future when she has passed the first trimester which she is most comfortable with.  Continue with regular OB care as planned. ? ?More than 10 minutes were spent face to face with the patient in the room, reviewing the medical record, labs and images, and coordinating care for the patient. The plan of management was discussed in detail and counseling was provided.  ? ?  ? ?Adelene Idler MD ?[facility], [facility] ?04/17/2021 ?10:42 AM ? ? ?

## 2021-04-20 LAB — CYTOLOGY - PAP
Comment: NEGATIVE
Diagnosis: NEGATIVE
High risk HPV: NEGATIVE

## 2021-04-22 ENCOUNTER — Encounter: Payer: Self-pay | Admitting: Obstetrics and Gynecology

## 2021-05-07 ENCOUNTER — Ambulatory Visit (INDEPENDENT_AMBULATORY_CARE_PROVIDER_SITE_OTHER): Payer: Medicaid Other | Admitting: Obstetrics

## 2021-05-07 ENCOUNTER — Ambulatory Visit (INDEPENDENT_AMBULATORY_CARE_PROVIDER_SITE_OTHER): Payer: Medicaid Other

## 2021-05-07 VITALS — BP 118/70 | Wt 247.0 lb

## 2021-05-07 DIAGNOSIS — Z1379 Encounter for other screening for genetic and chromosomal anomalies: Secondary | ICD-10-CM

## 2021-05-07 DIAGNOSIS — Z3A10 10 weeks gestation of pregnancy: Secondary | ICD-10-CM

## 2021-05-07 DIAGNOSIS — R87810 Cervical high risk human papillomavirus (HPV) DNA test positive: Secondary | ICD-10-CM

## 2021-05-07 DIAGNOSIS — Z3480 Encounter for supervision of other normal pregnancy, unspecified trimester: Secondary | ICD-10-CM

## 2021-05-07 DIAGNOSIS — O0990 Supervision of high risk pregnancy, unspecified, unspecified trimester: Secondary | ICD-10-CM

## 2021-05-07 DIAGNOSIS — Z8639 Personal history of other endocrine, nutritional and metabolic disease: Secondary | ICD-10-CM

## 2021-05-07 NOTE — Progress Notes (Signed)
No vb. No lof. Pt needs RX medication for PRN. Dating scan today.  ?

## 2021-05-07 NOTE — Progress Notes (Signed)
Routine Prenatal Care Visit ? ?Subjective  ?Brenda Villegas is a 31 y.o. G3P2002 at [redacted]w[redacted]d being seen today for ongoing prenatal care.  She is currently monitored for the following issues for this high-risk pregnancy and has Hyperthyroidism; H/O Graves' disease; Rh negative state in antepartum period; and Supervision of high risk pregnancy, antepartum on their problem list.  ?----------------------------------------------------------------------------------- ?Patient reports no complaints.   ? . Vag. Bleeding: None.   . Leaking Fluid denies.  ?----------------------------------------------------------------------------------- ?The following portions of the patient's history were reviewed and updated as appropriate: allergies, current medications, past family history, past medical history, past social history, past surgical history and problem list. Problem list updated. ? ?Objective  ?Blood pressure 118/70, weight 247 lb (112 kg), last menstrual period 02/27/2021, not currently breastfeeding. ?Pregravid weight 241 lb (109.3 kg) Total Weight Gain 6 lb (2.722 kg) ?Urinalysis: Urine Protein    Urine Glucose   ? ?Fetal Status:          ? ?General:  Alert, oriented and cooperative. Patient is in no acute distress.  ?Skin: Skin is warm and dry. No rash noted.   ?Cardiovascular: Normal heart rate noted  ?Respiratory: Normal respiratory effort, no problems with respiration noted  ?Abdomen: Soft, gravid, appropriate for gestational age. Pain/Pressure: Absent     ?Pelvic:  Cervical exam deferred        ?Extremities: Normal range of motion.     ?Mental Status: Normal mood and affect. Normal behavior. Normal judgment and thought content.  ? ?Assessment  ? ?31 y.o. [id number] at [redacted]w[redacted]d by  12/04/2021, by Last Menstrual Period presenting for routine prenatal visit ? ?Plan  ? ?third Problems (from 04/15/21 to present)   ? Problem Noted Resolved  ? Supervision of high risk pregnancy, antepartum 04/15/2021 by Imagene Riches, CNM No   ? Overview Addendum 04/15/2021  3:46 PM by Imagene Riches, CNM  ?   ?Nursing Staff Provider  ?Office Location  [facility] Dating  ordered  ?Language  English Anatomy US    ?Flu Vaccine   Genetic Screen  NIPS:   ?TDaP vaccine    Hgb A1C or  ?GTT Early : ?Third trimester :   ?Covid    LAB RESULTS   ?Rhogam   Blood Type     ?Feeding Plan  Antibody    ?Contraception  Rubella    ?Circumcision  RPR     ?Pediatrician   HBsAg     ?Support Person  HIV    ?Prenatal Classes  Varicella   ?  GBS  (For PCN allergy, check sensitivities)   ?BTL Consent     ?VBAC Consent  Pap  04/15/21  ?  Hgb Electro    ?  CF   ?   SMA   ?     ? ? ?  ?  ?  ?  ? ?Preterm labor symptoms and general obstetric precautions including but not limited to vaginal bleeding, contractions, leaking of fluid and fetal movement were reviewed in detail with the patient. ?Please refer to After Visit Summary for other counseling recommendations.  ? ?Return in about 4 weeks (around 06/04/2021) for return OB with MD.  ?She desires cell free DNA today. ?Discussed her hx of pap smears. Had normal paps till 2022, when she had high risk HPV and after colpo saw HGSIL and LGSIL with CIN2 on sample. ?She was unclear about the need for colpo q trimester. ?TST check with her prenatal labs today. ?Imagene Riches, CNM  ?05/07/2021  3:27 PM  ? ? ?Imagene Riches, CNM  ?05/07/2021 3:10 PM   ? ?

## 2021-05-08 ENCOUNTER — Other Ambulatory Visit: Payer: Self-pay | Admitting: Obstetrics

## 2021-05-08 DIAGNOSIS — Z8639 Personal history of other endocrine, nutritional and metabolic disease: Secondary | ICD-10-CM

## 2021-05-08 LAB — CBC/D/PLT+RPR+RH+ABO+RUBIGG...
Antibody Screen: NEGATIVE
Basophils Absolute: 0 10*3/uL (ref 0.0–0.2)
Basos: 0 %
EOS (ABSOLUTE): 0.3 10*3/uL (ref 0.0–0.4)
Eos: 3 %
HCV Ab: NONREACTIVE
HIV Screen 4th Generation wRfx: NONREACTIVE
Hematocrit: 39.9 % (ref 34.0–46.6)
Hemoglobin: 13.7 g/dL (ref 11.1–15.9)
Hepatitis B Surface Ag: NEGATIVE
Immature Grans (Abs): 0 10*3/uL (ref 0.0–0.1)
Immature Granulocytes: 0 %
Lymphocytes Absolute: 2.1 10*3/uL (ref 0.7–3.1)
Lymphs: 23 %
MCH: 28.7 pg (ref 26.6–33.0)
MCHC: 34.3 g/dL (ref 31.5–35.7)
MCV: 84 fL (ref 79–97)
Monocytes Absolute: 0.5 10*3/uL (ref 0.1–0.9)
Monocytes: 5 %
Neutrophils Absolute: 6.5 10*3/uL (ref 1.4–7.0)
Neutrophils: 69 %
Platelets: 196 10*3/uL (ref 150–450)
RBC: 4.77 x10E6/uL (ref 3.77–5.28)
RDW: 13.6 % (ref 11.7–15.4)
RPR Ser Ql: NONREACTIVE
Rh Factor: NEGATIVE
Rubella Antibodies, IGG: 5.44 index (ref >0.99)
Varicella zoster IgG: >4000 index (ref >165)
WBC: 9.4 10*3/uL (ref 3.4–10.8)

## 2021-05-08 LAB — HCV INTERPRETATION

## 2021-05-08 LAB — TSH+FREE T4
Free T4: 1.17 ng/dL (ref 0.82–1.77)
TSH: 0.617 u[IU]/mL (ref 0.450–4.500)

## 2021-05-08 MED ORDER — LEVOTHYROXINE SODIUM 125 MCG PO TABS: 125.0000 ug | ORAL_TABLET | Freq: Every day | ORAL | 1 refills | Status: DC

## 2021-05-08 NOTE — Progress Notes (Signed)
New OB labs show elevated TSH (7.720) Patient notified by phone and order for Levothyroxine 125 mcg po qd is ordered for her.  Note placed in chart to have Dr. Elly Modena discuss and recheck her TSH at next visit. Labs preordered for the 4/28  visit ? ?Brenda Villegas, CNM  ?05/08/2021 11:44 AM  ? ?

## 2021-05-12 LAB — MATERNIT 21 PLUS CORE, BLOOD
Fetal Fraction: 6
Result (T21): NEGATIVE
Trisomy 13 (Patau syndrome): NEGATIVE
Trisomy 18 (Edwards syndrome): NEGATIVE
Trisomy 21 (Down syndrome): NEGATIVE

## 2021-06-05 ENCOUNTER — Encounter: Payer: Medicaid Other | Admitting: Obstetrics and Gynecology

## 2021-06-08 ENCOUNTER — Ambulatory Visit (INDEPENDENT_AMBULATORY_CARE_PROVIDER_SITE_OTHER): Payer: Medicaid Other | Admitting: Family Medicine

## 2021-06-08 VITALS — BP 120/80 | Wt 250.0 lb

## 2021-06-08 DIAGNOSIS — Z3A14 14 weeks gestation of pregnancy: Secondary | ICD-10-CM

## 2021-06-08 DIAGNOSIS — E039 Hypothyroidism, unspecified: Secondary | ICD-10-CM

## 2021-06-08 DIAGNOSIS — Z8639 Personal history of other endocrine, nutritional and metabolic disease: Secondary | ICD-10-CM

## 2021-06-08 DIAGNOSIS — O0990 Supervision of high risk pregnancy, unspecified, unspecified trimester: Secondary | ICD-10-CM

## 2021-06-08 DIAGNOSIS — O99212 Obesity complicating pregnancy, second trimester: Secondary | ICD-10-CM | POA: Insufficient documentation

## 2021-06-08 DIAGNOSIS — Z6791 Unspecified blood type, Rh negative: Secondary | ICD-10-CM

## 2021-06-08 DIAGNOSIS — O26899 Other specified pregnancy related conditions, unspecified trimester: Secondary | ICD-10-CM

## 2021-06-08 NOTE — Progress Notes (Signed)
? ?  PRENATAL VISIT NOTE ? ?Subjective:  ?Brenda Villegas is a 31 y.o. G3P2002 at [redacted]w[redacted]d being seen today for ongoing prenatal care.  She is currently monitored for the following issues for this low-risk pregnancy and has Hypothyroidism; H/O Graves' disease; Rh negative state in antepartum period; Supervision of high risk pregnancy, antepartum; Pap smear of cervix shows high risk HPV present; and Obesity affecting pregnancy in second trimester on their problem list. ? ?Patient reports no complaints.   . Vag. Bleeding: None.  Movement: Absent. Denies leaking of fluid.  ? ?The following portions of the patient's history were reviewed and updated as appropriate: allergies, current medications, past family history, past medical history, past social history, past surgical history and problem list.  ? ?Objective:  ? ?Vitals:  ? 06/08/21 0925  ?BP: 120/80  ?Weight: 250 lb (113.4 kg)  ? ? ?Fetal Status: Fetal Heart Rate (bpm): 155 Fundal Height: 14 cm Movement: Absent    ? ?General:  Alert, oriented and cooperative. Patient is in no acute distress.  ?Skin: Skin is warm and dry. No rash noted.   ?Cardiovascular: Normal heart rate noted  ?Respiratory: Normal respiratory effort, no problems with respiration noted  ?Abdomen: Soft, gravid, appropriate for gestational age.  Pain/Pressure: Absent     ?Pelvic: Cervical exam deferred        ?Extremities: Normal range of motion.     ?Mental Status: Normal mood and affect. Normal behavior. Normal judgment and thought content.  ? ?Assessment and Plan:  ?Pregnancy: G3P2002 at [redacted]w[redacted]d ?1. Supervision of high risk pregnancy, antepartum ?Up to date ?Reviewed involving Endo more for postpartum care.  ?- Ambulatory referral to Endocrinology ?- Thyroid antibodies; Future ?- [place] MFM OB DETAIL +14 WK; Future ?- TSH + free T4; Future ? ?2. H/O Graves' disease ?- Ambulatory referral to Endocrinology ?- Thyroid antibodies; Future ?- [place] MFM OB DETAIL +14 WK; Future ?- TSH + free T4; Future ? ?3.  Acquired hypothyroidism ?- Ambulatory referral to Endocrinology ?- Thyroid antibodies; Future ?- [place] MFM OB DETAIL +14 WK; Future ?- TSH + free T4; Future ? ?4. Rh negative state in antepartum period ?Rhogam at 28 ? ?5. Obesity affecting pregnancy in second trimester ?TWG=9 lb (4.082 kg)  ? ?Preterm labor symptoms and general obstetric precautions including but not limited to vaginal bleeding, contractions, leaking of fluid and fetal movement were reviewed in detail with the patient. ?Please refer to After Visit Summary for other counseling recommendations.  ? ?Return in about 4 weeks (around 07/06/2021) for Routine prenatal care. ? ?Future Appointments  ?Date Time Provider [place]  ?07/07/2021 11:15 AM Dominic, Nunzio Cobbs, CNM [facility] None  ?07/16/2021  8:00 AM [facility]  ? ? ?Caren Macadam, MD ?

## 2021-06-10 ENCOUNTER — Telehealth: Payer: Self-pay

## 2021-06-10 NOTE — Telephone Encounter (Signed)
Pt called on 3/2 at 4:42 left a voice message reporting tingling in tongue and right arm, I called pt back no answer or Voice mail available. ?

## 2021-06-24 ENCOUNTER — Ambulatory Visit (INDEPENDENT_AMBULATORY_CARE_PROVIDER_SITE_OTHER): Payer: Medicaid Other | Admitting: Obstetrics

## 2021-06-24 ENCOUNTER — Encounter: Payer: Self-pay | Admitting: Obstetrics

## 2021-06-24 VITALS — BP 126/70 | Wt 250.0 lb

## 2021-06-24 DIAGNOSIS — Z6791 Unspecified blood type, Rh negative: Secondary | ICD-10-CM

## 2021-06-24 DIAGNOSIS — O0990 Supervision of high risk pregnancy, unspecified, unspecified trimester: Secondary | ICD-10-CM

## 2021-06-24 DIAGNOSIS — Z87410 Personal history of cervical dysplasia: Secondary | ICD-10-CM

## 2021-06-24 DIAGNOSIS — O99212 Obesity complicating pregnancy, second trimester: Secondary | ICD-10-CM

## 2021-06-24 DIAGNOSIS — R300 Dysuria: Secondary | ICD-10-CM

## 2021-06-24 DIAGNOSIS — E079 Disorder of thyroid, unspecified: Secondary | ICD-10-CM

## 2021-06-24 DIAGNOSIS — O99280 Endocrine, nutritional and metabolic diseases complicating pregnancy, unspecified trimester: Secondary | ICD-10-CM

## 2021-06-24 DIAGNOSIS — Z3A16 16 weeks gestation of pregnancy: Secondary | ICD-10-CM

## 2021-06-24 DIAGNOSIS — O26899 Other specified pregnancy related conditions, unspecified trimester: Secondary | ICD-10-CM

## 2021-06-24 DIAGNOSIS — O2340 Unspecified infection of urinary tract in pregnancy, unspecified trimester: Secondary | ICD-10-CM

## 2021-06-24 LAB — POCT URINALYSIS DIPSTICK
Bilirubin, UA: NEGATIVE
Blood, UA: POSITIVE
Glucose, UA: NEGATIVE
Ketones, UA: NEGATIVE
Nitrite, UA: NEGATIVE
Protein, UA: NEGATIVE
Spec Grav, UA: 1.015 (ref 1.010–1.025)
Urobilinogen, UA: NEGATIVE E.U./dL — AB
pH, UA: 7 (ref 5.0–8.0)

## 2021-06-24 MED ORDER — NITROFURANTOIN MONOHYD MACRO 100 MG PO CAPS
100.0000 mg | ORAL_CAPSULE | Freq: Two times a day (BID) | ORAL | 1 refills | Status: DC
Start: 2021-06-24 — End: 2021-08-10

## 2021-06-24 NOTE — Progress Notes (Signed)
Patient returns for pregnancy visit with complaints of UTI symptoms. Reports UTI symptoms since last night - burning and fullness. Pt is constipated has not had BM in 2 days. Has some cramping from that.  ?Patient reports flutters fetal movement, and denies vaginal bleeding or discharge.  Patient denies nausea or vomiting. Patient denies vaginal discharge, odor, burning or itching. ? ?BP 126/70   Wt 250 lb (113.4 kg)   LMP 02/27/2021 (Exact Date)   BMI 38.01 kg/m?   ? ?Physical Exam ?Vitals and nursing note reviewed.  ?Constitutional:   ?   Appearance: Normal appearance.  ?HENT:  ?   Head: Normocephalic and atraumatic.  ?Eyes:  ?   Extraocular Movements: Extraocular movements intact.  ?Pulmonary:  ?   Effort: Pulmonary effort is normal.  ?Abdominal:  ?   Palpations: Mass: FH as documented.  ?Musculoskeletal:     ?   General: Normal range of motion.  ?   Cervical back: Normal range of motion.  ?Skin: ?   General: Skin is warm and dry.  ?Neurological:  ?   General: No focal deficit present.  ?   Mental Status: She is alert and oriented to person, place, and time.  ?Psychiatric:     ?   Mood and Affect: Mood normal.     ?   Behavior: Behavior normal.     ?   Thought Content: Thought content normal.  ? ? ?Assessment  ?Dysuria - Plan: POCT Urinalysis Dipstick, Urine Culture ? ?UTI in pregnancy, antepartum - Plan: nitrofurantoin, macrocrystal-monohydrate, (MACROBID) 100 MG capsule ? ?Obesity affecting pregnancy in second trimester - Plan: Protein / creatinine ratio, urine ? ?Thyroid disease affecting pregnancy ? ?Plan: ?Patient Brenda Villegas is an 31 y.o. year old G3P2002 at [redacted]w[redacted]d with [facility] of Estimated Date of Delivery: 12/04/21 ?O neg RI VI hgb  ?Rh neg - rhogam 28 weeks  ?UTI - UA + blood and leuks. culture pending. Will treat with macrobid and follow sensitivities. No Baseline urine cx found. 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 last pap NIL HPV neg. Current plan for postpartum colpo.  ?Obesity BMI 38 - for  anatomy US with MFM, recommend daily ASA 81. Recommend baseline CMP with next blood draw. PC ratio today. TWG 9 lbs. Plan serial growth [place].  ?History Graves - on Synthroid 125. Last labs stable TSH 0/6, FT4 1.17. Has second trimester labs ordered. S/p endo referral.  ?Genetics - CFDNA low risk female, for AFP next visit.  ? ?Return for keep scheduled Ob visit . ? ? ?

## 2021-06-24 NOTE — Patient Instructions (Addendum)
Please call if you have elevated blood pressure, vision changes, headache, right-sided upper abdominal pain.  Please call if you have contractions occurring every 5-10 minutes for 1-2 hours, if you have vaginal bleeding, if watery fluid is leaking from your vagina, or if you have decreased fetal movement.  If you are not yet signed up on [facility] MyChart, please ask us how to sign up for it!  

## 2021-06-24 NOTE — Progress Notes (Signed)
No vb. No lof. Pt having some UTI symptoms. Dysuria and not "feeling empty" after using the restroom.  ?

## 2021-06-29 LAB — URINE CULTURE

## 2021-07-07 ENCOUNTER — Encounter: Payer: Medicaid Other | Admitting: Licensed Practical Nurse

## 2021-07-13 ENCOUNTER — Ambulatory Visit (INDEPENDENT_AMBULATORY_CARE_PROVIDER_SITE_OTHER): Payer: Medicaid Other | Admitting: Licensed Practical Nurse

## 2021-07-13 VITALS — BP 120/60 | Wt 250.0 lb

## 2021-07-13 DIAGNOSIS — E039 Hypothyroidism, unspecified: Secondary | ICD-10-CM

## 2021-07-13 DIAGNOSIS — O0990 Supervision of high risk pregnancy, unspecified, unspecified trimester: Secondary | ICD-10-CM

## 2021-07-13 DIAGNOSIS — N390 Urinary tract infection, site not specified: Secondary | ICD-10-CM

## 2021-07-13 DIAGNOSIS — Z8639 Personal history of other endocrine, nutritional and metabolic disease: Secondary | ICD-10-CM

## 2021-07-13 DIAGNOSIS — Z3A19 19 weeks gestation of pregnancy: Secondary | ICD-10-CM

## 2021-07-13 LAB — POCT URINALYSIS DIPSTICK OB
Glucose, UA: NEGATIVE
POC,PROTEIN,UA: NEGATIVE

## 2021-07-13 NOTE — Progress Notes (Signed)
Routine Prenatal Care Visit  Subjective  Brenda Villegas is a 31 y.o. G3P2002 at [redacted]w[redacted]d being seen today for ongoing prenatal care.  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  ----------------------------------------------------------------------------------- Patient reports Has had 2 episodes of tingling in her fingers and tongue.  With the last episode, she was driving, lost her vision for a few seconds/minute.  Wonders if it is her Thyroid medication, as she has never been on this medication.  Consider seeing neuro if these symptoms persist.  -was prescribed Macrobid for UTI, culture showed resistance to Macrobid, she was called and notified but was never prescribed another medication. Reports her symptoms are gone.  Offered new script for an antibiotic or recheck urine in 2 weeks  (4wks from [facility]) Will return in 2 weeks to repeat urine culture.  -reviewed starting baby ASA to reduce risk of Preeclampsia  Contractions: Not present. Vag. Bleeding: None.  Movement: Present. Leaking Fluid denies.  ----------------------------------------------------------------------------------- The following portions of the patient's history were reviewed and updated as appropriate: allergies, current medications, past family history, past medical history, past social history, past surgical history and problem list. Problem list updated.  Objective  Blood pressure 120/60, weight 250 lb (113.4 kg), last menstrual period 02/27/2021, not currently breastfeeding. Pregravid weight 241 lb (109.3 kg) Total Weight Gain 9 lb (4.082 kg) Urinalysis: Urine Protein Negative  Urine Glucose Negative  Fetal Status: Fetal Heart Rate  (bpm): 150   Movement: Present     General:  Alert, oriented and cooperative. Patient is in no acute distress.  Skin: Skin is warm and dry. No rash noted.   Cardiovascular: Normal heart rate noted  Respiratory: Normal respiratory effort, no problems with respiration noted  Abdomen: Soft, gravid, appropriate for gestational age. Pain/Pressure: Absent     Pelvic:  Cervical exam deferred        Extremities: Normal range of motion.     Mental Status: Normal mood and affect. Normal behavior. Normal judgment and thought content.   Assessment   30 y.o. [id number] at [redacted]w[redacted]d by  12/04/2021, by Last Menstrual Period presenting for routine prenatal visit  Plan   third Problems (from 04/15/21 to present)     Problem Noted Resolved   Supervision of high risk pregnancy, antepartum 04/15/2021 by Mirna Mires, CNM No   Overview Addendum 05/13/2021  5:46 PM by Mirna Mires, CNM     Nursing Staff Provider  Office Location  [facility] Dating  ordered  Language  English Anatomy US    Flu Vaccine   Genetic Screen  NIPS: neg, xx  TDaP vaccine    Hgb A1C or  GTT Early : Third trimester :   Covid    O  Rhogam   Blood Type negative  Feeding Plan  Antibody  neg  Contraception  Rubella  immune  Circumcision  RPR   neg  Pediatrician   HBsAg   neg  Support Person  HIV  neg  Prenatal Classes  Varicella immune    GBS  (For PCN allergy, check sensitivities)   BTL Consent     VBAC Consent  Pap  04/15/21    Hgb Electro      CF      SMA  Hypothyroidism 08/07/2014 by Margaretann Loveless, PA-C No        general obstetric precautions including but not limited to vaginal bleeding, contractions, leaking of fluid and fetal movement were reviewed in detail with the patient. Please refer to After Visit Summary for other counseling recommendations.   Return in about 4 weeks (around 08/10/2021) for ROB.  Urine culture in 2 weeks   Carie Caddy, CNM  [facility], [facility]  07/13/21  11:29 AM

## 2021-07-16 ENCOUNTER — Ambulatory Visit: Payer: Medicaid Other | Attending: Obstetrics

## 2021-07-16 ENCOUNTER — Other Ambulatory Visit: Payer: Self-pay

## 2021-07-16 DIAGNOSIS — O99282 Endocrine, nutritional and metabolic diseases complicating pregnancy, second trimester: Secondary | ICD-10-CM | POA: Insufficient documentation

## 2021-07-16 DIAGNOSIS — E669 Obesity, unspecified: Secondary | ICD-10-CM | POA: Diagnosis not present

## 2021-07-16 DIAGNOSIS — Z3A19 19 weeks gestation of pregnancy: Secondary | ICD-10-CM | POA: Diagnosis not present

## 2021-07-16 DIAGNOSIS — E079 Disorder of thyroid, unspecified: Secondary | ICD-10-CM | POA: Insufficient documentation

## 2021-07-16 DIAGNOSIS — O0990 Supervision of high risk pregnancy, unspecified, unspecified trimester: Secondary | ICD-10-CM | POA: Diagnosis not present

## 2021-07-16 DIAGNOSIS — E039 Hypothyroidism, unspecified: Secondary | ICD-10-CM | POA: Diagnosis not present

## 2021-07-16 DIAGNOSIS — O99212 Obesity complicating pregnancy, second trimester: Secondary | ICD-10-CM | POA: Diagnosis not present

## 2021-07-16 DIAGNOSIS — Z363 Encounter for antenatal screening for malformations: Secondary | ICD-10-CM | POA: Diagnosis not present

## 2021-07-16 DIAGNOSIS — Z8639 Personal history of other endocrine, nutritional and metabolic disease: Secondary | ICD-10-CM | POA: Insufficient documentation

## 2021-07-16 DIAGNOSIS — O0992 Supervision of high risk pregnancy, unspecified, second trimester: Secondary | ICD-10-CM | POA: Diagnosis present

## 2021-07-16 LAB — TSH+FREE T4
Free T4: 1.03 ng/dL (ref 0.82–1.77)
TSH: 1.41 u[IU]/mL (ref 0.450–4.500)

## 2021-07-16 LAB — THYROID ANTIBODIES
Thyroglobulin Antibody: 1.7 IU/mL — ABNORMAL HIGH (ref 0.0–0.9)
Thyroperoxidase Ab SerPl-aCnc: 165 IU/mL — ABNORMAL HIGH (ref 0–34)

## 2021-07-20 ENCOUNTER — Other Ambulatory Visit: Payer: Self-pay | Admitting: Obstetrics

## 2021-07-20 DIAGNOSIS — Z8639 Personal history of other endocrine, nutritional and metabolic disease: Secondary | ICD-10-CM

## 2021-08-03 ENCOUNTER — Telehealth: Payer: Self-pay

## 2021-08-03 ENCOUNTER — Ambulatory Visit (INDEPENDENT_AMBULATORY_CARE_PROVIDER_SITE_OTHER): Payer: Medicaid Other

## 2021-08-03 VITALS — BP 128/66 | Ht 68.0 in | Wt 246.0 lb

## 2021-08-03 DIAGNOSIS — H538 Other visual disturbances: Secondary | ICD-10-CM

## 2021-08-03 LAB — POCT URINALYSIS DIPSTICK OB: Glucose, UA: NEGATIVE

## 2021-08-03 NOTE — Progress Notes (Signed)
Patient here for nurse visit BP check as advised per triage nurse for complaints of blurred vision and occasional floaters. Denies headache or swelling.  BP Readings from Last 3 Encounters:  08/03/21 128/66  07/16/21 120/77  07/13/21 120/60   Pulse Readings from Last 3 Encounters:  07/16/21 71  02/22/20 63  02/15/20 87   Urinalysis performed to check for protein: negative for all components  Patient will keep future appointments scheduled and notify us of any changes. Advised she can check blood pressure at local pharmacy if our office is closed.   Future Appointments  Date Time Provider Department Center  08/10/2021  1:15 PM Tresea Mall, CNM [facility] None  08/27/2021 10:00 AM [facility]     Patient verbalized understanding of instructions.   Rocco Serene, LPN

## 2021-08-10 ENCOUNTER — Encounter: Payer: Self-pay | Admitting: Advanced Practice Midwife

## 2021-08-10 ENCOUNTER — Ambulatory Visit (INDEPENDENT_AMBULATORY_CARE_PROVIDER_SITE_OTHER): Payer: Medicaid Other | Admitting: Advanced Practice Midwife

## 2021-08-10 VITALS — BP 100/60 | Wt 246.0 lb

## 2021-08-10 DIAGNOSIS — Z113 Encounter for screening for infections with a predominantly sexual mode of transmission: Secondary | ICD-10-CM

## 2021-08-10 DIAGNOSIS — E039 Hypothyroidism, unspecified: Secondary | ICD-10-CM

## 2021-08-10 DIAGNOSIS — Z131 Encounter for screening for diabetes mellitus: Secondary | ICD-10-CM

## 2021-08-10 DIAGNOSIS — Z130 Encounter for screening for diseases of the blood and blood-forming organs and certain disorders involving the immune mechanism: Secondary | ICD-10-CM

## 2021-08-10 DIAGNOSIS — Z3A23 23 weeks gestation of pregnancy: Secondary | ICD-10-CM

## 2021-08-10 DIAGNOSIS — Z369 Encounter for antenatal screening, unspecified: Secondary | ICD-10-CM

## 2021-08-10 DIAGNOSIS — O0992 Supervision of high risk pregnancy, unspecified, second trimester: Secondary | ICD-10-CM

## 2021-08-10 LAB — POCT URINALYSIS DIPSTICK OB
Glucose, UA: NEGATIVE
POC,PROTEIN,UA: NEGATIVE

## 2021-08-10 MED ORDER — LEVOTHYROXINE SODIUM 50 MCG PO TABS: 50.0000 ug | ORAL_TABLET | Freq: Every day | ORAL | 2 refills | Status: DC

## 2021-08-10 NOTE — Progress Notes (Signed)
Routine Prenatal Care Visit  Subjective  Brenda Villegas is a 31 y.o. G3P2002 at [redacted]w[redacted]d being seen today for ongoing prenatal care.  She is currently monitored for the following issues for this high-risk pregnancy and has Hypothyroidism; History of Hashimoto thyroiditis; Rh negative state in antepartum period; Supervision of high risk pregnancy, antepartum; Pap smear of cervix shows high risk HPV present; and Obesity affecting pregnancy in second trimester on their problem list.  ----------------------------------------------------------------------------------- Patient reports no complaints. She has questions regarding thyroid medication and has been out of medication for the past month. Reviewed her labs and sent Rx for 50 mcg dose synthroid. Will recheck TSH at 28w lab visit.   Contractions: Not present. Vag. Bleeding: None.  Movement: Present. Leaking Fluid denies.  ----------------------------------------------------------------------------------- The following portions of the patient's history were reviewed and updated as appropriate: allergies, current medications, past family history, past medical history, past social history, past surgical history and problem list. Problem list updated.  Objective  Blood pressure 100/60, weight 246 lb (111.6 kg), last menstrual period 02/27/2021, not currently breastfeeding. Pregravid weight 241 lb (109.3 kg) Total Weight Gain 5 lb (2.268 kg) Urinalysis: Urine Protein Negative  Urine Glucose Negative  Fetal Status: Fetal Heart Rate (bpm): 145 Fundal Height: 24 cm Movement: Present     General:  Alert, oriented and cooperative. Patient is in no acute distress.  Skin: Skin is warm and dry. No rash noted.   Cardiovascular: Normal heart rate noted  Respiratory: Normal respiratory effort, no problems with respiration noted  Abdomen: Soft, gravid, appropriate for gestational age. Pain/Pressure: Absent     Pelvic:  Cervical exam deferred         Extremities: Normal range of motion.  Edema: None  Mental Status: Normal mood and affect. Normal behavior. Normal judgment and thought content.   Assessment   31 y.o. [id number] at [redacted]w[redacted]d by  12/04/2021, by Last Menstrual Period presenting for routine prenatal visit  Plan   third Problems (from 04/15/21 to present)    Problem Noted Resolved   Supervision of high risk pregnancy, antepartum 04/15/2021 by Mirna Mires, CNM No   Overview Addendum 05/13/2021  5:46 PM by Mirna Mires, CNM     Nursing Staff Provider  Office Location  [facility] Dating  ordered  Language  English Anatomy US    Flu Vaccine   Genetic Screen  NIPS: neg, xx  TDaP vaccine    Hgb A1C or  GTT Early : Third trimester :   Covid    O  Rhogam   Blood Type negative  Feeding Plan  Antibody  neg  Contraception  Rubella  immune  Circumcision  RPR   neg  Pediatrician   HBsAg   neg  Support Person  HIV  neg  Prenatal Classes  Varicella immune    GBS  (For PCN allergy, check sensitivities)   BTL Consent     VBAC Consent  Pap  04/15/21    Hgb Electro      CF      SMA               Hypothyroidism 08/07/2014 by Margaretann Loveless, PA-C No       Preterm labor symptoms and general obstetric precautions including but not limited to vaginal bleeding, contractions, leaking of fluid and fetal movement were reviewed in detail with the patient. Please refer to After Visit Summary for other counseling recommendations.   Return in about 4 weeks (around 09/07/2021) for 28  wk labs and rob.  Tresea Mall, CNM 08/10/2021 1:48 PM

## 2021-08-26 ENCOUNTER — Other Ambulatory Visit: Payer: Self-pay

## 2021-08-26 DIAGNOSIS — E063 Autoimmune thyroiditis: Secondary | ICD-10-CM

## 2021-08-26 DIAGNOSIS — O99212 Obesity complicating pregnancy, second trimester: Secondary | ICD-10-CM

## 2021-08-27 ENCOUNTER — Ambulatory Visit: Payer: Medicaid Other | Attending: Obstetrics

## 2021-08-27 ENCOUNTER — Other Ambulatory Visit: Payer: Self-pay

## 2021-08-27 ENCOUNTER — Other Ambulatory Visit: Payer: Self-pay | Admitting: Obstetrics

## 2021-08-27 VITALS — BP 120/65 | HR 67 | Temp 97.7°F | Ht 68.0 in | Wt 247.5 lb

## 2021-08-27 DIAGNOSIS — Z8639 Personal history of other endocrine, nutritional and metabolic disease: Secondary | ICD-10-CM

## 2021-08-27 DIAGNOSIS — O99212 Obesity complicating pregnancy, second trimester: Secondary | ICD-10-CM

## 2021-08-27 DIAGNOSIS — Z3A25 25 weeks gestation of pregnancy: Secondary | ICD-10-CM | POA: Diagnosis not present

## 2021-08-27 DIAGNOSIS — E063 Autoimmune thyroiditis: Secondary | ICD-10-CM | POA: Insufficient documentation

## 2021-08-27 DIAGNOSIS — O0990 Supervision of high risk pregnancy, unspecified, unspecified trimester: Secondary | ICD-10-CM

## 2021-08-27 DIAGNOSIS — O365920 Maternal care for other known or suspected poor fetal growth, second trimester, not applicable or unspecified: Secondary | ICD-10-CM | POA: Insufficient documentation

## 2021-08-27 DIAGNOSIS — E038 Other specified hypothyroidism: Secondary | ICD-10-CM

## 2021-08-27 DIAGNOSIS — O99282 Endocrine, nutritional and metabolic diseases complicating pregnancy, second trimester: Secondary | ICD-10-CM | POA: Diagnosis not present

## 2021-08-27 DIAGNOSIS — E079 Disorder of thyroid, unspecified: Secondary | ICD-10-CM | POA: Insufficient documentation

## 2021-08-27 DIAGNOSIS — E669 Obesity, unspecified: Secondary | ICD-10-CM

## 2021-09-07 ENCOUNTER — Encounter: Payer: Self-pay | Admitting: Advanced Practice Midwife

## 2021-09-07 ENCOUNTER — Ambulatory Visit (INDEPENDENT_AMBULATORY_CARE_PROVIDER_SITE_OTHER): Payer: Medicaid Other | Admitting: Advanced Practice Midwife

## 2021-09-07 ENCOUNTER — Other Ambulatory Visit: Payer: Medicaid Other

## 2021-09-07 VITALS — BP 120/80 | Wt 250.0 lb

## 2021-09-07 DIAGNOSIS — E039 Hypothyroidism, unspecified: Secondary | ICD-10-CM

## 2021-09-07 DIAGNOSIS — Z3A27 27 weeks gestation of pregnancy: Secondary | ICD-10-CM

## 2021-09-07 DIAGNOSIS — O0992 Supervision of high risk pregnancy, unspecified, second trimester: Secondary | ICD-10-CM

## 2021-09-07 DIAGNOSIS — Z131 Encounter for screening for diabetes mellitus: Secondary | ICD-10-CM

## 2021-09-07 DIAGNOSIS — Z369 Encounter for antenatal screening, unspecified: Secondary | ICD-10-CM

## 2021-09-07 DIAGNOSIS — Z130 Encounter for screening for diseases of the blood and blood-forming organs and certain disorders involving the immune mechanism: Secondary | ICD-10-CM

## 2021-09-07 DIAGNOSIS — Z113 Encounter for screening for infections with a predominantly sexual mode of transmission: Secondary | ICD-10-CM

## 2021-09-07 DIAGNOSIS — O99212 Obesity complicating pregnancy, second trimester: Secondary | ICD-10-CM

## 2021-09-07 LAB — POCT URINALYSIS DIPSTICK OB
Glucose, UA: NEGATIVE
POC,PROTEIN,UA: NEGATIVE

## 2021-09-07 NOTE — Progress Notes (Signed)
Routine Prenatal Care Visit  Subjective  Brenda Villegas is a 31 y.o. G3P2002 at 108w3d being seen today for ongoing prenatal care.  She is currently monitored for the following issues for this high-risk pregnancy and has Hypothyroidism; History of Hashimoto thyroiditis; Rh negative state in antepartum period; Supervision of high risk pregnancy, antepartum; Pap smear of cervix shows high risk HPV present; and Obesity affecting pregnancy in second trimester on their problem list.  ----------------------------------------------------------------------------------- Patient reports backache on lower right she thinks from sleeping too long on right side.   Contractions: Not present. Vag. Bleeding: None.  Movement: Present. Leaking Fluid denies.  ----------------------------------------------------------------------------------- The following portions of the patient's history were reviewed and updated as appropriate: allergies, current medications, past family history, past medical history, past social history, past surgical history and problem list. Problem list updated.  Objective  Blood pressure 120/80, weight 250 lb (113.4 kg), last menstrual period 02/27/2021, not currently breastfeeding. Pregravid weight 241 lb (109.3 kg) Total Weight Gain 9 lb (4.082 kg) Urinalysis: Urine Protein Negative  Urine Glucose Negative  Fetal Status: Fetal Heart Rate (bpm): 138 Fundal Height: 28 cm Movement: Present     General:  Alert, oriented and cooperative. Patient is in no acute distress.  Skin: Skin is warm and dry. No rash noted.   Cardiovascular: Normal heart rate noted  Respiratory: Normal respiratory effort, no problems with respiration noted  Abdomen: Soft, gravid, appropriate for gestational age. Pain/Pressure: Absent     Pelvic:  Cervical exam deferred        Extremities: Normal range of motion.     Mental Status: Normal mood and affect. Normal behavior. Normal judgment and thought content.    Assessment   31 y.o. G3P2002 at [redacted]w[redacted]d by  12/04/2021, by Last Menstrual Period presenting for routine prenatal visit  Plan   third Problems (from 04/15/21 to present)    Problem Noted Resolved   Supervision of high risk pregnancy, antepartum 04/15/2021 by Mirna Mires, CNM No   Overview Addendum 05/13/2021  5:46 PM by Mirna Mires, CNM     Nursing Staff Provider  Office Location  [facility] Dating  ordered  Language  English Anatomy US    Flu Vaccine   Genetic Screen  NIPS: neg, xx  TDaP vaccine    Hgb A1C or  GTT Early : Third trimester :   Covid    O  Rhogam   Blood Type negative  Feeding Plan  Antibody  neg  Contraception  Rubella  immune  Circumcision  RPR   neg  Pediatrician   HBsAg   neg  Support Person  HIV  neg  Prenatal Classes  Varicella immune    GBS  (For PCN allergy, check sensitivities)   BTL Consent     VBAC Consent  Pap  04/15/21    Hgb Electro      CF      SMA               History of Hashimoto thyroiditis 06/28/2019 by Vena Austria, MD No   Overview Addendum 05/08/2021 11:42 AM by Mirna Mires, CNM    TSH at [redacted] weeks gestation on 05/08/2021 is 7.7 Started on Levo .       Hypothyroidism 08/07/2014 by Margaretann Loveless, PA-C No       Preterm labor symptoms and general obstetric precautions including but not limited to vaginal bleeding, contractions, leaking of fluid and fetal movement were reviewed in detail with the patient.  Please refer to After Visit Summary for other counseling recommendations.   Return in about 2 weeks (around 09/21/2021) for rob.  Tresea Mall, CNM 09/07/2021 10:38 AM

## 2021-09-07 NOTE — Patient Instructions (Signed)

## 2021-09-08 ENCOUNTER — Other Ambulatory Visit: Payer: Self-pay

## 2021-09-08 DIAGNOSIS — O99212 Obesity complicating pregnancy, second trimester: Secondary | ICD-10-CM

## 2021-09-08 DIAGNOSIS — E063 Autoimmune thyroiditis: Secondary | ICD-10-CM

## 2021-09-08 DIAGNOSIS — O99213 Obesity complicating pregnancy, third trimester: Secondary | ICD-10-CM

## 2021-09-08 LAB — 28 WEEKS RH-PANEL
Antibody Screen: NEGATIVE
Basophils Absolute: 0 10*3/uL (ref 0.0–0.2)
Basos: 0 %
EOS (ABSOLUTE): 0.4 10*3/uL (ref 0.0–0.4)
Eos: 4 %
Gestational Diabetes Screen: 95 mg/dL (ref 70–139)
HIV Screen 4th Generation wRfx: NONREACTIVE
Hematocrit: 35.3 % (ref 34.0–46.6)
Hemoglobin: 11.7 g/dL (ref 11.1–15.9)
Immature Grans (Abs): 0 10*3/uL (ref 0.0–0.1)
Immature Granulocytes: 0 %
Lymphocytes Absolute: 1.9 10*3/uL (ref 0.7–3.1)
Lymphs: 20 %
MCH: 28 pg (ref 26.6–33.0)
MCHC: 33.1 g/dL (ref 31.5–35.7)
MCV: 84 fL (ref 79–97)
Monocytes Absolute: 0.5 10*3/uL (ref 0.1–0.9)
Monocytes: 5 %
Neutrophils Absolute: 6.8 10*3/uL (ref 1.4–7.0)
Neutrophils: 71 %
Platelets: 143 10*3/uL — ABNORMAL LOW (ref 150–450)
RBC: 4.18 x10E6/uL (ref 3.77–5.28)
RDW: 13.2 % (ref 11.7–15.4)
RPR Ser Ql: NONREACTIVE
WBC: 9.7 10*3/uL (ref 3.4–10.8)

## 2021-09-08 LAB — TSH: TSH: 1.03 u[IU]/mL (ref 0.450–4.500)

## 2021-09-10 ENCOUNTER — Ambulatory Visit: Payer: Medicaid Other | Attending: Obstetrics

## 2021-09-10 ENCOUNTER — Ambulatory Visit (HOSPITAL_BASED_OUTPATIENT_CLINIC_OR_DEPARTMENT_OTHER): Payer: Medicaid Other | Admitting: Obstetrics

## 2021-09-10 ENCOUNTER — Other Ambulatory Visit: Payer: Self-pay

## 2021-09-10 VITALS — BP 133/69 | HR 82 | Temp 98.0°F | Ht 68.0 in | Wt 249.0 lb

## 2021-09-10 DIAGNOSIS — E079 Disorder of thyroid, unspecified: Secondary | ICD-10-CM

## 2021-09-10 DIAGNOSIS — Z3A27 27 weeks gestation of pregnancy: Secondary | ICD-10-CM | POA: Diagnosis not present

## 2021-09-10 DIAGNOSIS — Z8639 Personal history of other endocrine, nutritional and metabolic disease: Secondary | ICD-10-CM

## 2021-09-10 DIAGNOSIS — E063 Autoimmune thyroiditis: Secondary | ICD-10-CM

## 2021-09-10 DIAGNOSIS — O99282 Endocrine, nutritional and metabolic diseases complicating pregnancy, second trimester: Secondary | ICD-10-CM | POA: Diagnosis not present

## 2021-09-10 DIAGNOSIS — O365920 Maternal care for other known or suspected poor fetal growth, second trimester, not applicable or unspecified: Secondary | ICD-10-CM

## 2021-09-10 DIAGNOSIS — E038 Other specified hypothyroidism: Secondary | ICD-10-CM

## 2021-09-10 DIAGNOSIS — O99213 Obesity complicating pregnancy, third trimester: Secondary | ICD-10-CM

## 2021-09-10 DIAGNOSIS — E669 Obesity, unspecified: Secondary | ICD-10-CM | POA: Insufficient documentation

## 2021-09-10 DIAGNOSIS — O0990 Supervision of high risk pregnancy, unspecified, unspecified trimester: Secondary | ICD-10-CM

## 2021-09-10 NOTE — Procedures (Signed)
Brenda Villegas [birth year] [redacted]w[redacted]d  Fetus A Non-Stress Test Interpretation for 09/10/21  Indication: IUGR  Fetal Heart Rate A Mode: External Baseline Rate (A): 145 bpm Variability: Moderate Accelerations: 15 x 15 Decelerations: None Multiple birth?: No  Uterine Activity Mode: Toco  Interpretation (Fetal Testing) Nonstress Test Interpretation: Reactive (Per Dr. Parke Poisson Remote)

## 2021-09-17 ENCOUNTER — Ambulatory Visit: Payer: Medicaid Other | Attending: Family Medicine

## 2021-09-17 ENCOUNTER — Ambulatory Visit: Payer: Medicaid Other | Admitting: *Deleted

## 2021-09-17 ENCOUNTER — Other Ambulatory Visit: Payer: Self-pay | Admitting: *Deleted

## 2021-09-17 VITALS — BP 124/72 | HR 87

## 2021-09-17 DIAGNOSIS — Z8639 Personal history of other endocrine, nutritional and metabolic disease: Secondary | ICD-10-CM | POA: Diagnosis present

## 2021-09-17 DIAGNOSIS — Z3689 Encounter for other specified antenatal screening: Secondary | ICD-10-CM

## 2021-09-17 DIAGNOSIS — E038 Other specified hypothyroidism: Secondary | ICD-10-CM | POA: Insufficient documentation

## 2021-09-17 DIAGNOSIS — O0990 Supervision of high risk pregnancy, unspecified, unspecified trimester: Secondary | ICD-10-CM

## 2021-09-17 DIAGNOSIS — O99213 Obesity complicating pregnancy, third trimester: Secondary | ICD-10-CM

## 2021-09-17 DIAGNOSIS — O99212 Obesity complicating pregnancy, second trimester: Secondary | ICD-10-CM | POA: Insufficient documentation

## 2021-09-17 DIAGNOSIS — E063 Autoimmune thyroiditis: Secondary | ICD-10-CM | POA: Diagnosis present

## 2021-09-17 DIAGNOSIS — O365930 Maternal care for other known or suspected poor fetal growth, third trimester, not applicable or unspecified: Secondary | ICD-10-CM

## 2021-09-17 DIAGNOSIS — E669 Obesity, unspecified: Secondary | ICD-10-CM

## 2021-09-17 DIAGNOSIS — O99283 Endocrine, nutritional and metabolic diseases complicating pregnancy, third trimester: Secondary | ICD-10-CM

## 2021-09-17 DIAGNOSIS — E079 Disorder of thyroid, unspecified: Secondary | ICD-10-CM

## 2021-09-17 DIAGNOSIS — Z3A28 28 weeks gestation of pregnancy: Secondary | ICD-10-CM

## 2021-09-21 ENCOUNTER — Encounter: Payer: Self-pay | Admitting: Obstetrics & Gynecology

## 2021-09-21 ENCOUNTER — Ambulatory Visit (INDEPENDENT_AMBULATORY_CARE_PROVIDER_SITE_OTHER): Payer: Medicaid Other | Admitting: Obstetrics & Gynecology

## 2021-09-21 VITALS — BP 128/66 | Wt 248.4 lb

## 2021-09-21 DIAGNOSIS — Z3A29 29 weeks gestation of pregnancy: Secondary | ICD-10-CM

## 2021-09-21 LAB — POCT URINALYSIS DIPSTICK OB: Glucose, UA: NEGATIVE

## 2021-10-05 ENCOUNTER — Encounter: Payer: Self-pay | Admitting: Licensed Practical Nurse

## 2021-10-05 ENCOUNTER — Ambulatory Visit (INDEPENDENT_AMBULATORY_CARE_PROVIDER_SITE_OTHER): Payer: Medicaid Other | Admitting: Licensed Practical Nurse

## 2021-10-05 VITALS — BP 110/60 | Wt 251.0 lb

## 2021-10-05 DIAGNOSIS — O0993 Supervision of high risk pregnancy, unspecified, third trimester: Secondary | ICD-10-CM

## 2021-10-05 DIAGNOSIS — O0990 Supervision of high risk pregnancy, unspecified, unspecified trimester: Secondary | ICD-10-CM

## 2021-10-05 DIAGNOSIS — R339 Retention of urine, unspecified: Secondary | ICD-10-CM

## 2021-10-05 NOTE — Progress Notes (Signed)
Routine Prenatal Care Visit  Subjective  Brenda Villegas is a 31 y.o. G3P2002 at [redacted]w[redacted]d being seen today for ongoing prenatal care.  She is currently monitored for the following issues for this high-risk pregnancy and has Hypothyroidism; History of Hashimoto thyroiditis; Rh negative state in antepartum period; Supervision of high risk pregnancy, antepartum; Pap smear of cervix shows high risk HPV present; and Obesity affecting pregnancy in second trimester on their problem list.  ----------------------------------------------------------------------------------- Patient reports  feels like she does not empty her bladder completely .  Here with her 2 sons.  Feeling good. Desires BTL, understands it will be an interval tubal.  -IUGR has follow up next week  -Is Rh negative, FOB if also Rh negative, reviewed that she does not need Rhogam, but we do need to see verification that the FOB is rh negative.  Contractions: Not present. Vag. Bleeding: None.  Movement: Present. Leaking Fluid denies.  ----------------------------------------------------------------------------------- The following portions of the patient's history were reviewed and updated as appropriate: allergies, current medications, past family history, past medical history, past social history, past surgical history and problem list. Problem list updated.  Objective  Blood pressure 110/60, weight 251 lb (113.9 kg), last menstrual period 02/27/2021, not currently breastfeeding. Pregravid weight 241 lb (109.3 kg) Total Weight Gain 10 lb (4.536 kg) Urinalysis: Urine Protein    Urine Glucose    Fetal Status: Fetal Heart Rate (bpm): 140 Fundal Height: 31 cm Movement: Present     General:  Alert, oriented and cooperative. Patient is in no acute distress.  Skin: Skin is warm and dry. No rash noted.   Cardiovascular: Normal heart rate noted  Respiratory: Normal respiratory effort, no problems with respiration noted  Abdomen: Soft, gravid,  appropriate for gestational age. Pain/Pressure: Absent     Pelvic:  Cervical exam deferred        Extremities: Normal range of motion.     Mental Status: Normal mood and affect. Normal behavior. Normal judgment and thought content.   Assessment   31 y.o. [id number] at [redacted]w[redacted]d by  12/04/2021, by Last Menstrual Period presenting for routine prenatal visit  Plan   third Problems (from 04/15/21 to present)     Problem Noted Resolved   Supervision of high risk pregnancy, antepartum 04/15/2021 by Mirna Mires, CNM No   Overview Addendum 05/13/2021  5:46 PM by Mirna Mires, CNM     Nursing Staff Provider  Office Location  [facility] Dating  ordered  Language  English Anatomy US    Flu Vaccine   Genetic Screen  NIPS: neg, xx  TDaP vaccine    Hgb A1C or  GTT Early : Third trimester :   Covid    O  Rhogam   Blood Type negative  Feeding Plan  Antibody  neg  Contraception  Rubella  immune  Circumcision  RPR   neg  Pediatrician   HBsAg   neg  Support Person  HIV  neg  Prenatal Classes  Varicella immune    GBS  (For PCN allergy, check sensitivities)   BTL Consent     VBAC Consent  Pap  04/15/21    Hgb Electro      CF      SMA               History of Hashimoto thyroiditis 06/28/2019 by Vena Austria, MD No   Overview Addendum 05/08/2021 11:42 AM by Mirna Mires, CNM    TSH at [redacted] weeks gestation on 05/08/2021  is 7.7 Started on Levo .       Hypothyroidism 08/07/2014 by Margaretann Loveless, PA-C No        Preterm labor symptoms and general obstetric precautions including but not limited to vaginal bleeding, contractions, leaking of fluid and fetal movement were reviewed in detail with the patient. Please refer to After Visit Summary for other counseling recommendations.   Return in about 2 years (around 10/06/2023) for ROB, NST IUGR .  TDAP next visit   Urine culture sent  Carie Caddy, CNM  Domingo Pulse, [facility]  10/05/21  1:12 PM

## 2021-10-05 NOTE — Progress Notes (Signed)
ROB- denies TDAP today, no concerns

## 2021-10-07 LAB — URINE CULTURE

## 2021-10-15 ENCOUNTER — Ambulatory Visit: Payer: Medicaid Other | Admitting: *Deleted

## 2021-10-15 ENCOUNTER — Ambulatory Visit: Payer: Medicaid Other | Attending: Maternal & Fetal Medicine

## 2021-10-15 VITALS — BP 118/66 | HR 87

## 2021-10-15 DIAGNOSIS — O99213 Obesity complicating pregnancy, third trimester: Secondary | ICD-10-CM | POA: Diagnosis not present

## 2021-10-15 DIAGNOSIS — O365930 Maternal care for other known or suspected poor fetal growth, third trimester, not applicable or unspecified: Secondary | ICD-10-CM | POA: Insufficient documentation

## 2021-10-15 DIAGNOSIS — O0990 Supervision of high risk pregnancy, unspecified, unspecified trimester: Secondary | ICD-10-CM | POA: Insufficient documentation

## 2021-10-15 DIAGNOSIS — Z8639 Personal history of other endocrine, nutritional and metabolic disease: Secondary | ICD-10-CM | POA: Diagnosis present

## 2021-10-15 DIAGNOSIS — E038 Other specified hypothyroidism: Secondary | ICD-10-CM | POA: Insufficient documentation

## 2021-10-15 DIAGNOSIS — Z3689 Encounter for other specified antenatal screening: Secondary | ICD-10-CM | POA: Insufficient documentation

## 2021-10-15 DIAGNOSIS — Z3A32 32 weeks gestation of pregnancy: Secondary | ICD-10-CM

## 2021-10-15 DIAGNOSIS — O99280 Endocrine, nutritional and metabolic diseases complicating pregnancy, unspecified trimester: Secondary | ICD-10-CM

## 2021-10-15 DIAGNOSIS — E063 Autoimmune thyroiditis: Secondary | ICD-10-CM | POA: Insufficient documentation

## 2021-10-15 DIAGNOSIS — E669 Obesity, unspecified: Secondary | ICD-10-CM

## 2021-10-16 ENCOUNTER — Other Ambulatory Visit: Payer: Self-pay | Admitting: *Deleted

## 2021-10-16 DIAGNOSIS — E079 Disorder of thyroid, unspecified: Secondary | ICD-10-CM

## 2021-10-19 ENCOUNTER — Encounter: Payer: Self-pay | Admitting: Licensed Practical Nurse

## 2021-10-19 ENCOUNTER — Other Ambulatory Visit: Payer: Medicaid Other

## 2021-10-19 ENCOUNTER — Ambulatory Visit (INDEPENDENT_AMBULATORY_CARE_PROVIDER_SITE_OTHER): Payer: Medicaid Other | Admitting: Licensed Practical Nurse

## 2021-10-19 VITALS — BP 130/80 | Wt 253.0 lb

## 2021-10-19 DIAGNOSIS — O0990 Supervision of high risk pregnancy, unspecified, unspecified trimester: Secondary | ICD-10-CM | POA: Diagnosis not present

## 2021-10-19 DIAGNOSIS — O365930 Maternal care for other known or suspected poor fetal growth, third trimester, not applicable or unspecified: Secondary | ICD-10-CM | POA: Diagnosis not present

## 2021-10-19 DIAGNOSIS — O99212 Obesity complicating pregnancy, second trimester: Secondary | ICD-10-CM

## 2021-10-19 DIAGNOSIS — Z3A33 33 weeks gestation of pregnancy: Secondary | ICD-10-CM

## 2021-10-19 LAB — POCT URINALYSIS DIPSTICK OB
Glucose, UA: NEGATIVE
POC,PROTEIN,UA: NEGATIVE

## 2021-10-19 NOTE — Progress Notes (Signed)
Routine Prenatal Care Visit  Subjective  Brenda Villegas is a 31 y.o. G3P2002 at [redacted]w[redacted]d being seen today for ongoing prenatal care.  She is currently monitored for the following issues for this high-risk pregnancy and has Hypothyroidism; History of Hashimoto thyroiditis; Rh negative state in antepartum period; Supervision of high risk pregnancy, antepartum; Pap smear of cervix shows high risk HPV present; and Obesity affecting pregnancy in second trimester on their problem list.  ----------------------------------------------------------------------------------- Patient reports  has pain just below her ankles and along her feet when standing or walking for long periods of time.  Marland Kitchen  Here with her two sons.  -Had growth [place] on 9/7 EFW 56% no longer IUGR, has repeat at 37 weeks  Contractions: Irregular. Vag. Bleeding: None.  Movement: Present. Leaking Fluid denies.  ----------------------------------------------------------------------------------- The following portions of the patient's history were reviewed and updated as appropriate: allergies, current medications, past family history, past medical history, past social history, past surgical history and problem list. Problem list updated.  Objective  Blood pressure 130/80, weight 253 lb (114.8 kg), last menstrual period 02/27/2021, not currently breastfeeding. Pregravid weight 241 lb (109.3 kg) Total Weight Gain 12 lb (5.443 kg) Urinalysis: Urine Protein    Urine Glucose    Fetal Status: Fetal Heart Rate (bpm): RNST   Movement: Present  Presentation: Vertex Baseline 140, moderate variability, pos accel, neg decel Toco: no contractions  General:  Alert, oriented and cooperative. Patient is in no acute distress.  Skin: Skin is warm and dry. No rash noted.   Cardiovascular: Normal heart rate noted  Respiratory: Normal respiratory effort, no problems with respiration noted  Abdomen: Soft, gravid, appropriate for gestational age. Pain/Pressure:  Absent     Pelvic:  Cervical exam deferred        Extremities: Normal range of motion.  Edema: None  Mental Status: Normal mood and affect. Normal behavior. Normal judgment and thought content.   Assessment   31 y.o. [id number] at [redacted]w[redacted]d by  12/04/2021, by Last Menstrual Period presenting for routine prenatal visit  Plan   third Problems (from 04/15/21 to present)     Problem Noted Resolved   Supervision of high risk pregnancy, antepartum 04/15/2021 by Mirna Mires, CNM No   Overview Addendum 10/05/2021  1:13 PM by Ellwood Sayers, CNM     Nursing Staff Provider  Office Location  [facility] Dating  ordered  Language  English Anatomy US    Flu Vaccine   Genetic Screen  NIPS: neg, xx  TDaP vaccine    Hgb A1C or  GTT Early : Third trimester :   Covid    O  Rhogam  FOB rh neg Blood Type negative  Feeding Plan Breast Antibody  neg  Contraception BTL Rubella  immune  Circumcision NA RPR   neg  Pediatrician   HBsAg   neg  Support Person  HIV  neg  Prenatal Classes  Varicella immune    GBS  (For PCN allergy, check sensitivities)   BTL Consent     VBAC Consent  Pap  04/15/21    Hgb Electro      CF      SMA               History of Hashimoto thyroiditis 06/28/2019 by Vena Austria, MD No   Overview Addendum 05/08/2021 11:42 AM by Mirna Mires, CNM    TSH at [redacted] weeks gestation on 05/08/2021 is 7.7 Started on Levo .  Hypothyroidism 08/07/2014 by Margaretann Loveless, PA-C No        Preterm labor symptoms and general obstetric precautions including but not limited to vaginal bleeding, contractions, leaking of fluid and fetal movement were reviewed in detail with the patient. Please refer to After Visit Summary for other counseling recommendations.   Return in about 2 weeks (around 11/02/2021) for ROB.  Carie Caddy, CNM  Domingo Pulse, [facility]  10/19/21  11:07 AM

## 2021-11-02 ENCOUNTER — Ambulatory Visit (INDEPENDENT_AMBULATORY_CARE_PROVIDER_SITE_OTHER): Payer: Medicaid Other | Admitting: Obstetrics & Gynecology

## 2021-11-02 VITALS — BP 102/72 | HR 86 | Wt 251.6 lb

## 2021-11-02 DIAGNOSIS — Z3A35 35 weeks gestation of pregnancy: Secondary | ICD-10-CM

## 2021-11-02 NOTE — Progress Notes (Signed)
Subjective:    Brenda Villegas is a 31 y.o. female being seen today for her obstetrical visit. She is at [redacted]w[redacted]d gestation. Patient reports no bleeding, no contractions, no cramping, and no leaking. Fetal movement: normal.  Menstrual History: OB History     Gravida  3   Para  2   Term  2   Preterm      AB      Living  2      SAB      IAB      Ectopic      Multiple  0   Live Births  2            Patient's last menstrual period was 02/27/2021 (exact date).    The following portions of the patient's history were reviewed and updated as appropriate: allergies, current medications, past family history, past medical history, past social history, past surgical history, and problem list.  Review of Systems A comprehensive review of systems was negative.   Objective:    BP 102/72   Pulse 86   Wt 251 lb 9.6 oz (114.1 kg)   LMP 02/27/2021 (Exact Date)   BMI 38.26 kg/m  FHT:  140 BPM  Uterine Size: 36 cm  Presentation: cephalic     Assessment:    Pregnancy 35 and 3/7 weeks   Plan:    28-week labs reviewed, normal GBS to be done at next visit Follow up in 1 Week.

## 2021-11-11 ENCOUNTER — Encounter: Payer: Self-pay | Admitting: Licensed Practical Nurse

## 2021-11-11 ENCOUNTER — Other Ambulatory Visit (HOSPITAL_COMMUNITY)
Admission: RE | Admit: 2021-11-11 | Discharge: 2021-11-11 | Disposition: A | Discharge status: Home / Self Care | Payer: Medicaid Other | Source: Ambulatory Visit | Attending: Licensed Practical Nurse | Admitting: Licensed Practical Nurse

## 2021-11-11 ENCOUNTER — Ambulatory Visit (INDEPENDENT_AMBULATORY_CARE_PROVIDER_SITE_OTHER): Payer: Medicaid Other | Admitting: Licensed Practical Nurse

## 2021-11-11 VITALS — BP 121/58 | HR 80 | Wt 249.0 lb

## 2021-11-11 DIAGNOSIS — E039 Hypothyroidism, unspecified: Secondary | ICD-10-CM

## 2021-11-11 DIAGNOSIS — Z23 Encounter for immunization: Secondary | ICD-10-CM

## 2021-11-11 DIAGNOSIS — Z3685 Encounter for antenatal screening for Streptococcus B: Secondary | ICD-10-CM

## 2021-11-11 DIAGNOSIS — Z3A36 36 weeks gestation of pregnancy: Secondary | ICD-10-CM

## 2021-11-11 DIAGNOSIS — O09293 Supervision of pregnancy with other poor reproductive or obstetric history, third trimester: Secondary | ICD-10-CM

## 2021-11-11 DIAGNOSIS — Z113 Encounter for screening for infections with a predominantly sexual mode of transmission: Secondary | ICD-10-CM | POA: Diagnosis present

## 2021-11-11 DIAGNOSIS — E669 Obesity, unspecified: Secondary | ICD-10-CM

## 2021-11-11 DIAGNOSIS — O0990 Supervision of high risk pregnancy, unspecified, unspecified trimester: Secondary | ICD-10-CM | POA: Diagnosis present

## 2021-11-11 DIAGNOSIS — O99213 Obesity complicating pregnancy, third trimester: Secondary | ICD-10-CM

## 2021-11-11 DIAGNOSIS — Z8639 Personal history of other endocrine, nutritional and metabolic disease: Secondary | ICD-10-CM

## 2021-11-11 DIAGNOSIS — O99283 Endocrine, nutritional and metabolic diseases complicating pregnancy, third trimester: Secondary | ICD-10-CM

## 2021-11-11 LAB — POCT URINALYSIS DIPSTICK
Glucose, UA: NEGATIVE
Leukocytes, UA: NEGATIVE
Protein, UA: NEGATIVE
Spec Grav, UA: 1.01 (ref 1.010–1.025)
pH, UA: 5.5 (ref 5.0–8.0)

## 2021-11-11 NOTE — Progress Notes (Signed)
Routine Prenatal Care Visit  Subjective  Brenda Villegas is a 31 y.o. G3P2002 at [redacted]w[redacted]d being seen today for ongoing prenatal care.  She is currently monitored for the following issues for this high-risk pregnancy and has Hypothyroidism; History of Hashimoto thyroiditis; Rh negative state in antepartum period; Supervision of high risk pregnancy, antepartum; Pap smear of cervix shows high risk HPV present; and Obesity affecting pregnancy in second trimester on their problem list.  ----------------------------------------------------------------------------------- Patient reports Here with son. Doing ok, feels "more emotional" lately, denies concern for depression. Her husband will be around for a while PP, and her mother will come to help out.  Reviewed plan for colop PP. Contractions: Not present. Vag. Bleeding: None.  Movement: Present. Leaking Fluid denies.  ----------------------------------------------------------------------------------- The following portions of the patient's history were reviewed and updated as appropriate: allergies, current medications, past family history, past medical history, past social history, past surgical history and problem list. Problem list updated.  Objective  Blood pressure (!) 121/58, pulse 80, weight 249 lb (112.9 kg), last menstrual period 02/27/2021, not currently breastfeeding. Pregravid weight 241 lb (109.3 kg) Total Weight Gain 8 lb (3.629 kg) Urinalysis: Urine Protein    Urine Glucose    Fetal Status: Fetal Heart Rate (bpm): 130 Fundal Height: 36 cm Movement: Present     General:  Alert, oriented and cooperative. Patient is in no acute distress.  Skin: Skin is warm and dry. No rash noted.   Cardiovascular: Normal heart rate noted  Respiratory: Normal respiratory effort, no problems with respiration noted  Abdomen: Soft, gravid, appropriate for gestational age. Pain/Pressure: Present     Pelvic:  Cervical exam deferred        Extremities:  Normal range of motion.  Edema: None  Mental Status: Normal mood and affect. Normal behavior. Normal judgment and thought content.   Assessment   31 y.o. [id number] at [redacted]w[redacted]d by  12/04/2021, by Last Menstrual Period presenting for routine prenatal visit  Plan   third Problems (from 04/15/21 to present)     Problem Noted Resolved   Supervision of high risk pregnancy, antepartum 04/15/2021 by Imagene Riches, CNM No   Overview Addendum 11/11/2021 11:04 AM by Allen Derry, [place] Staff Provider  Office Location  [facility] Dating  [redacted]w[redacted]d on 3/30   Language  English Anatomy US  Normal, IUGR-resolved   Flu Vaccine   Genetic Screen  NIPS: neg, xx  TDaP vaccine   11/11/2021 Hgb A1C or  GTT Early : Third trimester :   Covid    O  Rhogam  FOB rh neg Blood Type negative  Feeding Plan Breast Antibody  neg  Contraception BTL Rubella  immune  Circumcision NA RPR   neg  Pediatrician  [facility], Dr Bobby Rumpf  HBsAg   neg  Support Person Josh  HIV  neg  Prenatal Classes multip  Varicella immune    GBS  (For PCN allergy, check sensitivities)   BTL Consent     VBAC Consent  Pap  04/15/21    Hgb Electro      CF      SMA               History of Hashimoto thyroiditis 06/28/2019 by Malachy Mood, MD No   Overview Addendum 05/08/2021 11:42 AM by Imagene Riches, CNM    TSH at [redacted] weeks gestation on 05/08/2021 is 7.7 Started on Levo 161mcg.       Hypothyroidism 08/07/2014 by  Fenton Malling M, PA-C No        Preterm labor symptoms and general obstetric precautions including but not limited to vaginal bleeding, contractions, leaking of fluid and fetal movement were reviewed in detail with the patient. Please refer to After Visit Summary for other counseling recommendations.   Return in about 1 week (around 11/18/2021) for ROB.  TDAp given today 36 wks labs collected Has growth [place] scheduled   Roberto Scales, [place]  11/11/21   11:08 AM

## 2021-11-12 ENCOUNTER — Other Ambulatory Visit: Payer: Self-pay

## 2021-11-12 ENCOUNTER — Encounter: Payer: Self-pay | Admitting: Licensed Practical Nurse

## 2021-11-12 DIAGNOSIS — E063 Autoimmune thyroiditis: Secondary | ICD-10-CM

## 2021-11-12 DIAGNOSIS — O99213 Obesity complicating pregnancy, third trimester: Secondary | ICD-10-CM

## 2021-11-12 LAB — CERVICOVAGINAL ANCILLARY ONLY
Chlamydia: NEGATIVE
Comment: NEGATIVE
Comment: NORMAL
Neisseria Gonorrhea: NEGATIVE

## 2021-11-15 LAB — STREP GP B CULTURE+RFLX: Strep Gp B Culture+Rflx: NEGATIVE

## 2021-11-17 ENCOUNTER — Other Ambulatory Visit: Payer: Self-pay

## 2021-11-17 ENCOUNTER — Ambulatory Visit: Payer: Medicaid Other | Attending: Maternal & Fetal Medicine

## 2021-11-17 VITALS — BP 131/60 | HR 75 | Temp 97.7°F | Ht 68.0 in | Wt 252.5 lb

## 2021-11-17 DIAGNOSIS — O99283 Endocrine, nutritional and metabolic diseases complicating pregnancy, third trimester: Secondary | ICD-10-CM | POA: Diagnosis not present

## 2021-11-17 DIAGNOSIS — E669 Obesity, unspecified: Secondary | ICD-10-CM

## 2021-11-17 DIAGNOSIS — O99213 Obesity complicating pregnancy, third trimester: Secondary | ICD-10-CM | POA: Diagnosis not present

## 2021-11-17 DIAGNOSIS — Z8639 Personal history of other endocrine, nutritional and metabolic disease: Secondary | ICD-10-CM

## 2021-11-17 DIAGNOSIS — E063 Autoimmune thyroiditis: Secondary | ICD-10-CM

## 2021-11-17 DIAGNOSIS — Z3A37 37 weeks gestation of pregnancy: Secondary | ICD-10-CM | POA: Diagnosis present

## 2021-11-17 DIAGNOSIS — O0990 Supervision of high risk pregnancy, unspecified, unspecified trimester: Secondary | ICD-10-CM

## 2021-11-18 ENCOUNTER — Ambulatory Visit (INDEPENDENT_AMBULATORY_CARE_PROVIDER_SITE_OTHER): Payer: Medicaid Other | Admitting: Obstetrics and Gynecology

## 2021-11-18 ENCOUNTER — Encounter: Payer: Self-pay | Admitting: Obstetrics and Gynecology

## 2021-11-18 VITALS — BP 125/83 | HR 64 | Wt 253.7 lb

## 2021-11-18 DIAGNOSIS — O0990 Supervision of high risk pregnancy, unspecified, unspecified trimester: Secondary | ICD-10-CM

## 2021-11-18 DIAGNOSIS — O0993 Supervision of high risk pregnancy, unspecified, third trimester: Secondary | ICD-10-CM

## 2021-11-18 DIAGNOSIS — Z3A37 37 weeks gestation of pregnancy: Secondary | ICD-10-CM

## 2021-11-18 LAB — POCT URINALYSIS DIPSTICK OB
Bilirubin, UA: NEGATIVE
Blood, UA: NEGATIVE
Glucose, UA: NEGATIVE
Ketones, UA: NEGATIVE
Nitrite, UA: NEGATIVE
Spec Grav, UA: 1.015 (ref 1.010–1.025)
Urobilinogen, UA: 0.2 E.U./dL
pH, UA: 6.5 (ref 5.0–8.0)

## 2021-11-18 NOTE — Progress Notes (Signed)
ROB:  No complaints.  Irregular contractions.  Recent ultrasound shows 68 percentile for growth.  BMI remains less than 40.  Plan on spontaneous labor with term delivery.

## 2021-11-18 NOTE — Progress Notes (Signed)
ROB. Patient states daily fetal movement with lots of pressure along with braxton hicks and regular contractions. She states questions regarding IOL after recent ultrasound. Patient states no questions or concerns at this time.

## 2021-11-23 ENCOUNTER — Encounter: Payer: Self-pay | Admitting: Certified Nurse Midwife

## 2021-11-23 ENCOUNTER — Ambulatory Visit (INDEPENDENT_AMBULATORY_CARE_PROVIDER_SITE_OTHER): Payer: Medicaid Other | Admitting: Certified Nurse Midwife

## 2021-11-23 VITALS — BP 113/77 | HR 62 | Wt 253.2 lb

## 2021-11-23 DIAGNOSIS — Z3483 Encounter for supervision of other normal pregnancy, third trimester: Secondary | ICD-10-CM

## 2021-11-23 DIAGNOSIS — Z3A38 38 weeks gestation of pregnancy: Secondary | ICD-10-CM

## 2021-11-23 DIAGNOSIS — R82998 Other abnormal findings in urine: Secondary | ICD-10-CM

## 2021-11-23 LAB — POCT URINALYSIS DIPSTICK OB
Bilirubin, UA: NEGATIVE
Blood, UA: NEGATIVE
Glucose, UA: NEGATIVE
Ketones, UA: NEGATIVE
Nitrite, UA: NEGATIVE
POC,PROTEIN,UA: NEGATIVE
Spec Grav, UA: 1.02 (ref 1.010–1.025)
Urobilinogen, UA: 0.2 E.U./dL
pH, UA: 6 (ref 5.0–8.0)

## 2021-11-23 NOTE — Patient Instructions (Signed)
Braxton Hicks Contractions  Contractions of the uterus can occur throughout pregnancy, but they are not always a sign that you are in labor. You may have practice contractions called Braxton Hicks contractions. These false labor contractions are sometimes confused with true labor. What are Braxton Hicks contractions? Braxton Hicks contractions are tightening movements that occur in the muscles of the uterus before labor. Unlike true labor contractions, these contractions do not result in opening (dilation) and thinning of the lowest part of the uterus (cervix). Toward the end of pregnancy (32-34 weeks), Braxton Hicks contractions can happen more often and may become stronger. These contractions are sometimes difficult to tell apart from true labor because they can be very uncomfortable. How to tell the difference between true labor and false labor True labor Contractions last 30-70 seconds. Contractions become very regular. Discomfort is usually felt in the top of the uterus, and it spreads to the lower abdomen and low back. Contractions do not go away with walking. Contractions usually become stronger and more frequent. The cervix dilates and gets thinner. False labor Contractions are usually shorter, weaker, and farther apart than true labor contractions. Contractions are usually irregular. Contractions are often felt in the front of the lower abdomen and in the groin. Contractions may go away when you walk around or change positions while lying down. The cervix usually does not dilate or become thin. Sometimes, the only way to tell if you are in true labor is for your health care provider to look for changes in your cervix. Your health care provider will do a physical exam and may monitor your contractions. If you are in true labor, your health care provider will send you home with instructions about when to return to the hospital. You may continue to have Braxton Hicks contractions until you  go into true labor. Follow these instructions at home:  Take over-the-counter and prescription medicines only as told by your health care provider. If Braxton Hicks contractions are making you uncomfortable: Change your position from lying down or resting to walking, or change from walking to resting. Sit and rest in a tub of warm water. Drink enough fluid to keep your urine pale yellow. Dehydration may cause these contractions. Do slow and deep breathing several times an hour. Keep all follow-up visits. This is important. Contact a health care provider if: You have a fever. You have continuous pain in your abdomen. Your contractions become stronger, more regular, and closer together. You pass blood-tinged mucus. Get help right away if: You have fluid leaking or gushing from your vagina. You have bright red blood coming from your vagina. Your baby is not moving inside you as much as it used to. Summary You may have practice contractions called Braxton Hicks contractions. These false labor contractions are sometimes confused with true labor. Braxton Hicks contractions are usually shorter, weaker, farther apart, and less regular than true labor contractions. True labor contractions usually become stronger, more regular, and more frequent. Manage discomfort from Braxton Hicks contractions by changing position, resting in a warm bath, practicing deep breathing, and drinking plenty of water. Keep all follow-up visits. Contact your health care provider if your contractions become stronger, more regular, and closer together. This information is not intended to replace advice given to you by your health care provider. Make sure you discuss any questions you have with your health care provider. Document Revised: 12/03/2019 Document Reviewed: 12/03/2019 Elsevier Patient Education  2023 [employer]  

## 2021-11-23 NOTE — Progress Notes (Signed)
ROB doing well, feeling good movement , lots of pressure in her thighs and lightening crotch. Request to be checked today, tight 2/60/-2 . Labor precautions reviewed. Ask about induction at 40 wks. Discussed we could work on scheduling 40-41st wk. She verbalizes and agrees. Follow up 1 wks.   Philip Aspen, CNM

## 2021-11-23 NOTE — Addendum Note (Signed)
Addended by: Minette Headland on: 11/23/2021 09:09 AM   Modules accepted: Orders

## 2021-11-25 LAB — URINE CULTURE

## 2021-11-28 ENCOUNTER — Encounter: Payer: Self-pay | Admitting: Certified Nurse Midwife

## 2021-11-28 ENCOUNTER — Other Ambulatory Visit: Payer: Self-pay | Admitting: Certified Nurse Midwife

## 2021-11-28 MED ORDER — NITROFURANTOIN MONOHYD MACRO 100 MG PO CAPS: 100.0000 mg | ORAL_CAPSULE | Freq: Two times a day (BID) | ORAL | 0 refills | Status: AC

## 2021-11-30 ENCOUNTER — Ambulatory Visit (INDEPENDENT_AMBULATORY_CARE_PROVIDER_SITE_OTHER): Payer: Medicaid Other | Admitting: Licensed Practical Nurse

## 2021-11-30 ENCOUNTER — Inpatient Hospital Stay
Admission: EM | Admit: 2021-11-30 | Discharge: 2021-12-02 | DRG: 807 | Disposition: A | Discharge status: Home / Self Care | Payer: Medicaid Other | Attending: Certified Nurse Midwife | Admitting: Certified Nurse Midwife

## 2021-11-30 ENCOUNTER — Other Ambulatory Visit: Payer: Self-pay

## 2021-11-30 ENCOUNTER — Encounter: Payer: Self-pay | Admitting: Licensed Practical Nurse

## 2021-11-30 ENCOUNTER — Encounter: Payer: Self-pay | Admitting: Obstetrics and Gynecology

## 2021-11-30 VITALS — BP 121/76 | HR 66 | Wt 252.0 lb

## 2021-11-30 DIAGNOSIS — O99284 Endocrine, nutritional and metabolic diseases complicating childbirth: Secondary | ICD-10-CM | POA: Diagnosis present

## 2021-11-30 DIAGNOSIS — O0990 Supervision of high risk pregnancy, unspecified, unspecified trimester: Principal | ICD-10-CM

## 2021-11-30 DIAGNOSIS — O26893 Other specified pregnancy related conditions, third trimester: Secondary | ICD-10-CM | POA: Diagnosis present

## 2021-11-30 DIAGNOSIS — O360130 Maternal care for anti-D [Rh] antibodies, third trimester, not applicable or unspecified: Secondary | ICD-10-CM

## 2021-11-30 DIAGNOSIS — Z6791 Unspecified blood type, Rh negative: Secondary | ICD-10-CM | POA: Diagnosis not present

## 2021-11-30 DIAGNOSIS — O09293 Supervision of pregnancy with other poor reproductive or obstetric history, third trimester: Secondary | ICD-10-CM

## 2021-11-30 DIAGNOSIS — Z3490 Encounter for supervision of normal pregnancy, unspecified, unspecified trimester: Secondary | ICD-10-CM

## 2021-11-30 DIAGNOSIS — E039 Hypothyroidism, unspecified: Secondary | ICD-10-CM | POA: Diagnosis present

## 2021-11-30 DIAGNOSIS — O99283 Endocrine, nutritional and metabolic diseases complicating pregnancy, third trimester: Secondary | ICD-10-CM

## 2021-11-30 DIAGNOSIS — O99214 Obesity complicating childbirth: Secondary | ICD-10-CM | POA: Diagnosis not present

## 2021-11-30 DIAGNOSIS — Z3A39 39 weeks gestation of pregnancy: Secondary | ICD-10-CM

## 2021-11-30 DIAGNOSIS — O26899 Other specified pregnancy related conditions, unspecified trimester: Secondary | ICD-10-CM

## 2021-11-30 DIAGNOSIS — E063 Autoimmune thyroiditis: Secondary | ICD-10-CM | POA: Diagnosis present

## 2021-11-30 DIAGNOSIS — E669 Obesity, unspecified: Secondary | ICD-10-CM

## 2021-11-30 DIAGNOSIS — Z8639 Personal history of other endocrine, nutritional and metabolic disease: Secondary | ICD-10-CM

## 2021-11-30 DIAGNOSIS — Z87891 Personal history of nicotine dependence: Secondary | ICD-10-CM

## 2021-11-30 DIAGNOSIS — E038 Other specified hypothyroidism: Secondary | ICD-10-CM

## 2021-11-30 LAB — CBC
HCT: 36.3 % (ref 36.0–46.0)
Hemoglobin: 12 g/dL (ref 12.0–15.0)
MCH: 25.8 pg — ABNORMAL LOW (ref 26.0–34.0)
MCHC: 33.1 g/dL (ref 30.0–36.0)
MCV: 77.9 fL — ABNORMAL LOW (ref 80.0–100.0)
Platelets: 144 10*3/uL — ABNORMAL LOW (ref 150–400)
RBC: 4.66 MIL/uL (ref 3.87–5.11)
RDW: 14.6 % (ref 11.5–15.5)
WBC: 12.9 10*3/uL — ABNORMAL HIGH (ref 4.0–10.5)
nRBC: 0 % (ref 0.0–0.2)

## 2021-11-30 LAB — POCT URINALYSIS DIPSTICK OB
Blood, UA: NEGATIVE
Glucose, UA: NEGATIVE
Ketones, UA: NEGATIVE
Leukocytes, UA: NEGATIVE
POC,PROTEIN,UA: NEGATIVE
Spec Grav, UA: 1.015 (ref 1.010–1.025)
Urobilinogen, UA: 1 E.U./dL
pH, UA: 6 (ref 5.0–8.0)

## 2021-11-30 MED ORDER — LIDOCAINE HCL (PF) 1 % IJ SOLN
30.0000 mL | INTRAMUSCULAR | Status: DC | PRN
  Filled 2021-11-30: qty 30

## 2021-11-30 MED ORDER — LACTATED RINGERS IV SOLN: 500.0000 mL | INTRAVENOUS | Status: DC | PRN

## 2021-11-30 MED ORDER — SIMETHICONE 80 MG PO CHEW: 80.0000 mg | CHEWABLE_TABLET | ORAL | Status: DC | PRN

## 2021-11-30 MED ORDER — ACETAMINOPHEN 325 MG PO TABS: 650.0000 mg | ORAL_TABLET | ORAL | Status: DC | PRN

## 2021-11-30 MED ORDER — OXYTOCIN-SODIUM CHLORIDE 30-0.9 UT/500ML-% IV SOLN
2.5000 [IU]/h | INTRAVENOUS | Status: DC
  Administered 2021-11-30: 2.5 [IU]/h via INTRAVENOUS

## 2021-11-30 MED ORDER — COCONUT OIL OIL: 1.0000 | TOPICAL_OIL | Status: DC | PRN

## 2021-11-30 MED ORDER — WITCH HAZEL-GLYCERIN EX PADS: 1.0000 | MEDICATED_PAD | CUTANEOUS | Status: DC | PRN

## 2021-11-30 MED ORDER — DIPHENHYDRAMINE HCL 25 MG PO CAPS: 25.0000 mg | ORAL_CAPSULE | Freq: Four times a day (QID) | ORAL | Status: DC | PRN

## 2021-11-30 MED ORDER — BENZOCAINE-MENTHOL 20-0.5 % EX AERO: 1.0000 | INHALATION_SPRAY | CUTANEOUS | Status: DC | PRN

## 2021-11-30 MED ORDER — BENZOCAINE-MENTHOL 20-0.5 % EX AERO
INHALATION_SPRAY | CUTANEOUS | Status: AC
  Filled 2021-11-30: qty 56

## 2021-11-30 MED ORDER — TERBUTALINE SULFATE 1 MG/ML IJ SOLN: 0.2500 mg | Freq: Once | INTRAMUSCULAR | Status: DC | PRN

## 2021-11-30 MED ORDER — MISOPROSTOL 200 MCG PO TABS
ORAL_TABLET | ORAL | Status: AC
  Filled 2021-11-30: qty 4

## 2021-11-30 MED ORDER — SOD CITRATE-CITRIC ACID 500-334 MG/5ML PO SOLN: 30.0000 mL | ORAL | Status: DC | PRN

## 2021-11-30 MED ORDER — TETANUS-DIPHTH-ACELL PERTUSSIS 5-2.5-18.5 LF-MCG/0.5 IM SUSY: 0.5000 mL | PREFILLED_SYRINGE | Freq: Once | INTRAMUSCULAR | Status: DC

## 2021-11-30 MED ORDER — IBUPROFEN 600 MG PO TABS
600.0000 mg | ORAL_TABLET | Freq: Four times a day (QID) | ORAL | Status: DC
  Administered 2021-11-30 – 2021-12-02 (×7): 600 mg via ORAL
  Filled 2021-11-30 (×7): qty 1

## 2021-11-30 MED ORDER — DIBUCAINE (PERIANAL) 1 % EX OINT: 1.0000 | TOPICAL_OINTMENT | CUTANEOUS | Status: DC | PRN

## 2021-11-30 MED ORDER — SENNOSIDES-DOCUSATE SODIUM 8.6-50 MG PO TABS
2.0000 | ORAL_TABLET | Freq: Every day | ORAL | Status: DC
  Administered 2021-12-01 – 2021-12-02 (×2): 2 via ORAL
  Filled 2021-11-30 (×2): qty 2

## 2021-11-30 MED ORDER — AMMONIA AROMATIC IN INHA
RESPIRATORY_TRACT | Status: AC
  Filled 2021-11-30: qty 10

## 2021-11-30 MED ORDER — ONDANSETRON HCL 4 MG PO TABS: 4.0000 mg | ORAL_TABLET | ORAL | Status: DC | PRN

## 2021-11-30 MED ORDER — OXYTOCIN BOLUS FROM INFUSION
333.0000 mL | Freq: Once | INTRAVENOUS | Status: AC
  Administered 2021-11-30: 333 mL via INTRAVENOUS

## 2021-11-30 MED ORDER — OXYTOCIN-SODIUM CHLORIDE 30-0.9 UT/500ML-% IV SOLN
1.0000 m[IU]/min | INTRAVENOUS | Status: DC
  Filled 2021-11-30: qty 500

## 2021-11-30 MED ORDER — LEVOTHYROXINE SODIUM 50 MCG PO TABS
50.0000 ug | ORAL_TABLET | Freq: Every day | ORAL | Status: DC
  Administered 2021-12-01 – 2021-12-02 (×2): 50 ug via ORAL
  Filled 2021-11-30 (×2): qty 1

## 2021-11-30 MED ORDER — PRENATAL MULTIVITAMIN CH
1.0000 | ORAL_TABLET | Freq: Every day | ORAL | Status: DC
  Administered 2021-12-01 – 2021-12-02 (×2): 1 via ORAL
  Filled 2021-11-30 (×2): qty 1

## 2021-11-30 MED ORDER — ONDANSETRON HCL 4 MG/2ML IJ SOLN: 4.0000 mg | Freq: Four times a day (QID) | INTRAMUSCULAR | Status: DC | PRN

## 2021-11-30 MED ORDER — OXYTOCIN 10 UNIT/ML IJ SOLN
INTRAMUSCULAR | Status: AC
  Filled 2021-11-30: qty 2

## 2021-11-30 MED ORDER — LACTATED RINGERS IV SOLN: INTRAVENOUS | Status: DC

## 2021-11-30 MED ORDER — ONDANSETRON HCL 4 MG/2ML IJ SOLN: 4.0000 mg | INTRAMUSCULAR | Status: DC | PRN

## 2021-11-30 NOTE — Discharge Summary (Signed)
OB Discharge Summary     Patient Name: Brenda Villegas DOB: 02/28/1990 MRN: [id number]  Date of admission: 11/30/2021 Delivering MD: Harlin Heys Date of Delivery: 11/30/2021  Date of discharge: 12/02/2021  Admitting diagnosis: Encounter for elective induction of labor [Z34.90] Intrauterine pregnancy: [redacted]w[redacted]d     Secondary diagnosis: None     Discharge diagnosis: Term Pregnancy Delivered                                                                                                Post partum procedures: rhogam not indicated baby O negative  Augmentation: N/A  Complications: None  Hospital course:  Onset of Labor With Vaginal Delivery      31 y.o. yo [id number] at [redacted]w[redacted]d was admitted in Active Labor on 11/30/2021.  Labor course was complicated by quick labor. Delivered 4 hours after contractions started.  Membrane Rupture Time/Date: 6:10 PM ,11/30/2021   Delivery Method:Vaginal, Spontaneous  Episiotomy: None  Lacerations:  None  See delivery note for details  Patient had a postpartum course has been uncomplicated. She is tolerating regular diet, her pain is controlled with PO medication, she is ambulating and voiding without difficulty.   Patient is discharged home in stable condition on 12/02/21.  Newborn Data: Birth date:11/30/2021  Birth time:6:27 PM  Gender:Female   [place] Living status:Living  Apgars:8 ,9  PJKDTO:6712 g   Physical exam  Vitals:   12/01/21 0415 12/01/21 0805 12/01/21 1552 12/01/21 2340  BP: 126/68 120/75 127/78 123/84  Pulse: 74 75 76 81  Resp: 18 19 18 18   Temp: 98.6 F (37 C) 98.4 F (36.9 C) 98.5 F (36.9 C) 98.2 F (36.8 C)  TempSrc: Oral Oral Oral Oral  SpO2: 99% 100%  99%  Weight:      Height:       General: alert, cooperative, and no distress Lochia: appropriate Uterine Fundus: firm @u -1 Incision: N/A DVT Evaluation: No evidence of DVT seen on physical exam. No cords or calf tenderness. No significant calf/ankle  edema.  Labs: Lab Results  Component Value Date   WBC 11.3 (H) 12/01/2021   HGB 9.9 (L) 12/01/2021   HCT 30.0 (L) 12/01/2021   MCV 79.4 (L) 12/01/2021   PLT 125 (L) 12/01/2021    Discharge instruction: per After Visit Summary.  Medications:  Allergies as of 12/02/2021       Reactions   Penicillins Hives        Medication List     STOP taking these medications    aspirin 81 MG chewable tablet       TAKE these medications    levothyroxine 50 MCG tablet Commonly known as: Synthroid Take 1 tablet (50 mcg total) by mouth daily before breakfast.   multivitamin-prenatal 27-0.8 MG Tabs tablet Take 1 tablet by mouth daily at 12 noon.   nitrofurantoin (macrocrystal-monohydrate) 100 MG capsule Commonly known as: Macrobid Take 1 capsule (100 mg total) by mouth 2 (two) times daily for 7 days.        Diet: routine diet  Activity: Advance as tolerated. Pelvic rest for 6 weeks.   Outpatient  follow up:  Follow-up Information     Linzie Collin, MD. Go in 1 week(s).   Specialties: Obstetrics and Gynecology, Radiology Why: office to sign consent for tubal ligation as soon as convenient. Then make appointment with Dr Logan Bores for tubal planning visit. Consent form must be signed 30 days prior to surgery. You will also have a 6 week postpartum visit. Contact information: [address] [place] [address] [phone]                   Postpartum contraception:  plans outpatient BTL  Rhogam Given postpartum: no Rubella vaccine given postpartum: immune Varicella vaccine given postpartum: immune TDaP given antepartum or postpartum: done prenatally    Newborn Delivery   Birth date/time: 11/30/2021 18:27:00 Delivery type: Vaginal, Spontaneous       Baby Feeding: Breast  Disposition:home with mother  SIGNED:  Doreene Burke, CNM 12/02/2021 8:30 AM

## 2021-11-30 NOTE — H&P (Signed)
OB History & Physical   History of Present Illness:  Chief Complaint: contractions  HPI:  Brenda Villegas is a 31 y.o. [phone] female at [redacted]w[redacted]d dated by LMP.  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    She reports contractions beginning at 2:30 this afternoon after sweep in the office.   She denies leakage of fluid.   She denies vaginal bleeding.   She reports fetal movement.  She arrived to L&D at 5:37 PM.  Total weight gain for pregnancy: 4.99 kg   Obstetrical Problem List: third Problems (from 04/15/21 to present)     Problem Noted Resolved   Labor and delivery, indication for care 11/30/2021 by Rod Can, CNM No   Supervision of high risk pregnancy, antepartum 04/15/2021 by Imagene Riches, CNM No   Overview Addendum 11/11/2021 11:04 AM by Allen Derry, [place] Staff Provider  Office Location  [facility] Dating  [redacted]w[redacted]d on 3/30   Language  English Anatomy US  Normal, IUGR-resolved   Flu Vaccine   Genetic Screen  NIPS: neg, xx  TDaP vaccine   11/11/2021 Hgb A1C or  GTT Early : Third trimester :   Covid      Rhogam  FOB rh neg Blood Type O negative  Feeding Plan Breast Antibody  neg  Contraception BTL Rubella  immune  Circumcision NA RPR   neg  Pediatrician  [facility], Dr Bobby Rumpf  HBsAg   neg  Support Person Josh  HIV  neg  Prenatal Classes multip  Varicella immune    GBS  (For PCN allergy, check sensitivities)   BTL Consent     VBAC Consent  Pap  04/15/21    Hgb Electro      CF      SMA              History of Hashimoto thyroiditis 06/28/2019 by Malachy Mood, MD No   Overview Addendum 05/08/2021 11:42 AM by Imagene Riches, CNM    TSH at [redacted] weeks gestation on 05/08/2021 is 7.7 Started on Levo 176mcg.       Hypothyroidism 08/07/2014 by Mar Daring, PA-C No        Maternal Medical History:   Past Medical History:  Diagnosis Date    Allergy    HPV in female    Hyperthyroidism 08/07/2014   Supervision of high risk pregnancy, antepartum 06/28/2019   Clinic [facility] Prenatal Labs  Dating 6 week ultrasound Blood type: O/Negative/-- (05/20 1121)   Genetic Screen Declines AFP: [ ]  Antibody:Negative (10/27 1531) positive on 1/14- weak Anti D antibody noted.  Anatomic [place] Normal female Rubella: 3.58 (05/20 1121)  Varicella: Immune  GTT   Third 103 trimester:  RPR: Non Reactive (10/27 1531)   Rhogam [x ] 28 weeks. Given 11/23 HBsAg: Negative (05/20 112    Past Surgical History:  Procedure Laterality Date   KNEE SURGERY Right 02/09/2004   TONSILLECTOMY     At age 78   WISDOM TOOTH EXTRACTION     all four    Allergies  Allergen Reactions   Penicillins Hives    Prior to Admission medications   Medication Sig Start Date End Date Taking? Authorizing Provider  aspirin 81 MG chewable tablet Chew 81 mg by mouth daily.   Yes [provider]  levothyroxine (SYNTHROID) 50 MCG tablet Take 1 tablet (50 mcg total) by  mouth daily before breakfast. 08/10/21  Yes Tresea Mall, CNM  nitrofurantoin, macrocrystal-monohydrate, (MACROBID) 100 MG capsule Take 1 capsule (100 mg total) by mouth 2 (two) times daily for 7 days. Patient not taking: Reported on 11/30/2021 11/28/21 12/05/21  Doreene Burke, CNM  Prenatal Vit-Fe Fumarate-FA (MULTIVITAMIN-PRENATAL) 27-0.8 MG TABS tablet Take 1 tablet by mouth daily at 12 noon.   Yes [provider]  fluticasone (FLONASE) 50 MCG/ACT nasal spray Place 1 spray into both nostrils daily. 08/13/19 08/28/19  LampteyBritta Mccreedy, MD    OB History  Gravida Para Term Preterm AB Living  3 3 3     3   SAB IAB Ectopic Multiple Live Births        0 3    # Outcome Date GA Lbr Len/2nd Weight Sex Delivery Anes PTL Lv  3 Term 11/30/21 [redacted]w[redacted]d / 00:03 3515 g F Vag-Spont None  LIV  2 Term 02/21/20 [redacted]w[redacted]d / 00:37 4160 g M Vag-Spont EPI  LIV  1 Term 11/06/13    11/08/13   LIV    Prenatal care site:  [facility]  Social History: She  reports that she quit smoking about 8 months ago. Her smoking use included cigarettes and e-cigarettes. She has never used smokeless tobacco. She reports that she does not drink alcohol and does not use drugs.  Family History: family history includes Breast cancer in her maternal grandmother and paternal aunt; Cancer in her maternal grandfather; Hypertension in her mother; Ovarian cysts in her mother; Thyroid cancer in her maternal aunt.    Review of Systems:  Review of Systems  Constitutional:  Negative for chills and fever.  HENT:  Negative for congestion, ear discharge, ear pain, hearing loss, sinus pain and sore throat.   Eyes:  Negative for blurred vision and double vision.  Respiratory:  Negative for cough, shortness of breath and wheezing.   Cardiovascular:  Negative for chest pain, palpitations and leg swelling.  Gastrointestinal:  Positive for abdominal pain. Negative for blood in stool, constipation, diarrhea, heartburn, melena, nausea and vomiting.  Genitourinary:  Negative for dysuria, flank pain, frequency, hematuria and urgency.  Musculoskeletal:  Negative for back pain, joint pain and myalgias.  Skin:  Negative for itching and rash.  Neurological:  Negative for dizziness, tingling, tremors, sensory change, speech change, focal weakness, seizures, loss of consciousness, weakness and headaches.  Endo/Heme/Allergies:  Negative for environmental allergies. Does not bruise/bleed easily.  Psychiatric/Behavioral:  Negative for depression, hallucinations, memory loss, substance abuse and suicidal ideas. The patient is not nervous/anxious and does not have insomnia.      Physical Exam:  BP 124/77   Pulse 64   Temp 98.5 F (36.9 C) (Oral)   Resp 18   Ht 5\' 8"  (1.727 m)   Wt 114.3 kg   LMP 02/27/2021 (Exact Date)   Breastfeeding Unknown   BMI 38.32 kg/m   Constitutional: Well nourished, well developed female in no acute distress.  HEENT:  normal Skin: Warm and dry.  Cardiovascular: Regular rate and rhythm.   Extremity:  no edema   Respiratory: Clear to auscultation bilateral. Normal respiratory effort Abdomen: FHT present Neuro: DTRs 2+, Cranial nerves grossly intact Psych: Alert and Oriented x3. No memory deficits. Normal mood and affect.    Pelvic exam: per RN B. Nielson Complete at 6:24 PM with strong urge to push  Dr was available and present for the delivery at 6:27 PM   Baseline FHR: 115 beats/min   Variability: moderate  Accelerations: present   Decelerations: absent Contractions: present frequency: every 2 minutes Overall assessment: reassuring   Lab Results  Component Value Date   SARSCOV2NAA NEGATIVE 02/21/2020    Assessment:  Brenda Villegas is a 31 y.o. [phone] female at [redacted]w[redacted]d with active labor.   Plan:  Admit to Labor & Delivery  CBC, T&S, Clrs, IVF GBS negative.   Fetal well-being: category I Expectant management for vaginal delivery    Tresea Mall, [facility] 11/30/2021 7:23 PM

## 2021-11-30 NOTE — Progress Notes (Signed)
Routine Prenatal Care Visit  Subjective  Brenda Villegas is a 31 y.o. G3P2002 at [redacted]w[redacted]d being seen today for ongoing prenatal care.  She is currently monitored for the following issues for this high-risk pregnancy and has Hypothyroidism; History of Hashimoto thyroiditis; Rh negative state in antepartum period; Supervision of high risk pregnancy, antepartum; Pap smear of cervix shows high risk HPV present; and Obesity affecting pregnancy in second trimester on their problem list.  ----------------------------------------------------------------------------------- Patient reports  pelvic pressure, occasional contraction .  Pt desires to schedule IOL, she was abel to have a 40 wk Iol with her last child and would like the same, Discussed waiting for spontaneous labor versus elective induction, pt desires IOL as she has family coming in on Thursday  for the purpose of taking care of her.  -desires VE and sweep  -received mychart message about UTI, pt denies any sxs, at last visit [place] showed lueks and urine culture shows 50-100,000 mixed urogenital flora. Given this result, pt prefers not to treat. Will not treat.   Contractions: Irregular. Vag. Bleeding: Bloody Show.  Movement: Present. Leaking Fluid denies.  ----------------------------------------------------------------------------------- The following portions of the patient's history were reviewed and updated as appropriate: allergies, current medications, past family history, past medical history, past social history, past surgical history and problem list. Problem list updated.  Objective  Blood pressure 121/76, pulse 66, weight 252 lb (114.3 kg), last menstrual period 02/27/2021, not currently breastfeeding. Pregravid weight 241 lb (109.3 kg) Total Weight Gain 11 lb (4.99 kg) Urinalysis: Urine Protein Negative  Urine Glucose Negative  Fetal Status: Fetal Heart Rate (bpm): 140 Fundal Height: 39 cm Movement: Present  Presentation:  Vertex  General:  Alert, oriented and cooperative. Patient is in no acute distress.  Skin: Skin is warm and dry. No rash noted.   Cardiovascular: Normal heart rate noted  Respiratory: Normal respiratory effort, no problems with respiration noted  Abdomen: Soft, gravid, appropriate for gestational age. Pain/Pressure: Present     Pelvic:  Cervical exam performed Dilation: 3.5 Effacement (%): 70 Station: -2  Extremities: Normal range of motion.  Edema: None  Mental Status: Normal mood and affect. Normal behavior. Normal judgment and thought content.   Assessment   31 y.o. [id number] at [redacted]w[redacted]d by  12/04/2021, by Last Menstrual Period presenting for routine prenatal visit  Plan   third Problems (from 04/15/21 to present)     Problem Noted Resolved   Supervision of high risk pregnancy, antepartum 04/15/2021 by Imagene Riches, CNM No   Overview Addendum 11/11/2021 11:04 AM by Allen Derry, [place] Staff Provider  Office Location  [facility] Dating  [redacted]w[redacted]d on 3/30   Language  English Anatomy US  Normal, IUGR-resolved   Flu Vaccine   Genetic Screen  NIPS: neg, xx  TDaP vaccine   11/11/2021 Hgb A1C or  GTT Early : Third trimester :   Covid    O  Rhogam  FOB rh neg Blood Type negative  Feeding Plan Breast Antibody  neg  Contraception BTL Rubella  immune  Circumcision NA RPR   neg  Pediatrician  [facility], Dr Bobby Rumpf  HBsAg   neg  Support Person Josh  HIV  neg  Prenatal Classes multip  Varicella immune    GBS  (For PCN allergy, check sensitivities)   BTL Consent     VBAC Consent  Pap  04/15/21    Hgb Electro      CF  SMA               History of Hashimoto thyroiditis 06/28/2019 by Malachy Mood, MD No   Overview Addendum 05/08/2021 11:42 AM by Imagene Riches, CNM    TSH at [redacted] weeks gestation on 05/08/2021 is 7.7 Started on Levo 177mcg.       Hypothyroidism 08/07/2014 by Mar Daring, PA-C No        Term labor symptoms and general obstetric  precautions including but not limited to vaginal bleeding, contractions, leaking of fluid and fetal movement were reviewed in detail with the patient. Please refer to After Visit Summary for other counseling recommendations.   IOL 10/28 at 0800, orders placed   Roberto Scales, [facility]  11/30/21  2:07 PM

## 2021-12-01 LAB — CBC
HCT: 30 % — ABNORMAL LOW (ref 36.0–46.0)
Hemoglobin: 9.9 g/dL — ABNORMAL LOW (ref 12.0–15.0)
MCH: 26.2 pg (ref 26.0–34.0)
MCHC: 33 g/dL (ref 30.0–36.0)
MCV: 79.4 fL — ABNORMAL LOW (ref 80.0–100.0)
Platelets: 125 10*3/uL — ABNORMAL LOW (ref 150–400)
RBC: 3.78 MIL/uL — ABNORMAL LOW (ref 3.87–5.11)
RDW: 14.7 % (ref 11.5–15.5)
WBC: 11.3 10*3/uL — ABNORMAL HIGH (ref 4.0–10.5)
nRBC: 0 % (ref 0.0–0.2)

## 2021-12-01 LAB — RPR: RPR Ser Ql: NONREACTIVE

## 2021-12-01 NOTE — Lactation Note (Signed)
This note was copied from a baby's chart. Lactation Consultation Note  Patient Name: Brenda Villegas OINOM'V Date: 12/01/2021 Reason for consult: Initial assessment;Term Age:31 hours  Maternal Data Has patient been taught Hand Expression?: Yes Does the patient have breastfeeding experience prior to this delivery?: Yes How long did the patient breastfeed?: 3 months, 3-4 days (slow weight gain in first, wouldn't latch after circumcision per mom in second child)  P3, fast vaginal delivery 15 hours ago. Mom desires fully breastfeeding but gives challenging hx with other children.   Feeding Mother's Current Feeding Choice: Breast Milk  Baby has been breastfeeding well, voids and stools have already exceeded minimum expectations in the first 24 hours. Educated on feeding frequency in first 24hrs, output expectations, feeding on demand and early cues.  LATCH Score     Lactation Tools Discussed/Used    Interventions Interventions: Breast feeding basics reviewed;Skin to skin;Hand express;DEBP;Education  Reviewed position/alignment, deep vs shallow latch, wide mouth, hand expression if baby is sleepy, 8-12 attempts in first 24 hours, and then 8 or more feedings per 24 hours after that. Reviewed normal course of lactation, stomach size of baby, and milk supply and demand.  Discharge Discharge Education: Engorgement and breast care;Outpatient recommendation Pump: Personal  Guidance given for anticipated breast changes, management of breast fullness and engorgement. Outpatient lactation phone number provided; encouraged to call with questions and ongoing BF support as needed.  Consult Status Consult Status: PRN    Lavonia Drafts 12/01/2021, 9:34 AM

## 2021-12-01 NOTE — Progress Notes (Signed)
Post Partum Day 1 Subjective: no complaints, up ad lib, voiding, and tolerating PO  Objective: Blood pressure 120/75, pulse 75, temperature 98.4 F (36.9 C), temperature source Oral, resp. rate 19, height 5\' 8"  (1.727 m), weight 114.3 kg, last menstrual period 02/27/2021, SpO2 100 %, unknown if currently breastfeeding.  Physical Exam:  General: alert, cooperative, and no distress Lochia: appropriate Uterine Fundus: firm Incision: perineum is intact DVT Evaluation: No evidence of DVT seen on physical exam. Negative Homan's sign. No cords or calf tenderness.  Recent Labs    11/30/21 1805 12/01/21 0442  HGB 12.0 9.9*  HCT 36.3 30.0*    Assessment/Plan: Plan for discharge tomorrow and Breastfeeding Continue routine PP orders.   LOS: 1 day   Imagene Riches, CNM 12/01/2021, 10:47 AM

## 2021-12-02 NOTE — Final Progress Note (Signed)
Final Progress Note  Patient ID: Brenda Villegas MRN: [id number] DOB/AGE: [birth year] 31 y.o.  Admit date: 11/30/2021 Admitting provider: Harlin Heys, MD Discharge date: 12/02/2021   Admission Diagnoses: labor and delivery indication for care  Discharge Diagnoses:  Principal Problem:   Labor and delivery, indication for care Active Problems:   Hypothyroidism   Rh negative state in antepartum period   Postpartum care following vaginal delivery   Encounter for care or examination of lactating mother    Consults: None  Significant Findings/ Diagnostic Studies: labs: RH negative, infant O neg.  Procedures: none  Discharge Condition: good  Disposition: Discharge disposition: 01-Home or Self Care       Diet: Regular diet  Discharge Activity: No heavy lifting, pushing, pulling with the implant side for 2 months   Allergies as of 12/02/2021       Reactions   Penicillins Hives        Medication List     STOP taking these medications    aspirin 81 MG chewable tablet       TAKE these medications    levothyroxine 50 MCG tablet Commonly known as: Synthroid Take 1 tablet (50 mcg total) by mouth daily before breakfast.   multivitamin-prenatal 27-0.8 MG Tabs tablet Take 1 tablet by mouth daily at 12 noon.   nitrofurantoin (macrocrystal-monohydrate) 100 MG capsule Commonly known as: Macrobid Take 1 capsule (100 mg total) by mouth 2 (two) times daily for 7 days.        Follow-up Information     Harlin Heys, MD. Go in 1 week(s).   Specialties: Obstetrics and Gynecology, Radiology Why: office to sign consent for tubal ligation as soon as convenient. Then make appointment with Dr Amalia Hailey for tubal planning visit. Consent form must be signed 30 days prior to surgery. You will also have a 6 week postpartum visit. Contact information: [address] [place] [address] [address] [phone]                 Total time  spent taking care of this patient: 10 minutes  Signed: Philip Aspen 12/02/2021, 8:31 AM

## 2021-12-02 NOTE — Progress Notes (Signed)
Patient responsive to teaching. No questions at this time. Patient to be wheeled out by volunteer.

## 2021-12-02 NOTE — Lactation Note (Signed)
This note was copied from a baby's chart. Lactation Consultation Note  Patient Name: Girl Brayley Mackowiak ALPFX'T Date: 12/02/2021 Reason for consult: Follow-up assessment;Term Age:31 hours  Maternal Data Has patient been taught Hand Expression?: Yes Does the patient have breastfeeding experience prior to this delivery?: Yes How long did the patient breastfeed?: 3 months & a few days  Feeding Mother's Current Feeding Choice: Breast Milk  Baby had a sleepy period yesterday, but cluster fed overnight/early this morning. Mom notes some soreness/tenderness; using lanolin she brought from home.  LATCH Score   Lactation Tools Discussed/Used    Interventions Interventions: Breast feeding basics reviewed;Hand express;Pre-pump if needed;Reverse pressure;Hand pump;DEBP;Ice;Education  Reviewed positioning and latching techniques, sandwiching of the breast tissue and rubbing from nose to chin. Encouraged 8 or more feedings per 24 hours, tracking of output expectations. Reviewed normal course of lactation and milk supply and demand. Encouraged hand expression to encourage a feed.  With anticipated breast changes we discussed the possible need for mom to hand express, reverse pressure softening, to ensure baby able to manipulate the tissue for deep latch.  Discharge Discharge Education: Engorgement and breast care;Warning signs for feeding baby;Outpatient recommendation Pump: Personal;Manual  Reviewed management of breast fullness and engorgement, ice for swelling as well as anti-inflammatory medicines.  Consult Status Consult Status: Complete  Outpatient lactation number provided; encouraged to call for ongoing BF support/questions.  Lavonia Drafts 12/02/2021, 10:30 AM

## 2021-12-17 ENCOUNTER — Ambulatory Visit: Payer: Medicaid Other | Admitting: Obstetrics and Gynecology

## 2021-12-23 ENCOUNTER — Telehealth: Payer: Medicaid Other | Admitting: Obstetrics and Gynecology

## 2021-12-23 ENCOUNTER — Encounter: Payer: Self-pay | Admitting: Obstetrics and Gynecology

## 2021-12-23 DIAGNOSIS — Z1332 Encounter for screening for maternal depression: Secondary | ICD-10-CM | POA: Diagnosis not present

## 2021-12-23 NOTE — Progress Notes (Signed)
Virtual Visit via Video Note  I connected with Brenda Villegas on 12/23/21 at  7:30 AM EST by video and verified that I was speaking with the correct person using two identifiers.    [name] is a 31 y.o. [phone] who LMP was Patient's last menstrual period was 02/27/2021 (exact date). I discussed the limitations, risks, security and privacy concerns of performing an evaluation and management service by video and the availability of in person appointments. I also discussed with the patient that there may be a patient responsible charge related to this service. The patient expressed understanding and agreed to proceed.  Location of patient: Home  Patient gave explicit verbal consent for video visit:  YES  Location of provider:  [facility] office  Persons other than physician and patient involved in provider conference:  None   Subjective:   History of Present Illness:    Patient had a vaginal delivery approximately 1 week ago.  She is currently breast-feeding full-time.  She also has a toddler at home.  She reports that breast-feeding is going well with the exception of the first 15 seconds of latching.  After that she feels well.  She is getting some rest and does have some help at home.  Her grandmother was helping and her husband is a big help when he gets home from work. She has again expressed her desire for permanent sterilization.  She states that she conceived on Mirena IUD with her last pregnancy and she would like to have a tubal ligation.  Hx: The following portions of the patient's history were reviewed and updated as appropriate:             She  has a past medical history of Allergy, HPV in female, Hyperthyroidism (08/07/2014), and Supervision of high risk pregnancy, antepartum (06/28/2019). She does not have any pertinent problems on file. She  has a past surgical history that includes Knee surgery (Right, 02/09/2004); Tonsillectomy; and Wisdom tooth  extraction. Her family history includes Breast cancer in her maternal grandmother and paternal aunt; Cancer in her maternal grandfather; Hypertension in her mother; Ovarian cysts in her mother; Thyroid cancer in her maternal aunt. She  reports that she quit smoking about 9 months ago. Her smoking use included cigarettes and e-cigarettes. She has never used smokeless tobacco. She reports that she does not drink alcohol and does not use drugs. She has a current medication list which includes the following prescription(s): levothyroxine, multivitamin-prenatal, and [DISCONTINUED] fluticasone. She is allergic to penicillins.       Review of Systems:  Review of Systems  Constitutional: Denied constitutional symptoms, night sweats, recent illness, fatigue, fever, insomnia and weight loss.  Eyes: Denied eye symptoms, eye pain, photophobia, vision change and visual disturbance.  Ears/Nose/Throat/Neck: Denied ear, nose, throat or neck symptoms, hearing loss, nasal discharge, sinus congestion and sore throat.  Cardiovascular: Denied cardiovascular symptoms, arrhythmia, chest pain/pressure, edema, exercise intolerance, orthopnea and palpitations.  Respiratory: Denied pulmonary symptoms, asthma, pleuritic pain, productive sputum, cough, dyspnea and wheezing.  Gastrointestinal: Denied, gastro-esophageal reflux, melena, nausea and vomiting.  Genitourinary: Denied genitourinary symptoms including symptomatic vaginal discharge, pelvic relaxation issues, and urinary complaints.  Musculoskeletal: Denied musculoskeletal symptoms, stiffness, swelling, muscle weakness and myalgia.  Dermatologic: Denied dermatology symptoms, rash and scar.  Neurologic: Denied neurology symptoms, dizziness, headache, neck pain and syncope.  Psychiatric: Denied psychiatric symptoms, anxiety and depression.  Endocrine: Denied endocrine symptoms including hot flashes and night sweats.   Meds:   Current Outpatient  Medications on File  Prior to Visit  Medication Sig Dispense Refill   levothyroxine (SYNTHROID) 50 MCG tablet Take 1 tablet (50 mcg total) by mouth daily before breakfast. 30 tablet 2   Prenatal Vit-Fe Fumarate-FA (MULTIVITAMIN-PRENATAL) 27-0.8 MG TABS tablet Take 1 tablet by mouth daily at 12 noon.     [DISCONTINUED] fluticasone (FLONASE) 50 MCG/ACT nasal spray Place 1 spray into both nostrils daily. 16 g 0   No current facility-administered medications on file prior to visit.    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 Encounter for screening for maternal depression     Patient doing well without issue.  Breast-feeding full-time.  Desires permanent sterilization.  Plan:            1.  Patient to schedule an appointment in approximately 2 to 3 weeks for preop for laparoscopic interval sterilization. Orders No orders of the defined types were placed in this encounter.   No orders of the defined types were placed in this encounter.     F/U  Return in about 2 weeks (around 01/06/2022).   Elonda Husky, M.D. 12/23/2021 7:43 AM

## 2021-12-31 ENCOUNTER — Other Ambulatory Visit: Payer: Self-pay | Admitting: Advanced Practice Midwife

## 2021-12-31 DIAGNOSIS — Z3A23 23 weeks gestation of pregnancy: Secondary | ICD-10-CM

## 2021-12-31 DIAGNOSIS — O0992 Supervision of high risk pregnancy, unspecified, second trimester: Secondary | ICD-10-CM

## 2021-12-31 DIAGNOSIS — E039 Hypothyroidism, unspecified: Secondary | ICD-10-CM

## 2022-01-06 ENCOUNTER — Other Ambulatory Visit: Payer: Self-pay | Admitting: Advanced Practice Midwife

## 2022-01-06 DIAGNOSIS — E039 Hypothyroidism, unspecified: Secondary | ICD-10-CM

## 2022-01-06 DIAGNOSIS — Z3A23 23 weeks gestation of pregnancy: Secondary | ICD-10-CM

## 2022-01-06 DIAGNOSIS — O0992 Supervision of high risk pregnancy, unspecified, second trimester: Secondary | ICD-10-CM

## 2022-01-13 ENCOUNTER — Encounter: Payer: Self-pay | Admitting: Obstetrics and Gynecology

## 2022-01-13 ENCOUNTER — Ambulatory Visit (INDEPENDENT_AMBULATORY_CARE_PROVIDER_SITE_OTHER): Payer: Medicaid Other | Admitting: Obstetrics and Gynecology

## 2022-01-13 DIAGNOSIS — E038 Other specified hypothyroidism: Secondary | ICD-10-CM

## 2022-01-13 DIAGNOSIS — E063 Autoimmune thyroiditis: Secondary | ICD-10-CM | POA: Diagnosis not present

## 2022-01-13 NOTE — Progress Notes (Signed)
HPI:      Brenda Villegas is a 31 y.o. [phone] who LMP was Patient's last menstrual period was 02/27/2021 (exact date).  Subjective:   She presents today for 6-week postpartum check.  She reports she is doing well.  She is breast-feeding without issue.  She desires permanent sterilization for birth control.  She has not resumed intercourse yet.  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  She has a history of hypothyroidism and was taking Synthroid but has not taken any since giving birth.    Hx: The following portions of the patient's history were reviewed and updated as appropriate:             She  has a past medical history of Allergy, HPV in female, Hyperthyroidism (08/07/2014), and Supervision of high risk pregnancy, antepartum (06/28/2019). She does not have any pertinent problems on file. She  has a past surgical history that includes Knee surgery (Right, 02/09/2004); Tonsillectomy; and Wisdom tooth extraction. Her family history includes Breast cancer in her maternal grandmother and paternal aunt; Cancer in her maternal grandfather; Hypertension in her mother; Ovarian cysts in her mother; Thyroid cancer in her maternal aunt. She  reports that she quit smoking about 9 months ago. Her smoking use included cigarettes and e-cigarettes. She has never used smokeless tobacco. She reports that she does not drink alcohol and does not use drugs. She has a current medication list which includes the following prescription(s): levothyroxine, multivitamin-prenatal, and [DISCONTINUED] fluticasone. She is allergic to penicillins.       Review of Systems:  Review of Systems  Constitutional: Denied constitutional symptoms, night sweats, recent illness, fatigue, fever, insomnia and weight loss.  Eyes: Denied eye symptoms, eye pain, photophobia, vision change and visual disturbance.  Ears/Nose/Throat/Neck: Denied ear, nose, throat or neck symptoms, hearing loss, nasal discharge, sinus  congestion and sore throat.  Cardiovascular: Denied cardiovascular symptoms, arrhythmia, chest pain/pressure, edema, exercise intolerance, orthopnea and palpitations.  Respiratory: Denied pulmonary symptoms, asthma, pleuritic pain, productive sputum, cough, dyspnea and wheezing.  Gastrointestinal: Denied, gastro-esophageal reflux, melena, nausea and vomiting.  Genitourinary: Denied genitourinary symptoms including symptomatic vaginal discharge, pelvic relaxation issues, and urinary complaints.  Musculoskeletal: Denied musculoskeletal symptoms, stiffness, swelling, muscle weakness and myalgia.  Dermatologic: Denied dermatology symptoms, rash and scar.  Neurologic: Denied neurology symptoms, dizziness, headache, neck pain and syncope.  Psychiatric: Denied psychiatric symptoms, anxiety and depression.  Endocrine: Denied endocrine symptoms including hot flashes and night sweats.   Meds:   Current Outpatient Medications on File Prior to Visit  Medication Sig Dispense Refill   levothyroxine (SYNTHROID) 50 MCG tablet TAKE 1 TABLET(50 MCG) BY MOUTH DAILY BEFORE BREAKFAST 30 tablet 0   Prenatal Vit-Fe Fumarate-FA (MULTIVITAMIN-PRENATAL) 27-0.8 MG TABS tablet Take 1 tablet by mouth daily at 12 noon.     [DISCONTINUED] fluticasone (FLONASE) 50 MCG/ACT nasal spray Place 1 spray into both nostrils daily. 16 g 0   No current facility-administered medications on file prior to visit.      Objective:     There were no vitals filed for this visit. There were no vitals filed for this visit.            Pelvic examination   Pelvic:   Vulva: Normal appearance.  No lesions.  No abnormal scarring.    Vagina: No lesions or abnormalities noted.  Support: Normal pelvic support.  Urethra No masses tenderness or scarring.  Meatus Normal size without lesions or prolapse.  Cervix: Normal  ectropion.  No lesions.  Anus: Normal exam.  No lesions.  Perineum: Normal exam.  No lesions.  Healed well.           Bimanual   Uterus: Normal size.  Non-tender.  Mobile.  AV.  Adnexae: No masses.  Non-tender to palpation.  Cul-de-sac: Negative for abnormality.             Assessment:    G3P3003 Patient Active Problem List   Diagnosis Date Noted   Labor and delivery, indication for care 11/30/2021   Pap smear of cervix shows high risk HPV present 05/07/2021   Postpartum care following vaginal delivery 02/22/2020   Encounter for care or examination of lactating mother 02/22/2020   History of Hashimoto thyroiditis 06/28/2019   Rh negative state in antepartum period 06/28/2019   Hypothyroidism 08/07/2014     1. Postpartum care following vaginal delivery   2. Hypothyroidism due to Hashimoto's thyroiditis     Doing well postpartum   Plan:            1.  Thyroid panel  2.  Patient may resume normal activities  3.  Return as scheduled for preop tubal  4.  Recommend colposcopy 3 months postpartum. Orders Orders Placed This Encounter  Procedures   Thyroid Profile    No orders of the defined types were placed in this encounter.     F/U  Return in about 6 weeks (around 02/24/2022).  Elonda Husky, M.D. 01/13/2022 3:05 PM

## 2022-01-13 NOTE — Progress Notes (Signed)
Patient presents today for 6 week postpartum follow-up. She had a vaginal delivery on 11/30/21. Patient is breast feeding, states it is going well. She states she would like BTL for birth control, pre-op scheduled. EPDS score of 2 . Patient states no other questions or concerns at this time.

## 2022-01-14 LAB — THYROID PANEL
Free Thyroxine Index: 1.7 (ref 1.2–4.9)
T3 Uptake Ratio: 25 % (ref 24–39)
T4, Total: 6.7 ug/dL (ref 4.5–12.0)

## 2022-01-21 ENCOUNTER — Encounter: Payer: Self-pay | Admitting: Obstetrics and Gynecology

## 2022-01-21 ENCOUNTER — Ambulatory Visit (INDEPENDENT_AMBULATORY_CARE_PROVIDER_SITE_OTHER): Payer: Medicaid Other | Admitting: Obstetrics and Gynecology

## 2022-01-21 VITALS — BP 123/77 | HR 74 | Ht 68.0 in | Wt 234.0 lb

## 2022-01-21 DIAGNOSIS — Z01818 Encounter for other preprocedural examination: Secondary | ICD-10-CM | POA: Diagnosis not present

## 2022-01-21 NOTE — H&P (Signed)
PRE-OPERATIVE HISTORY AND PHYSICAL EXAM  PCP:  Pcp, No Subjective:   HPI:  Brenda Villegas is a 31 y.o. [id number].  No LMP recorded.  She presents today for a pre-op discussion and PE.  She has the following symptoms: She is recently postpartum and desires permanent sterilization.  She is absolutely certain that she has completed childbearing.  Review of Systems:   Constitutional: Denied constitutional symptoms, night sweats, recent illness, fatigue, fever, insomnia and weight loss.  Eyes: Denied eye symptoms, eye pain, photophobia, vision change and visual disturbance.  Ears/Nose/Throat/Neck: Denied ear, nose, throat or neck symptoms, hearing loss, nasal discharge, sinus congestion and sore throat.  Cardiovascular: Denied cardiovascular symptoms, arrhythmia, chest pain/pressure, edema, exercise intolerance, orthopnea and palpitations.  Respiratory: Denied pulmonary symptoms, asthma, pleuritic pain, productive sputum, cough, dyspnea and wheezing.  Gastrointestinal: Denied, gastro-esophageal reflux, melena, nausea and vomiting.  Genitourinary: Denied genitourinary symptoms including symptomatic vaginal discharge, pelvic relaxation issues, and urinary complaints.  Musculoskeletal: Denied musculoskeletal symptoms, stiffness, swelling, muscle weakness and myalgia.  Dermatologic: Denied dermatology symptoms, rash and scar.  Neurologic: Denied neurology symptoms, dizziness, headache, neck pain and syncope.  Psychiatric: Denied psychiatric symptoms, anxiety and depression.  Endocrine: Denied endocrine symptoms including hot flashes and night sweats.   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  Anatomic 11-28-1980 Normal female Rubella: 3.58 (05/20 1121)  Varicella: Immune  GTT   Third 103 trimester:  RPR: Non Reactive (10/27 1531)   Rhogam [x ] 28 weeks. Given 11/23 HBsAg: Negative (05/20 112    Past Surgical History:  Procedure Laterality Date   KNEE SURGERY Right 02/09/2004   TONSILLECTOMY     At age 21   WISDOM TOOTH EXTRACTION     all four      SOCIAL HISTORY:  Social History   Tobacco Use  Smoking Status Former   Types: Cigarettes, E-cigarettes   Quit date: 03/24/2021   Years since quitting: 0.8  Smokeless Tobacco Never   Social History   Substance and Sexual Activity  Alcohol Use No   Alcohol/week: 0.0 standard drinks of alcohol    Social History   Substance and Sexual Activity  Drug Use No    Family History  Problem Relation Age of Onset   Hypertension Mother    Ovarian cysts Mother        removed cyst on one side in 2018   Thyroid cancer Maternal Aunt    Breast cancer Paternal Aunt        ? age   Breast cancer Maternal Grandmother  Cancer Maternal Grandfather        pancreatic/esophageal   Asthma Neg Hx    Heart disease Neg Hx    Diabetes Neg Hx    Stroke Neg Hx     ALLERGIES:  Penicillins  MEDS:   Current Outpatient Medications on File Prior to Visit  Medication Sig Dispense Refill   levothyroxine (SYNTHROID) 50 MCG tablet TAKE 1 TABLET(50 MCG) BY MOUTH DAILY BEFORE BREAKFAST 30 tablet 0   Prenatal Vit-Fe Fumarate-FA (MULTIVITAMIN-PRENATAL) 27-0.8 MG TABS tablet Take 1 tablet by mouth daily at 12 noon.     [DISCONTINUED] fluticasone (FLONASE) 50  MCG/ACT nasal spray Place 1 spray into both nostrils daily. 16 g 0   No current facility-administered medications on file prior to visit.    No orders of the defined types were placed in this encounter.    Physical examination BP 123/77   Pulse 74   Ht 5\' 8"  (1.727 m)   Wt 234 lb (106.1 kg)   Breastfeeding Yes   BMI 35.58 kg/m   General NAD, Conversant  HEENT Atraumatic; Op clear with mmm.  Normo-cephalic. Pupils reactive. Anicteric sclerae  Thyroid/Neck Smooth without nodularity or enlargement. Normal ROM.  Neck Supple.  Skin No rashes, lesions or ulceration. Normal palpated skin turgor. No nodularity.  Breasts: No masses or discharge.  Symmetric.  No axillary adenopathy.  Lungs: Clear to auscultation.No rales or wheezes. Normal Respiratory effort, no retractions.  Heart: NSR.  No murmurs or rubs appreciated. No periferal edema  Abdomen: Soft.  Non-tender.  No masses.  No HSM. No hernia  Extremities: Moves all appropriately.  Normal ROM for age. No lymphadenopathy.  Neuro: Oriented to PPT.  Normal mood. Normal affect.     Pelvic:   Vulva: Normal appearance.  No lesions.  Vagina: No lesions or abnormalities noted.  Support: Normal pelvic support.  Urethra No masses tenderness or scarring.  Meatus Normal size without lesions or prolapse.  Cervix: Normal ectropion.  No lesions.  Anus: Normal exam.  No lesions.  Perineum: Normal exam.  No lesions.        Bimanual   Uterus: Normal size.  Non-tender.  Mobile.  AV.  Adnexae: No masses.  Non-tender to palpation.  Cul-de-sac: Negative for abnormality.   Assessment:   G3P3003 Patient Active Problem List   Diagnosis Date Noted   Labor and delivery, indication for care 11/30/2021   Pap smear of cervix shows high risk HPV present 05/07/2021   Postpartum care following vaginal delivery 02/22/2020   Encounter for care or examination of lactating mother 02/22/2020   History of Hashimoto thyroiditis 06/28/2019   Rh negative state  in antepartum period 06/28/2019   Hypothyroidism 08/07/2014    1. Pre-op exam      Plan:   Orders: No orders of the defined types were placed in this encounter.    1.  Laparoscopic permanent sterilization  Pre-op discussions regarding Risks and Benefits of her scheduled surgery.  08/09/2014, M.D. 01/21/2022 3:42 PM

## 2022-01-21 NOTE — Progress Notes (Signed)
PRE-OPERATIVE HISTORY AND PHYSICAL EXAM  PCP:  Pcp, No Subjective:   HPI:  Brenda Villegas is a 31 y.o. [id number].  No LMP recorded.  She presents today for a pre-op discussion and PE.  She has the following symptoms: She is recently postpartum and desires permanent sterilization.  She is absolutely certain that she has completed childbearing.  Review of Systems:   Constitutional: Denied constitutional symptoms, night sweats, recent illness, fatigue, fever, insomnia and weight loss.  Eyes: Denied eye symptoms, eye pain, photophobia, vision change and visual disturbance.  Ears/Nose/Throat/Neck: Denied ear, nose, throat or neck symptoms, hearing loss, nasal discharge, sinus congestion and sore throat.  Cardiovascular: Denied cardiovascular symptoms, arrhythmia, chest pain/pressure, edema, exercise intolerance, orthopnea and palpitations.  Respiratory: Denied pulmonary symptoms, asthma, pleuritic pain, productive sputum, cough, dyspnea and wheezing.  Gastrointestinal: Denied, gastro-esophageal reflux, melena, nausea and vomiting.  Genitourinary: Denied genitourinary symptoms including symptomatic vaginal discharge, pelvic relaxation issues, and urinary complaints.  Musculoskeletal: Denied musculoskeletal symptoms, stiffness, swelling, muscle weakness and myalgia.  Dermatologic: Denied dermatology symptoms, rash and scar.  Neurologic: Denied neurology symptoms, dizziness, headache, neck pain and syncope.  Psychiatric: Denied psychiatric symptoms, anxiety and depression.  Endocrine: Denied endocrine symptoms including hot flashes and night sweats.   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  Anatomic [birth year]1980 Normal female Rubella: 3.58 (05/20 1121)  Varicella: Immune  GTT   Third 103 trimester:  RPR: Non Reactive (10/27 1531)   Rhogam [x ] 28 weeks. Given 11/23 HBsAg: Negative (05/20 112    Past Surgical History:  Procedure Laterality Date   KNEE SURGERY Right 02/09/2004   TONSILLECTOMY     At age 21   WISDOM TOOTH EXTRACTION     all four      SOCIAL HISTORY:  Social History   Tobacco Use  Smoking Status Former   Types: Cigarettes, E-cigarettes   Quit date: 03/24/2021   Years since quitting: 0.8  Smokeless Tobacco Never   Social History   Substance and Sexual Activity  Alcohol Use No   Alcohol/week: 0.0 standard drinks of alcohol    Social History   Substance and Sexual Activity  Drug Use No    Family History  Problem Relation Age of Onset   Hypertension Mother    Ovarian cysts Mother        removed cyst on one side in 2018   Thyroid cancer Maternal Aunt    Breast cancer Paternal Aunt        ? age   Breast cancer Maternal Grandmother  Cancer Maternal Grandfather        pancreatic/esophageal   Asthma Neg Hx    Heart disease Neg Hx    Diabetes Neg Hx    Stroke Neg Hx     ALLERGIES:  Penicillins  MEDS:   Current Outpatient Medications on File Prior to Visit  Medication Sig Dispense Refill   levothyroxine (SYNTHROID) 50 MCG tablet TAKE 1 TABLET(50 MCG) BY MOUTH DAILY BEFORE BREAKFAST 30 tablet 0   Prenatal Vit-Fe Fumarate-FA (MULTIVITAMIN-PRENATAL) 27-0.8 MG TABS tablet Take 1 tablet by mouth daily at 12 noon.     [DISCONTINUED] fluticasone (FLONASE) 50  MCG/ACT nasal spray Place 1 spray into both nostrils daily. 16 g 0   No current facility-administered medications on file prior to visit.    No orders of the defined types were placed in this encounter.    Physical examination BP 123/77   Pulse 74   Ht 5\' 8"  (1.727 m)   Wt 234 lb (106.1 kg)   Breastfeeding Yes   BMI 35.58 kg/m   General NAD, Conversant  HEENT Atraumatic; Op clear with mmm.  Normo-cephalic. Pupils reactive. Anicteric sclerae  Thyroid/Neck Smooth without nodularity or enlargement. Normal ROM.  Neck Supple.  Skin No rashes, lesions or ulceration. Normal palpated skin turgor. No nodularity.  Breasts: No masses or discharge.  Symmetric.  No axillary adenopathy.  Lungs: Clear to auscultation.No rales or wheezes. Normal Respiratory effort, no retractions.  Heart: NSR.  No murmurs or rubs appreciated. No periferal edema  Abdomen: Soft.  Non-tender.  No masses.  No HSM. No hernia  Extremities: Moves all appropriately.  Normal ROM for age. No lymphadenopathy.  Neuro: Oriented to PPT.  Normal mood. Normal affect.     Pelvic:   Vulva: Normal appearance.  No lesions.  Vagina: No lesions or abnormalities noted.  Support: Normal pelvic support.  Urethra No masses tenderness or scarring.  Meatus Normal size without lesions or prolapse.  Cervix: Normal ectropion.  No lesions.  Anus: Normal exam.  No lesions.  Perineum: Normal exam.  No lesions.        Bimanual   Uterus: Normal size.  Non-tender.  Mobile.  AV.  Adnexae: No masses.  Non-tender to palpation.  Cul-de-sac: Negative for abnormality.   Assessment:   [id number] Patient Active Problem List   Diagnosis Date Noted   Labor and delivery, indication for care 11/30/2021   Pap smear of cervix shows high risk HPV present 05/07/2021   Postpartum care following vaginal delivery 02/22/2020   Encounter for care or examination of lactating mother 02/22/2020   History of Hashimoto thyroiditis 06/28/2019   Rh negative state  in antepartum period 06/28/2019   Hypothyroidism 08/07/2014    1. Pre-op exam      Plan:   Orders: No orders of the defined types were placed in this encounter.    1.  Laparoscopic permanent sterilization  Pre-op discussions regarding Risks and Benefits of her scheduled surgery.  Tubal We have discussed permanent sterilization in detail.  I have reviewed Filshie clip placement as a means of sterilization.  I have explained the risks and benefits of this procedure in detail.  I have stressed the fact that this is a permanent and not reversible procedure and there is a failure rate of 3 to7 per 1000 which is somewhat timing and method dependent.  I have discussed the possibility of inadvertent damage to bowel, bladder, blood vessels or other internal organs..  I have specifically discussed the risk of  anesthesia, infection and blood loss with her.  We have discussed the possibility of laparotomy should there be a problem and the additional possibility of a longer hospital stay.  I have spoken with her regarding the recovery period, informing her that after this procedure, most people are performing their normal daily activities within one week.  I have recommended abstinence or another birth control method for 2-4 weeks following this procedure.  Other options of birth control have also been discussed.  The procedure of sterilization and alternate methods of birth control were discussed in detail with the patient.  I have answered all her questions and I believe that she has an adequate and informed understanding of the procedure.  I spent 33 minutes involved in the care of this patient preparing to see the patient by obtaining and reviewing her medical history (including labs, imaging tests and prior procedures), documenting clinical information in the electronic health record (EHR), counseling and coordinating care plans, writing and sending prescriptions, ordering tests or procedures and in  direct communicating with the patient and medical staff discussing pertinent items from her history and physical exam.  Elonda Husky, M.D. 01/21/2022 3:42 PM

## 2022-01-21 NOTE — Progress Notes (Signed)
Patient presents today for a pre-op exam prior to BTL. She states no additional concerns today.

## 2022-02-19 ENCOUNTER — Encounter
Admission: RE | Admit: 2022-02-19 | Discharge: 2022-02-19 | Disposition: A | Discharge status: Home / Self Care | Payer: Medicaid Other | Source: Ambulatory Visit | Attending: Obstetrics and Gynecology | Admitting: Obstetrics and Gynecology

## 2022-02-19 DIAGNOSIS — Z01812 Encounter for preprocedural laboratory examination: Secondary | ICD-10-CM

## 2022-02-19 DIAGNOSIS — Z01818 Encounter for other preprocedural examination: Secondary | ICD-10-CM

## 2022-02-19 DIAGNOSIS — D649 Anemia, unspecified: Secondary | ICD-10-CM

## 2022-02-19 HISTORY — DX: Personal history of other diseases of the nervous system and sense organs: Z86.69

## 2022-02-19 HISTORY — DX: Gastro-esophageal reflux disease without esophagitis: K21.9

## 2022-02-19 HISTORY — DX: Anemia, unspecified: D64.9

## 2022-02-19 NOTE — Patient Instructions (Signed)
Your procedure is scheduled on:03-01-22 Monday Report to the Registration Desk on the 1st floor of the [facility].Then proceed to the 2nd floor Surgery Desk To find out your arrival time, please call [phone] between 1PM - 3PM on:02-26-22 Friday If your arrival time is 6:00 am, do not arrive prior to that time as the [facility] entrance doors do not open until 6:00 am.  REMEMBER: Instructions that are not followed completely may result in serious medical risk, up to and including death; or upon the discretion of your surgeon and anesthesiologist your surgery may need to be rescheduled.  Do not eat food OR drink any liquids after midnight the night before surgery.  No gum chewing, lozengers or hard candies.  Do NOT take any medication the day of surgery  Bring your breast pump to the hospital   One week prior to surgery: Stop Anti-inflammatories (NSAIDS) such as Advil, Aleve, Ibuprofen, Motrin, Naproxen, Naprosyn and Aspirin based products such as Excedrin, Goodys Powder, BC Powder.You may however, continue to take Tylenol if needed for pain up until the day of surgery.  Stop ANY OVER THE COUNTER supplements/vitamins 7 days prior to surgery (Prenatal Vitamin)  No Alcohol for 24 hours before or after surgery.  No Smoking including e-cigarettes for 24 hours prior to surgery.  No chewable tobacco products for at least 6 hours prior to surgery.  No nicotine patches on the day of surgery.  Do not use any "recreational" drugs for at least a week prior to your surgery.  Please be advised that the combination of cocaine and anesthesia may have negative outcomes, up to and including death. If you test positive for cocaine, your surgery will be cancelled.  On the morning of surgery brush your teeth with toothpaste and water, you may rinse your mouth with mouthwash if you wish. Do not swallow any toothpaste or mouthwash.  Use CHG Soap as directed on instruction sheet.  Do not wear  jewelry, make-up, hairpins, clips or nail polish.  Do not wear lotions, powders, or perfumes.   Do not shave body from the neck down 48 hours prior to surgery just in case you cut yourself which could leave a site for infection.  Also, freshly shaved skin may become irritated if using the CHG soap.  Contact lenses, hearing aids and dentures may not be worn into surgery.  Do not bring valuables to the hospital. [facility] is not responsible for any missing/lost belongings or valuables.   Notify your doctor if there is any change in your medical condition (cold, fever, infection).  Wear comfortable clothing (specific to your surgery type) to the hospital.  After surgery, you can help prevent lung complications by doing breathing exercises.  Take deep breaths and cough every 1-2 hours. Your doctor may order a device called an Incentive Spirometer to help you take deep breaths. When coughing or sneezing, hold a pillow firmly against your incision with both hands. This is called "splinting." Doing this helps protect your incision. It also decreases belly discomfort.  If you are being admitted to the hospital overnight, leave your suitcase in the car. After surgery it may be brought to your room.  If you are being discharged the day of surgery, you will not be allowed to drive home. You will need a responsible adult (18 years or older) to drive you home and stay with you that night.   If you are taking public transportation, you will need to have a responsible adult (  18 years or older) with you. Please confirm with your physician that it is acceptable to use public transportation.   Please call the [facility] at [phone] if you have any questions about these instructions.  Surgery Visitation Policy:  Patients undergoing a surgery or procedure may have two family members or support persons with them as long as the person is not COVID-19 positive or experiencing its  symptoms.   Due to an increase in RSV and influenza rates and associated hospitalizations, children ages 56 and under will not be able to visit patients in [facility]. Masks continue to be strongly recommended.

## 2022-02-26 ENCOUNTER — Encounter
Admission: RE | Admit: 2022-02-26 | Discharge: 2022-02-26 | Disposition: A | Discharge status: Home / Self Care | Payer: Medicaid Other | Source: Ambulatory Visit | Attending: Obstetrics and Gynecology | Admitting: Obstetrics and Gynecology

## 2022-02-26 DIAGNOSIS — Z01812 Encounter for preprocedural laboratory examination: Secondary | ICD-10-CM | POA: Diagnosis present

## 2022-02-26 DIAGNOSIS — D649 Anemia, unspecified: Secondary | ICD-10-CM | POA: Insufficient documentation

## 2022-02-26 DIAGNOSIS — Z01818 Encounter for other preprocedural examination: Secondary | ICD-10-CM

## 2022-02-26 LAB — TYPE AND SCREEN
ABO/RH(D): O NEG
Antibody Screen: NEGATIVE
Extend sample reason: UNDETERMINED

## 2022-02-26 LAB — CBC
HCT: 40.7 % (ref 36.0–46.0)
Hemoglobin: 12.9 g/dL (ref 12.0–15.0)
MCH: 25.4 pg — ABNORMAL LOW (ref 26.0–34.0)
MCHC: 31.7 g/dL (ref 30.0–36.0)
MCV: 80.3 fL (ref 80.0–100.0)
Platelets: 230 10*3/uL (ref 150–400)
RBC: 5.07 MIL/uL (ref 3.87–5.11)
RDW: 14.6 % (ref 11.5–15.5)
WBC: 8.3 10*3/uL (ref 4.0–10.5)
nRBC: 0 % (ref 0.0–0.2)

## 2022-02-28 MED ORDER — POVIDONE-IODINE 10 % EX SWAB
2.0000 | Freq: Once | CUTANEOUS | Status: AC
  Administered 2022-03-01: 2 via TOPICAL

## 2022-02-28 MED ORDER — FAMOTIDINE 20 MG PO TABS: 20.0000 mg | ORAL_TABLET | Freq: Once | ORAL | Status: AC

## 2022-02-28 MED ORDER — CHLORHEXIDINE GLUCONATE 0.12 % MT SOLN: 15.0000 mL | Freq: Once | OROMUCOSAL | Status: AC

## 2022-02-28 MED ORDER — LACTATED RINGERS IV SOLN: INTRAVENOUS | Status: DC

## 2022-02-28 MED ORDER — ORAL CARE MOUTH RINSE: 15.0000 mL | Freq: Once | OROMUCOSAL | Status: AC

## 2022-03-01 ENCOUNTER — Ambulatory Visit
Admission: RE | Admit: 2022-03-01 | Discharge: 2022-03-01 | Disposition: A | Discharge status: Home / Self Care | Payer: Medicaid Other | Attending: Obstetrics and Gynecology | Admitting: Obstetrics and Gynecology

## 2022-03-01 ENCOUNTER — Encounter: Payer: Self-pay | Admitting: Obstetrics and Gynecology

## 2022-03-01 ENCOUNTER — Ambulatory Visit: Payer: Medicaid Other | Admitting: Urgent Care

## 2022-03-01 ENCOUNTER — Other Ambulatory Visit: Payer: Self-pay

## 2022-03-01 ENCOUNTER — Ambulatory Visit: Payer: Medicaid Other | Admitting: Certified Registered Nurse Anesthetist

## 2022-03-01 ENCOUNTER — Encounter
Admission: RE | Disposition: A | Discharge status: Home / Self Care | Payer: Self-pay | Source: Home / Self Care | Attending: Obstetrics and Gynecology

## 2022-03-01 DIAGNOSIS — Z302 Encounter for sterilization: Secondary | ICD-10-CM

## 2022-03-01 DIAGNOSIS — Z87891 Personal history of nicotine dependence: Secondary | ICD-10-CM | POA: Diagnosis not present

## 2022-03-01 DIAGNOSIS — Z01818 Encounter for other preprocedural examination: Secondary | ICD-10-CM

## 2022-03-01 HISTORY — PX: LAPAROSCOPIC TUBAL LIGATION: SHX1937

## 2022-03-01 LAB — POCT PREGNANCY, URINE
Preg Test, Ur: NEGATIVE
Preg Test, Ur: NEGATIVE

## 2022-03-01 SURGERY — LIGATION, FALLOPIAN TUBE, LAPAROSCOPIC
Anesthesia: General | Site: Abdomen | Laterality: Bilateral

## 2022-03-01 MED ORDER — FENTANYL CITRATE (PF) 100 MCG/2ML IJ SOLN
25.0000 ug | INTRAMUSCULAR | Status: DC | PRN
  Administered 2022-03-01: 25 ug via INTRAVENOUS

## 2022-03-01 MED ORDER — OXYCODONE HCL 5 MG PO TABS
ORAL_TABLET | ORAL | Status: AC
  Filled 2022-03-01: qty 1

## 2022-03-01 MED ORDER — PROPOFOL 10 MG/ML IV BOLUS
INTRAVENOUS | Status: DC | PRN
  Administered 2022-03-01 (×3): 20 mg via INTRAVENOUS
  Administered 2022-03-01: 200 mg via INTRAVENOUS

## 2022-03-01 MED ORDER — FENTANYL CITRATE (PF) 100 MCG/2ML IJ SOLN
INTRAMUSCULAR | Status: AC
  Filled 2022-03-01: qty 2

## 2022-03-01 MED ORDER — FENTANYL CITRATE (PF) 100 MCG/2ML IJ SOLN
INTRAMUSCULAR | Status: AC
  Administered 2022-03-01: 25 ug via INTRAVENOUS
  Filled 2022-03-01: qty 2

## 2022-03-01 MED ORDER — PROPOFOL 10 MG/ML IV BOLUS
INTRAVENOUS | Status: AC
  Filled 2022-03-01: qty 20

## 2022-03-01 MED ORDER — BUPIVACAINE HCL (PF) 0.5 % IJ SOLN
INTRAMUSCULAR | Status: DC | PRN
  Administered 2022-03-01: 7.5 mL

## 2022-03-01 MED ORDER — DEXAMETHASONE SODIUM PHOSPHATE 10 MG/ML IJ SOLN
INTRAMUSCULAR | Status: DC | PRN
  Administered 2022-03-01: 10 mg via INTRAVENOUS

## 2022-03-01 MED ORDER — MIDAZOLAM HCL 2 MG/2ML IJ SOLN
INTRAMUSCULAR | Status: DC | PRN
  Administered 2022-03-01: 2 mg via INTRAVENOUS

## 2022-03-01 MED ORDER — FAMOTIDINE 20 MG PO TABS
ORAL_TABLET | ORAL | Status: AC
  Administered 2022-03-01: 20 mg via ORAL
  Filled 2022-03-01: qty 1

## 2022-03-01 MED ORDER — CHLORHEXIDINE GLUCONATE 0.12 % MT SOLN
OROMUCOSAL | Status: AC
  Administered 2022-03-01: 15 mL via OROMUCOSAL
  Filled 2022-03-01: qty 15

## 2022-03-01 MED ORDER — SUGAMMADEX SODIUM 200 MG/2ML IV SOLN
INTRAVENOUS | Status: DC | PRN
  Administered 2022-03-01: 424.4 mg via INTRAVENOUS

## 2022-03-01 MED ORDER — OXYCODONE HCL 5 MG/5ML PO SOLN: 5.0000 mg | Freq: Once | ORAL | Status: AC | PRN

## 2022-03-01 MED ORDER — MIDAZOLAM HCL 2 MG/2ML IJ SOLN
INTRAMUSCULAR | Status: AC
  Filled 2022-03-01: qty 2

## 2022-03-01 MED ORDER — HYDROMORPHONE HCL 1 MG/ML IJ SOLN
INTRAMUSCULAR | Status: AC
  Filled 2022-03-01: qty 1

## 2022-03-01 MED ORDER — HYDROCODONE-ACETAMINOPHEN 5-325 MG PO TABS: 1.0000 | ORAL_TABLET | Freq: Four times a day (QID) | ORAL | 0 refills | Status: DC | PRN

## 2022-03-01 MED ORDER — ROCURONIUM BROMIDE 100 MG/10ML IV SOLN
INTRAVENOUS | Status: DC | PRN
  Administered 2022-03-01: 40 mg via INTRAVENOUS

## 2022-03-01 MED ORDER — HYDROMORPHONE HCL 1 MG/ML IJ SOLN
INTRAMUSCULAR | Status: DC | PRN
  Administered 2022-03-01: .4 mg via INTRAVENOUS
  Administered 2022-03-01: .2 mg via INTRAVENOUS
  Administered 2022-03-01: .4 mg via INTRAVENOUS

## 2022-03-01 MED ORDER — LIDOCAINE HCL (CARDIAC) PF 100 MG/5ML IV SOSY
PREFILLED_SYRINGE | INTRAVENOUS | Status: DC | PRN
  Administered 2022-03-01: 100 mg via INTRAVENOUS

## 2022-03-01 MED ORDER — GLYCOPYRROLATE 0.2 MG/ML IJ SOLN
INTRAMUSCULAR | Status: DC | PRN
  Administered 2022-03-01: .1 mg via INTRAVENOUS

## 2022-03-01 MED ORDER — BUPIVACAINE HCL (PF) 0.5 % IJ SOLN
INTRAMUSCULAR | Status: AC
  Filled 2022-03-01: qty 30

## 2022-03-01 MED ORDER — KETOROLAC TROMETHAMINE 30 MG/ML IJ SOLN
INTRAMUSCULAR | Status: DC | PRN
  Administered 2022-03-01: 30 mg via INTRAVENOUS

## 2022-03-01 MED ORDER — OXYCODONE HCL 5 MG PO TABS
5.0000 mg | ORAL_TABLET | Freq: Once | ORAL | Status: AC | PRN
  Administered 2022-03-01: 5 mg via ORAL

## 2022-03-01 MED ORDER — 0.9 % SODIUM CHLORIDE (POUR BTL) OPTIME
TOPICAL | Status: DC | PRN
  Administered 2022-03-01: 500 mL

## 2022-03-01 MED ORDER — ONDANSETRON HCL 4 MG/2ML IJ SOLN
INTRAMUSCULAR | Status: DC | PRN
  Administered 2022-03-01: 4 mg via INTRAVENOUS

## 2022-03-01 MED ORDER — FENTANYL CITRATE (PF) 100 MCG/2ML IJ SOLN
INTRAMUSCULAR | Status: DC | PRN
  Administered 2022-03-01: 100 ug via INTRAVENOUS

## 2022-03-01 SURGICAL SUPPLY — 33 items
ADH SKN CLS APL DERMABOND .7 (GAUZE/BANDAGES/DRESSINGS) ×1
APL PRP STRL LF DISP 70% ISPRP (MISCELLANEOUS) ×1
BLADE CLIPPER SURG (BLADE) IMPLANT
BLADE SURG SZ11 CARB STEEL (BLADE) ×2 IMPLANT
CATH ROBINSON RED A/P 16FR (CATHETERS) ×2 IMPLANT
CHLORAPREP W/TINT 26 (MISCELLANEOUS) ×2 IMPLANT
CLIP FILSHIE TUBAL LIGA STRL (Clip) ×2 IMPLANT
DERMABOND ADVANCED .7 DNX12 (GAUZE/BANDAGES/DRESSINGS) ×2 IMPLANT
GAUZE 4X4 16PLY ~~LOC~~+RFID DBL (SPONGE) ×2 IMPLANT
GLOVE PI ORTHO PRO STRL 7.5 (GLOVE) ×4 IMPLANT
GOWN STRL REUS W/ TWL LRG LVL3 (GOWN DISPOSABLE) ×2 IMPLANT
GOWN STRL REUS W/ TWL XL LVL3 (GOWN DISPOSABLE) ×2 IMPLANT
GOWN STRL REUS W/TWL LRG LVL3 (GOWN DISPOSABLE) ×1
GOWN STRL REUS W/TWL XL LVL3 (GOWN DISPOSABLE) ×1
IRRIGATION STRYKERFLOW (MISCELLANEOUS) IMPLANT
IRRIGATOR STRYKERFLOW (MISCELLANEOUS)
IV LACTATED RINGERS 1000ML (IV SOLUTION) IMPLANT
KIT PINK PAD W/HEAD ARE REST (MISCELLANEOUS) ×1
KIT PINK PAD W/HEAD ARM REST (MISCELLANEOUS) ×2 IMPLANT
KIT TURNOVER CYSTO (KITS) ×2 IMPLANT
MANIFOLD NEPTUNE II (INSTRUMENTS) ×2 IMPLANT
NS IRRIG 500ML POUR BTL (IV SOLUTION) ×2 IMPLANT
PACK GYN LAPAROSCOPIC (MISCELLANEOUS) ×2 IMPLANT
PAD OB MATERNITY 4.3X12.25 (PERSONAL CARE ITEMS) ×2 IMPLANT
PAD PREP 24X41 OB/GYN DISP (PERSONAL CARE ITEMS) ×2 IMPLANT
SET TUBE SMOKE EVAC HIGH FLOW (TUBING) ×2 IMPLANT
SOL PREP PVP 2OZ (MISCELLANEOUS) ×1
SOLUTION PREP PVP 2OZ (MISCELLANEOUS) ×2 IMPLANT
SUT VIC AB 4-0 FS2 27 (SUTURE) ×2 IMPLANT
SUT VICRYL 0 UR6 27IN ABS (SUTURE) ×2 IMPLANT
TRAP FLUID SMOKE EVACUATOR (MISCELLANEOUS) ×2 IMPLANT
TROCAR XCEL 12X100 BLDLESS (ENDOMECHANICALS) ×2 IMPLANT
WATER STERILE IRR 500ML POUR (IV SOLUTION) ×2 IMPLANT

## 2022-03-01 NOTE — H&P (Signed)
PRE-OPERATIVE HISTORY AND PHYSICAL EXAM   PCP:  Pcp, No Subjective:    HPI:  Brenda Villegas is a 32 y.o. [id number].  No LMP recorded.  She presents today for a pre-op discussion and PE.   She has the following symptoms: She is recently postpartum and desires permanent sterilization.  She is absolutely certain that she has completed childbearing.   Review of Systems:    Constitutional: Denied constitutional symptoms, night sweats, recent illness, fatigue, fever, insomnia and weight loss.  Eyes: Denied eye symptoms, eye pain, photophobia, vision change and visual disturbance.  Ears/Nose/Throat/Neck: Denied ear, nose, throat or neck symptoms, hearing loss, nasal discharge, sinus congestion and sore throat.  Cardiovascular: Denied cardiovascular symptoms, arrhythmia, chest pain/pressure, edema, exercise intolerance, orthopnea and palpitations.  Respiratory: Denied pulmonary symptoms, asthma, pleuritic pain, productive sputum, cough, dyspnea and wheezing.  Gastrointestinal: Denied, gastro-esophageal reflux, melena, nausea and vomiting.  Genitourinary: Denied genitourinary symptoms including symptomatic vaginal discharge, pelvic relaxation issues, and urinary complaints.  Musculoskeletal: Denied musculoskeletal symptoms, stiffness, swelling, muscle weakness and myalgia.  Dermatologic: Denied dermatology symptoms, rash and scar.  Neurologic: Denied neurology symptoms, dizziness, headache, neck pain and syncope.  Psychiatric: Denied psychiatric symptoms, anxiety and depression.  Endocrine: Denied endocrine symptoms including hot flashes and night sweats.                    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  Anatomic [place] Normal female Rubella: 3.58 (05/20 1121)  Varicella: Immune  GTT   Third 103 trimester:  RPR: Non Reactive (10/27 1531)   Rhogam [x ] 28 weeks. Given 11/23 HBsAg: Negative (05/20 112           Past Surgical History:  Procedure Laterality Date   KNEE SURGERY Right 02/09/2004   TONSILLECTOMY        At age 82   WISDOM TOOTH EXTRACTION        all four       SOCIAL HISTORY:   Social History        Tobacco Use  Smoking Status Former   Types: Cigarettes, E-cigarettes   Quit date: 03/24/2021   Years since quitting: 0.8  Smokeless Tobacco Never    Social History        Substance and Sexual Activity  Alcohol Use No   Alcohol/week: 0.0 standard drinks of alcohol     Social History       Substance and Sexual Activity  Drug Use No           Family History  Problem Relation Age of Onset   Hypertension Mother     Ovarian cysts Mother          removed cyst on one side in 2018   Thyroid cancer Maternal Aunt     Breast cancer Paternal Aunt          ? age   Breast cancer Maternal Grandmother     Cancer Maternal Grandfather          pancreatic/esophageal   Asthma Neg Hx     Heart disease Neg Hx     Diabetes Neg Hx     Stroke Neg Hx        ALLERGIES:  Penicillins   MEDS:                    Current Outpatient Medications on File Prior to Visit  Medication Sig Dispense Refill   levothyroxine (SYNTHROID) 50 MCG tablet TAKE 1 TABLET(50 MCG) BY MOUTH DAILY BEFORE BREAKFAST 30  tablet 0   Prenatal Vit-Fe Fumarate-FA (MULTIVITAMIN-PRENATAL) 27-0.8 MG TABS tablet Take 1 tablet by mouth daily at 12 noon.       [DISCONTINUED] fluticasone (FLONASE) 50 MCG/ACT nasal spray Place 1 spray into both nostrils daily. 16 g 0    No current facility-administered medications on file prior to visit.      No orders of the defined types were placed in this encounter.     Physical examination BP 123/77   Pulse 74   Ht 5\' 8"  (1.727 m)   Wt 234 lb (106.1 kg)   Breastfeeding Yes   BMI 35.58 kg/m    General NAD, Conversant  HEENT Atraumatic; Op clear with mmm.  Normo-cephalic. Pupils reactive. Anicteric sclerae  Thyroid/Neck Smooth without nodularity or enlargement. Normal ROM.  Neck Supple.  Skin No rashes, lesions or ulceration. Normal palpated skin turgor. No nodularity.  Breasts: No masses or discharge.  Symmetric.  No axillary adenopathy.  Lungs: Clear to auscultation.No rales or wheezes. Normal Respiratory effort, no retractions.  Heart: NSR.  No murmurs or rubs appreciated. No periferal edema  Abdomen: Soft.  Non-tender.  No masses.  No HSM. No hernia  Extremities: Moves all appropriately.  Normal ROM for age. No lymphadenopathy.  Neuro: Oriented to PPT.  Normal mood. Normal affect.              Pelvic:    Vulva: Normal appearance.  No lesions.   Vagina: No lesions or abnormalities noted.   Support: Normal pelvic support.   Urethra No masses tenderness or scarring.   Meatus Normal size without lesions or prolapse.   Cervix: Normal ectropion.  No lesions.   Anus: Normal exam.  No lesions.   Perineum: Normal exam.  No lesions.         Bimanual    Uterus: Normal size.  Non-tender.  Mobile.  AV.    Adnexae: No masses.  Non-tender to palpation.    Cul-de-sac: Negative for abnormality.      Assessment:    [id number]     Patient Active Problem List    Diagnosis Date Noted   Labor  and delivery, indication for care 11/30/2021   Pap smear of cervix shows high risk HPV  present 05/07/2021   Postpartum care following vaginal delivery 02/22/2020   Encounter for care or examination of lactating mother 02/22/2020   History of Hashimoto thyroiditis 06/28/2019   Rh negative state in antepartum period 06/28/2019   Hypothyroidism 08/07/2014      1. Pre-op exam         Plan:      1.  Laparoscopic permanent sterilization

## 2022-03-01 NOTE — Anesthesia Procedure Notes (Signed)
Procedure Name: Intubation Date/Time: 03/01/2022 1:36 PM  Performed by: Willette Alma, CRNAPre-anesthesia Checklist: Patient identified, Patient being monitored, Timeout performed, Emergency Drugs available and Suction available Patient Re-evaluated:Patient Re-evaluated prior to induction Oxygen Delivery Method: Circle system utilized Preoxygenation: Pre-oxygenation with 100% oxygen Induction Type: IV induction Ventilation: Mask ventilation without difficulty Laryngoscope Size: 4 and McGraph Grade View: Grade I Tube type: Oral Tube size: 7.0 mm Number of attempts: 1 Airway Equipment and Method: Stylet Placement Confirmation: ETT inserted through vocal cords under direct vision, positive ETCO2 and breath sounds checked- equal and bilateral Secured at: 21 cm Tube secured with: Tape Dental Injury: Teeth and Oropharynx as per pre-operative assessment

## 2022-03-01 NOTE — Discharge Instructions (Signed)

## 2022-03-01 NOTE — Anesthesia Preprocedure Evaluation (Addendum)
Anesthesia Evaluation  Patient identified by MRN, date of birth, ID band Patient awake    Reviewed: Allergy & Precautions, NPO status , Patient's Chart, lab work & pertinent test results  History of Anesthesia Complications Negative for: history of anesthetic complications  Airway Mallampati: III  TM Distance: >3 FB Neck ROM: full    Dental  (+) Chipped   Pulmonary neg shortness of breath, former smoker   Pulmonary exam normal        Cardiovascular Exercise Tolerance: Good (-) angina (-) Past MI negative cardio ROS Normal cardiovascular exam     Neuro/Psych negative neurological ROS  negative psych ROS   GI/Hepatic Neg liver ROS,GERD  Controlled,,  Endo/Other  negative endocrine ROS    Renal/GU      Musculoskeletal   Abdominal   Peds  Hematology negative hematology ROS (+)   Anesthesia Other Findings Past Medical History: No date: Allergy No date: Anemia No date: GERD (gastroesophageal reflux disease)     Comment:  h/o with preganancy only No date: History of migraine 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  Anatomic [place] Normal               female Rubella: 3.58 (05/20 1121)  Varicella: Immune  GTT                Third 103 trimester:  RPR: Non Reactive (10/27 1531)                 Rhogam [x ] 28 weeks. Given 11/23 HBsAg: Negative (05/20               112  Past Surgical History: 02/09/2004: KNEE SURGERY; Right No date: TONSILLECTOMY     Comment:  At age 32 No date: WISDOM TOOTH EXTRACTION     Comment:  all four  BMI    Body Mass Index: 35.58 kg/m       Reproductive/Obstetrics (+) Breast feeding                               Anesthesia Physical Anesthesia Plan  ASA: 2  Anesthesia Plan: General ETT   Post-op Pain Management:    Induction: Intravenous  PONV Risk Score and Plan: Ondansetron, Dexamethasone, Midazolam and Treatment may vary due to age or medical condition  Airway Management Planned: Oral ETT  Additional Equipment:   Intra-op Plan:   Post-operative Plan: Extubation in OR  Informed Consent: I have reviewed the patients History and Physical, chart, labs and discussed the procedure including the risks, benefits and alternatives for the proposed anesthesia with the patient or authorized representative who has indicated his/her understanding and acceptance.     Dental Advisory Given  Plan Discussed with: Anesthesiologist, CRNA and Surgeon  Anesthesia Plan Comments: (Patient consented for risks of anesthesia including but not limited to:  - adverse reactions to medications - damage to eyes, teeth, lips or other oral mucosa - nerve  damage due to positioning  - sore throat or hoarseness - Damage to heart, brain, nerves, lungs, other parts of body or loss of life  Patient voiced understanding.)        Anesthesia Quick Evaluation

## 2022-03-01 NOTE — Op Note (Signed)
OPERATIVE NOTE 03/01/2022 1:42 PM  PRE-OPERATIVE DIAGNOSIS:  1) Desires Permanent Sterlization  POST-OPERATIVE DIAGNOSIS:  * No Diagnosis Codes entered *  OPERATION:  Laparoscopic Filshie clip tubal occlusion for sterilization  SURGEON(S): Surgeon(s) and Role:    Harlin Heys, MD - Primary   ANESTHESIA: Choice  ESTIMATED BLOOD LOSS: 18mL  SPECIMEN: * No specimens in log *  COMPLICATIONS: None  DISPOSITION: Stable to recovery room  DESCRIPTION OF PROCEDURE:      The patient was prepped and draped in the dorsolithotomy position and placed under general anesthesia. The bladder was emptied. The cervix was grasped with a multi-toothed tenaculum and a uterine manipulator was placed within the cervical os respecting the position and curvature of the uterus. After changing gloves we proceeded abdominally. A small infraumbilical incision was made and a 10 mm trocar port was placed within the abdominopelvic cavity. The opening pressure was less than 7 mmHg.  Approximately 3 and 1/2 L of carbon dioxide gas was instilled within the abdominal pelvic cavity. The laparoscope was placed and the pelvis and abdomen were carefully inspected. The right fallopian tube was identified and followed out to its fine fimbriated end and then back approximate half the way. At this point the section of tube was elevated on Filshie clip and completely occluded in a perpendicular manner. The left fallopian tube was similarly identified and again the midportion of the tube is completely occluded using a Filshie clip in a perpendicular manner. Hemostasis of the fallopian tubes as well as the pelvis was noted. The laparoscope was removed the trocar sleeve was removed and the incision was closed with a deep suture through the fascia of 0 Vicryl followed by subcuticular closure of the skin. A long-acting anesthetic was injected.  Steri-Strips were applied. The uterine manipulator was removed. Hemostasis of the  cervix was noted. The patient went to the recovery room in stable condition.   Finis Bud, M.D. 03/01/2022 1:42 PM

## 2022-03-01 NOTE — Transfer of Care (Signed)
Immediate Anesthesia Transfer of Care Note  Patient: Brenda Villegas  Procedure(s) Performed: LAPAROSCOPIC FILSHIE CLIP PLACEMENT FOR PERMANENT STERILIZATION (Bilateral: Abdomen)  Patient Location: PACU  Anesthesia Type:General  Level of Consciousness: awake, alert , and oriented  Airway & Oxygen Therapy: Patient Spontanous Breathing and Patient connected to face mask oxygen  Post-op Assessment: Report given to RN and Post -op Vital signs reviewed and stable  Post vital signs: Reviewed and stable  Last Vitals:  Vitals Value Taken Time  BP 126/76 03/01/22 1432  Temp 36.2 C 03/01/22 1432  Pulse 82 03/01/22 1435  Resp 14 03/01/22 1435  SpO2 98 % 03/01/22 1435  Vitals shown include unvalidated device data.  Last Pain:  Vitals:   03/01/22 1058  TempSrc: Temporal         Complications: No notable events documented.

## 2022-03-02 ENCOUNTER — Encounter: Payer: Self-pay | Admitting: Obstetrics and Gynecology

## 2022-03-05 NOTE — Anesthesia Postprocedure Evaluation (Signed)
Anesthesia Post Note  Patient: Brenda Villegas  Procedure(s) Performed: LAPAROSCOPIC FILSHIE CLIP PLACEMENT FOR PERMANENT STERILIZATION (Bilateral: Abdomen)  Patient location during evaluation: PACU Anesthesia Type: General Level of consciousness: awake Pain management: pain level controlled Vital Signs Assessment: post-procedure vital signs reviewed and stable Respiratory status: spontaneous breathing Cardiovascular status: stable Anesthetic complications: no  No notable events documented.   Last Vitals:  Vitals:   03/01/22 1530 03/01/22 1533  BP: 119/74   Pulse: (!) 52 (!) 57  Resp: 19   Temp: (!) 35.6 C   SpO2: 100% 100%    Last Pain:  Vitals:   03/01/22 1530  TempSrc: Temporal  PainSc: 1                  VAN STAVEREN,Deundra Furber

## 2022-03-16 ENCOUNTER — Ambulatory Visit (INDEPENDENT_AMBULATORY_CARE_PROVIDER_SITE_OTHER): Payer: Medicaid Other | Admitting: Obstetrics and Gynecology

## 2022-03-16 ENCOUNTER — Encounter: Payer: Self-pay | Admitting: Obstetrics and Gynecology

## 2022-03-16 DIAGNOSIS — Z9889 Other specified postprocedural states: Secondary | ICD-10-CM

## 2022-03-16 DIAGNOSIS — Z4889 Encounter for other specified surgical aftercare: Secondary | ICD-10-CM

## 2022-03-16 NOTE — Progress Notes (Signed)
HPI:      [name] is a 32 y.o. [phone] who LMP was No LMP recorded.  Subjective:   She presents today  2 weeks from Filshie clip tubal ligation.  She reports she is doing well.  Minimal pain.  Has resumed most normal activities.    Hx: The following portions of the patient's history were reviewed and updated as appropriate:             She  has a past medical history of Allergy, Anemia, GERD (gastroesophageal reflux disease), History of migraine, HPV in female, Hyperthyroidism (08/07/2014), and Supervision of high risk pregnancy, antepartum (06/28/2019). She does not have any pertinent problems on file. She  has a past surgical history that includes Knee surgery (Right, 02/09/2004); Tonsillectomy; Wisdom tooth extraction; and Laparoscopic tubal ligation (Bilateral, 03/01/2022). Her family history includes Breast cancer in her maternal grandmother and paternal aunt; Cancer in her maternal grandfather; Hypertension in her mother; Ovarian cysts in her mother; Thyroid cancer in her maternal aunt. She  reports that she quit smoking about a year ago. Her smoking use included e-cigarettes. She has never used smokeless tobacco. She reports that she does not drink alcohol and does not use drugs. She has a current medication list which includes the following prescription(s): acetaminophen, multivitamin-prenatal, and [DISCONTINUED] fluticasone. She is allergic to penicillins.       Review of Systems:  Review of Systems  Constitutional: Denied constitutional symptoms, night sweats, recent illness, fatigue, fever, insomnia and weight loss.  Eyes: Denied eye symptoms, eye pain, photophobia, vision change and visual disturbance.  Ears/Nose/Throat/Neck: Denied ear, nose, throat or neck symptoms, hearing loss, nasal discharge, sinus congestion and sore throat.  Cardiovascular: Denied cardiovascular symptoms, arrhythmia, chest pain/pressure, edema, exercise intolerance, orthopnea and  palpitations.  Respiratory: Denied pulmonary symptoms, asthma, pleuritic pain, productive sputum, cough, dyspnea and wheezing.  Gastrointestinal: Denied, gastro-esophageal reflux, melena, nausea and vomiting.  Genitourinary: Denied genitourinary symptoms including symptomatic vaginal discharge, pelvic relaxation issues, and urinary complaints.  Musculoskeletal: Denied musculoskeletal symptoms, stiffness, swelling, muscle weakness and myalgia.  Dermatologic: Denied dermatology symptoms, rash and scar.  Neurologic: Denied neurology symptoms, dizziness, headache, neck pain and syncope.  Psychiatric: Denied psychiatric symptoms, anxiety and depression.  Endocrine: Denied endocrine symptoms including hot flashes and night sweats.   Meds:   Current Outpatient Medications on File Prior to Visit  Medication Sig Dispense Refill   acetaminophen (TYLENOL) 325 MG tablet Take 650 mg by mouth every 6 (six) hours as needed.     Prenatal Vit-Fe Fumarate-FA (MULTIVITAMIN-PRENATAL) 27-0.8 MG TABS tablet Take 1 tablet by mouth daily at 12 noon.     [DISCONTINUED] fluticasone (FLONASE) 50 MCG/ACT nasal spray Place 1 spray into both nostrils daily. 16 g 0   No current facility-administered medications on file prior to visit.      Objective:     There were no vitals filed for this visit. There were no vitals filed for this visit.             Abdomen: Soft.  Non-tender.  No masses.  No HSM.  Incision/s: Intact.  Healing well.  No erythema.  No drainage.             Assessment:    [id number] Patient Active Problem List   Diagnosis Date Noted   Labor and delivery, indication for care 11/30/2021   Pap smear of cervix shows high risk HPV present 05/07/2021   Postpartum care following vaginal delivery 02/22/2020   Encounter  for care or examination of lactating mother 02/22/2020   History of Hashimoto thyroiditis 06/28/2019   Rh negative state in antepartum period 06/28/2019   Hypothyroidism  08/07/2014     1. Postoperative state     Patient with excellent recovery postop.  May resume normal activities.   Plan:            1.  Plan for annual examination in the next 2 months.  Patient has a history of abnormal Paps.  We will perform a Pap Co-test at that time and decide management from there.   Orders No orders of the defined types were placed in this encounter.   No orders of the defined types were placed in this encounter.     F/U  Return in about 2 months (around 05/15/2022).  Brenda Villegas, M.D. 03/16/2022 10:17 AM

## 2022-03-16 NOTE — Progress Notes (Signed)
Patient presents for 2 week postop. She states occasional pain with bending over, denies bleeding or urinary issues. No additional concerns.

## 2022-03-17 ENCOUNTER — Ambulatory Visit: Payer: Medicaid Other | Admitting: Internal Medicine

## 2022-03-17 ENCOUNTER — Encounter: Payer: Self-pay | Admitting: Internal Medicine

## 2022-03-17 VITALS — BP 120/80 | HR 67 | Ht 68.0 in | Wt 242.0 lb

## 2022-03-17 DIAGNOSIS — E063 Autoimmune thyroiditis: Secondary | ICD-10-CM

## 2022-03-17 DIAGNOSIS — E559 Vitamin D deficiency, unspecified: Secondary | ICD-10-CM | POA: Diagnosis not present

## 2022-03-17 LAB — T4, FREE: Free T4: 0.75 ng/dL (ref 0.60–1.60)

## 2022-03-17 LAB — VITAMIN D 25 HYDROXY (VIT D DEFICIENCY, FRACTURES): VITD: 8.88 ng/mL — ABNORMAL LOW (ref 30.00–100.00)

## 2022-03-17 LAB — TSH: TSH: 0.59 u[IU]/mL (ref 0.35–5.50)

## 2022-03-17 MED ORDER — VITAMIN D (ERGOCALCIFEROL) 1.25 MG (50000 UNIT) PO CAPS: 50000.0000 [IU] | ORAL_CAPSULE | ORAL | 2 refills | Status: DC

## 2022-03-17 NOTE — Progress Notes (Signed)
Name: Brenda Villegas  MRN/ DOB: [id number], [birth year]    Age/ Sex: 32 y.o., female    PCP: Pcp, No   Reason for Endocrinology Evaluation: Hashimoto's Disease      Date of Initial Endocrinology Evaluation: 03/17/2022     HPI: Brenda Villegas is a 32 y.o. female with a past medical history of Hashimoto's disease. The patient presented for initial endocrinology clinic visit on 03/17/2022 for consultative assistance with her Hashimoto's disease.   Patient has been diagnosed with Hashimoto's disease in 2016 with an elevated TPO antibody of 241 IU/mL.  This was after the delivery of her first son, she was noted with a suppressed TSH in 2016 which was a few months postpartum.  She was also noted to have an elevated TSH at 7.72 u IU/mL 09/2020 The patient required LT-4 replacement during her third pregnancy in 2023.  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  Denies constipation    Had tubal ligation 2 weeks  She is nursing  She is taking prenatals   Maternal grandma with thyroid disease, cousin required             HISTORY:  Past Medical History:  Past Medical History:  Diagnosis Date   Allergy    Anemia    GERD (gastroesophageal reflux disease)    h/o with preganancy only   History of migraine    HPV in female    Hyperthyroidism 08/07/2014   Supervision of high risk pregnancy, antepartum 06/28/2019   Clinic [facility] Prenatal Labs  Dating 6 week ultrasound Blood type: O/Negative/-- (05/20 1121)   Genetic Screen Declines AFP: [ ]  Antibody:Negative (10/27 1531) positive on 1/14- weak Anti D antibody noted.  Anatomic [place] Normal female Rubella: 3.58 (05/20 1121)  Varicella: Immune  GTT   Third 103 trimester:   RPR: Non Reactive (10/27 1531)   Rhogam [x ] 28 weeks. Given 11/23 HBsAg: Negative (05/20 112   Past Surgical History:  Past Surgical History:  Procedure Laterality Date   KNEE SURGERY Right 02/09/2004   LAPAROSCOPIC TUBAL LIGATION Bilateral 03/01/2022   Procedure: LAPAROSCOPIC FILSHIE CLIP PLACEMENT FOR PERMANENT STERILIZATION;  Surgeon: Harlin Heys, MD;  Location: [facility] ORS;  Service: Gynecology;  Laterality: Bilateral;  [id number]   TONSILLECTOMY     At age 18   WISDOM TOOTH EXTRACTION     all four    Social History:  reports that she quit smoking about a year ago. Her smoking use included e-cigarettes. She has never used smokeless tobacco. She reports that she does not drink alcohol and does not use drugs. Family History: family history includes Breast cancer in her maternal grandmother and paternal aunt; Cancer in her maternal grandfather; Hypertension in her mother; Ovarian cysts in her mother; Thyroid cancer in her maternal aunt.   HOME MEDICATIONS: Allergies as of 03/17/2022       Reactions   Penicillins Hives        Medication List        Accurate as of March 17, 2022  4:07 PM. If you have any questions, ask your nurse or doctor.          acetaminophen 325 MG tablet Commonly  known as: TYLENOL Take 650 mg by mouth every 6 (six) hours as needed.   multivitamin-prenatal 27-0.8 MG Tabs tablet Take 1 tablet by mouth daily at 12 noon.   Vitamin D (Ergocalciferol) 1.25 MG (50000 UNIT) Caps capsule Commonly known as: DRISDOL Take 1 capsule (50,000 Units total) by mouth every 7 (seven) days. Started by: Dorita Sciara, MD          REVIEW OF SYSTEMS: A comprehensive ROS was conducted with the patient and is negative except as per HPI     OBJECTIVE:  VS: BP 120/80 (BP Location: Left Arm, Patient Position: Sitting, Cuff Size: Large)   Pulse 67   Ht 5\' 8"  (1.727 m)   Wt 242 lb (109.8 kg)   SpO2 97%   BMI 36.80 kg/m    Wt Readings from Last 3  Encounters:  03/17/22 242 lb (109.8 kg)  03/01/22 234 lb (106.1 kg)  01/21/22 234 lb (106.1 kg)     EXAM: General: Pt appears well and is in NAD  Eyes: External eye exam normal without stare, lid lag or exophthalmos.   Neck: General: Supple without adenopathy. Thyroid: Thyroid size normal.  No goiter or nodules appreciated.   Lungs: Clear with good BS bilat   Heart: Auscultation: RRR.  Abdomen: Normoactive bowel sounds, soft, nontender, without masses or organomegaly palpable  Extremities:  BL LE: No pretibial edema normal ROM and strength.  Mental Status: Judgment, insight: Intact Orientation: Oriented to time, place, and person Mood and affect: No depression, anxiety, or agitation     DATA REVIEWED:     Latest Reference Range & Units 03/17/22 13:41  TSH 0.35 - 5.50 uIU/mL 0.59  T4,Free(Direct) 0.60 - 1.60 ng/dL 0.75    Latest Reference Range & Units 03/17/22 13:41  VITD 30.00 - 100.00 ng/mL 8.88 (L)    ASSESSMENT/PLAN/RECOMMENDATIONS:   Hashimoto's Disease:  -No local neck symptoms -She required levothyroxine 50 mcg during her third pregnancy in 2023.  She has not required any LT-4 replacement since delivery -Repeat TFTs are normal -I explained to the patient that Hashimoto's Disease is an autoimmune - mediated destruction of the thyroid gland. The usual course of Hashimoto's thyroiditis is the gradual loss of thyroid function.  -No need for thyroid hormone replacement at this time   2.  Vitamin D deficiency:  -Vitamin D extremely low we will replenish  Medication  Start ergocalciferol 50,000 IU weekly  Follow-up in 6 months  Signed electronically by: Mack Guise, MD  [facility]  [facility] [address] [place], [place] [address] Phone: [phone] FAX: [phone]   CC: Pcp, No No address on file Phone: None Fax: None   Return to Endocrinology clinic as below: Future Appointments  Date Time  Provider [place]  09/17/2022 10:10 AM Sonnia Strong, Melanie Crazier, MD [facility] None

## 2022-03-18 LAB — T3: T3, Total: 135 ng/dL (ref 76–181)

## 2022-04-09 HISTORY — PX: CHOLECYSTECTOMY: SHX55

## 2022-04-26 ENCOUNTER — Telehealth: Payer: Self-pay | Admitting: Obstetrics and Gynecology

## 2022-04-26 NOTE — Telephone Encounter (Signed)
This patient is calling today with complaint of leaking from belly button. The patient states she has a brownish/ red dryness once it dries. I offered the patient an opening on 3/20 at 8:30 with Dr. Amalia Hailey, but the patient states her son has an important appointment that day that she could not rescheduled. Please advise?

## 2022-04-26 NOTE — Telephone Encounter (Signed)
I contacted the patient via phone. I advised patient we would need to scheduled for follow up with Pap smear per recall with [facility]E last note.I advised due to no opening to schedule in  next 2 weeks per nurse advising to be seen by PCP or urgent care. I have the patient scheduled for 4/11 with DJE for follow up on belly button leaking fluids and pap smear. The patient has been added to cancellation list if something come open sooner we will call her. The patient advised understanding.

## 2022-04-27 ENCOUNTER — Observation Stay
Admission: EM | Admit: 2022-04-27 | Discharge: 2022-04-28 | Disposition: A | Discharge status: Home / Self Care | Payer: Medicaid Other | Attending: Surgery | Admitting: Surgery

## 2022-04-27 ENCOUNTER — Other Ambulatory Visit: Payer: Self-pay

## 2022-04-27 ENCOUNTER — Encounter: Payer: Self-pay | Admitting: *Deleted

## 2022-04-27 DIAGNOSIS — X58XXXA Exposure to other specified factors, initial encounter: Secondary | ICD-10-CM | POA: Diagnosis not present

## 2022-04-27 DIAGNOSIS — S31105A Unspecified open wound of abdominal wall, periumbilic region without penetration into peritoneal cavity, initial encounter: Secondary | ICD-10-CM

## 2022-04-27 DIAGNOSIS — Z79899 Other long term (current) drug therapy: Secondary | ICD-10-CM | POA: Diagnosis not present

## 2022-04-27 DIAGNOSIS — K819 Cholecystitis, unspecified: Secondary | ICD-10-CM

## 2022-04-27 DIAGNOSIS — K8010 Calculus of gallbladder with chronic cholecystitis without obstruction: Principal | ICD-10-CM | POA: Insufficient documentation

## 2022-04-27 DIAGNOSIS — D360 Benign neoplasm of lymph nodes: Secondary | ICD-10-CM | POA: Diagnosis not present

## 2022-04-27 DIAGNOSIS — K810 Acute cholecystitis: Secondary | ICD-10-CM | POA: Diagnosis present

## 2022-04-27 DIAGNOSIS — Z87891 Personal history of nicotine dependence: Secondary | ICD-10-CM | POA: Diagnosis not present

## 2022-04-27 DIAGNOSIS — Z7952 Long term (current) use of systemic steroids: Secondary | ICD-10-CM | POA: Insufficient documentation

## 2022-04-27 DIAGNOSIS — R1013 Epigastric pain: Secondary | ICD-10-CM | POA: Diagnosis present

## 2022-04-27 LAB — CBC
HCT: 40.1 % (ref 36.0–46.0)
Hemoglobin: 13.1 g/dL (ref 12.0–15.0)
MCH: 26.4 pg (ref 26.0–34.0)
MCHC: 32.7 g/dL (ref 30.0–36.0)
MCV: 80.8 fL (ref 80.0–100.0)
Platelets: 242 10*3/uL (ref 150–400)
RBC: 4.96 MIL/uL (ref 3.87–5.11)
RDW: 13.9 % (ref 11.5–15.5)
WBC: 10.5 10*3/uL (ref 4.0–10.5)
nRBC: 0 % (ref 0.0–0.2)

## 2022-04-27 LAB — COMPREHENSIVE METABOLIC PANEL
ALT: 23 U/L (ref 0–44)
AST: 41 U/L (ref 15–41)
Albumin: 3.7 g/dL (ref 3.5–5.0)
Alkaline Phosphatase: 186 U/L — ABNORMAL HIGH (ref 38–126)
Anion gap: 10 (ref 5–15)
BUN: 12 mg/dL (ref 6–20)
CO2: 22 mmol/L (ref 22–32)
Calcium: 9 mg/dL (ref 8.9–10.3)
Chloride: 106 mmol/L (ref 98–111)
Creatinine, Ser: 0.82 mg/dL (ref 0.44–1.00)
GFR, Estimated: >60 mL/min (ref >60)
Glucose, Bld: 109 mg/dL — ABNORMAL HIGH (ref 70–99)
Potassium: 3.3 mmol/L — ABNORMAL LOW (ref 3.5–5.1)
Sodium: 138 mmol/L (ref 135–145)
Total Bilirubin: 0.5 mg/dL (ref 0.3–1.2)
Total Protein: 7.7 g/dL (ref 6.5–8.1)

## 2022-04-27 LAB — URINALYSIS, ROUTINE W REFLEX MICROSCOPIC
Bilirubin Urine: NEGATIVE
Glucose, UA: NEGATIVE mg/dL
Hgb urine dipstick: NEGATIVE
Ketones, ur: NEGATIVE mg/dL
Nitrite: NEGATIVE
Protein, ur: NEGATIVE mg/dL
Specific Gravity, Urine: 1.025 (ref 1.005–1.030)
pH: 5 (ref 5.0–8.0)

## 2022-04-27 LAB — LIPASE, BLOOD: Lipase: 46 U/L (ref 11–51)

## 2022-04-27 LAB — POC URINE PREG, ED: Preg Test, Ur: NEGATIVE

## 2022-04-27 MED ORDER — OXYCODONE HCL 5 MG PO TABS
5.0000 mg | ORAL_TABLET | Freq: Once | ORAL | Status: DC
  Filled 2022-04-27: qty 1

## 2022-04-27 MED ORDER — ACETAMINOPHEN 500 MG PO TABS
1000.0000 mg | ORAL_TABLET | Freq: Once | ORAL | Status: AC
  Administered 2022-04-27: 1000 mg via ORAL
  Filled 2022-04-27: qty 2

## 2022-04-27 MED ORDER — ONDANSETRON 4 MG PO TBDP
4.0000 mg | ORAL_TABLET | Freq: Once | ORAL | Status: DC
  Filled 2022-04-27: qty 1

## 2022-04-27 NOTE — ED Triage Notes (Signed)
Pt has abd pain for 30 minutes.  No v/d.  Pain radiates into mid back.  Pt alert  speech clear.

## 2022-04-27 NOTE — ED Provider Notes (Signed)
[facility] Provider Note    Event Date/Time   First MD Initiated Contact with Patient 04/27/22 2300     (approximate)   History   Abdominal Pain   HPI  Brenda Villegas is a 32 y.o. female who presents to the ED for evaluation of Abdominal Pain   I review OB clinic visit from 2/6.  Obese G3, P3 who had a tubal ligation with [place] clip performed on 1/22.  Otherwise history of GERD  Patient presents to the ED for evaluation of a few hours of epigastric pain.  Pain started around 7 PM, approximately 30 minutes after eating "taco Tuesday" dinner with her husband.  Pain has been persistent since that time.  No coexisting symptoms such as nausea, emesis, dyspnea, chest pain or anything beyond the pain.  No intra-abdominal surgical history aside from the tubal ligation.    Physical Exam   Triage Vital Signs: ED Triage Vitals  Enc Vitals Group     BP 04/27/22 1948 137/82     Pulse Rate 04/27/22 1948 72     Resp 04/27/22 1948 (!) 22     Temp 04/27/22 1948 97.7 F (36.5 C)     Temp Source 04/27/22 1948 Oral     SpO2 04/27/22 1948 100 %     Weight 04/27/22 1945 240 lb (108.9 kg)     Height 04/27/22 1945 5\' 8"  (1.727 m)     Head Circumference --      Peak Flow --      Pain Score 04/27/22 1945 8     Pain Loc --      Pain Edu? --      Excl. in [place]? --     Most recent vital signs: Vitals:   04/28/22 0538 04/28/22 0539  BP:    Pulse:  61  Resp:    Temp: 97.8 F (36.6 C)   SpO2:  100%    General: Awake, no distress.  CV:  Good peripheral perfusion.  Resp:  Normal effort.  Abd:  No distention.  RUQ tenderness with some voluntary guarding on deeper palpation but no peritoneal features throughout.  Abdomen is otherwise benign MSK:  No deformity noted.  Neuro:  No focal deficits appreciated. Other:     ED Results / Procedures / Treatments   Labs (all labs ordered are listed, but only abnormal results are displayed) Labs Reviewed   COMPREHENSIVE METABOLIC PANEL - Abnormal; Notable for the following components:      Result Value   Potassium 3.3 (*)    Glucose, Bld 109 (*)    Alkaline Phosphatase 186 (*)    All other components within normal limits  URINALYSIS, ROUTINE W REFLEX MICROSCOPIC - Abnormal; Notable for the following components:   Color, Urine YELLOW (*)    APPearance HAZY (*)    Leukocytes,Ua TRACE (*)    Bacteria, UA FEW (*)    All other components within normal limits  LIPASE, BLOOD  CBC  POC URINE PREG, ED    EKG   RADIOLOGY   Official radiology report(s): MR ABDOMEN MRCP W WO CONTAST  Result Date: 04/28/2022 CLINICAL DATA:  32 year old female with history of cholelithiasis and clinical concern for possible choledocholithiasis. EXAM: MRI ABDOMEN WITHOUT AND WITH CONTRAST (INCLUDING MRCP) TECHNIQUE: Multiplanar multisequence MR imaging of the abdomen was performed both before and after the administration of intravenous contrast. Heavily T2-weighted images of the biliary and pancreatic ducts were obtained, and three-dimensional MRCP images were rendered  by post processing. CONTRAST:  72mL GADAVIST GADOBUTROL 1 MMOL/ML IV SOLN COMPARISON:  No prior abdominal MRI. Abdominal ultrasound 04/28/2022. FINDINGS: Lower chest: Unremarkable. Hepatobiliary: No suspicious cystic or solid hepatic lesions. Multiple tiny filling defects lie dependently in the gallbladder. Gallbladder is moderately distended with thickened and edematous wall measuring up to 5 mm. Trace volume of pericholecystic fluid adjacent to the fundus. MRCP images demonstrate no intra or extrahepatic biliary ductal dilatation. Common bile duct measures 6 mm in the porta hepatis. No filling defects in the common bile duct to suggest choledocholithiasis. Pancreas: No pancreatic mass. No pancreatic ductal dilatation. No pancreatic or peripancreatic fluid collections or inflammatory changes. Spleen:  Unremarkable. Adrenals/Urinary Tract: Bilateral kidneys  and adrenal glands are normal in appearance. No hydroureteronephrosis the visualized portions of the abdomen. Stomach/Bowel: Visualized portions are unremarkable. Vascular/Lymphatic: No aneurysm identified in the visualized abdominal vasculature. No lymphadenopathy noted in the abdomen. Other: Trace amount of fluid adjacent to the inferior margin of the right lobe of the liver. No other significant volume of ascites noted in the visualized portions of the peritoneal cavity. Musculoskeletal: No aggressive appearing osseous lesions are noted in the visualized portions of the skeleton. IMPRESSION: 1. Study is positive for cholelithiasis with imaging findings concerning for potential early acute cholecystitis, including moderate gallbladder distension, mild gallbladder wall thickening and edema, and trace volume of pericholecystic fluid. 2. No choledocholithiasis or imaging findings to suggest biliary tract obstruction. Electronically Signed   By: Vinnie Langton M.D.   On: 04/28/2022 05:13   US ABDOMEN LIMITED RUQ (LIVER/GB)  Result Date: 04/28/2022 CLINICAL DATA:  [id number]. Right upper quadrant abdominal pain onset tonight. EXAM: ULTRASOUND ABDOMEN LIMITED RIGHT UPPER QUADRANT COMPARISON:  None Available. FINDINGS: Gallbladder: There is dilatation of the gallbladder after 12 cm in length. There are several layering stones in the distal gallbladder up to 7 mm but no wall thickening, pericholecystic fluid, or positive sonographic Murphy's sign. Common bile duct: Diameter: 6.1 mm, technically is upper limit of normal but could be prominent for a 32 year old. No intrahepatic biliary dilatation is seen. Correlate clinically for biliary stasis and if clinically indicated, MRCP may be helpful. Liver: No focal lesion identified. There is increased hepatic echogenicity of steatosis. Portal vein is patent on color Doppler imaging with normal direction of blood flow towards the liver. The hepatic portal vein is slightly  prominent measuring 14.3 mm caliber. Other: None. IMPRESSION: 1. Dilatation of the gallbladder with several layering stones but no wall thickening, pericholecystic fluid, or positive sonographic Murphy's sign. 2. Common bile duct measures 6.1 mm, technically is upper limit of normal but could be prominent for a 32 year old. No intrahepatic biliary dilatation is seen. Correlate clinically for biliary stasis and if clinically warranted, MRCP may be helpful. 3. Increased hepatic echogenicity of steatosis. 4. Slightly prominent portal vein without flow reversal. Electronically Signed   By: Telford Nab M.D.   On: 04/28/2022 00:49    PROCEDURES and INTERVENTIONS:  Procedures  Medications  ondansetron (ZOFRAN-ODT) disintegrating tablet 4 mg (4 mg Oral Not Given 04/27/22 2359)  oxyCODONE (Oxy IR/ROXICODONE) immediate release tablet 5 mg (5 mg Oral Patient Refused/Not Given 04/27/22 2358)  lactated ringers infusion ( Intravenous New Bag/Given 04/28/22 [id number])  acetaminophen (TYLENOL) tablet 1,000 mg (1,000 mg Oral Given 04/28/22 0643)  HYDROmorphone (DILAUDID) injection 0.5 mg (has no administration in time range)  polyethylene glycol (MIRALAX / GLYCOLAX) packet 17 g (has no administration in time range)  ondansetron (ZOFRAN-ODT) disintegrating tablet 4 mg (has no administration in  time range)    Or  ondansetron (ZOFRAN) injection 4 mg (has no administration in time range)  pantoprazole (PROTONIX) injection 40 mg (has no administration in time range)  ciprofloxacin (CIPRO) IVPB 400 mg (400 mg Intravenous New Bag/Given 04/28/22 0719)    And  metroNIDAZOLE (FLAGYL) IVPB 500 mg (has no administration in time range)  acetaminophen (TYLENOL) tablet 1,000 mg (1,000 mg Oral Given 04/27/22 2359)  lactated ringers bolus 1,000 mL (1,000 mLs Intravenous New Bag/Given 04/28/22 0234)  fentaNYL (SUBLIMAZE) injection 50 mcg (50 mcg Intravenous Given 04/28/22 0232)  gadobutrol (GADAVIST) 1 MMOL/ML injection 10 mL (10 mLs  Intravenous Contrast Given 04/28/22 0158)     IMPRESSION / MDM / ASSESSMENT AND PLAN / ED COURSE  I reviewed the triage vital signs and the nursing notes.  Differential diagnosis includes, but is not limited to, GERD, cholelithiasis, cholecystitis, pancreatitis, gastritis  {Patient presents with symptoms of an acute illness or injury that is potentially life-threatening.  32 year old female presents with upper abdominal pain and evidence of cholecystitis requiring surgical admission.  Mild localized pain without peritoneal features.  Blood work with elevated alk phos but otherwise normal LFTs.  Normal white count and lipase.  RUQ ultrasound with fairly large CBD and with her persistent pain we obtained an MRCP with evidence of cholecystitis but no choledocholithiasis.  Admitted to surgery for cholecystectomy.  Clinical Course as of 04/28/22 0747  Wed Apr 28, 2022  0105 Reassessed and discussed ultrasound results.  She is pacing the room and still quite uncomfortable.  I am clinically concerned about choledocholithiasis and I expressed these concerns to her and she is in agreement with pursuing MRCP. [DS]  0345 Called over to MRI because we still do not have a read.  Apparently these do get read overnight and they will send it to the radiologist now.  Should have reviewed soon [DS]  [id number] Reassessed.  Still awaiting MRI read.  No needs [DS]  0520 Updated patient of MRI results and I reexamined.  Still persistently tender without peritoneal features. [DS]  [id number] I consult surgery who agrees to admit. [DS]    Clinical Course User Index [DS] Vladimir Crofts, MD     FINAL CLINICAL IMPRESSION(S) / ED DIAGNOSES   Final diagnoses:  Cholecystitis     Rx / DC Orders   ED Discharge Orders     None        Note:  This document was prepared using Dragon voice recognition software and may include unintentional dictation errors.   Vladimir Crofts, MD 04/28/22 [phone]

## 2022-04-27 NOTE — ED Notes (Signed)
Poct pregnancy Negative 

## 2022-04-28 ENCOUNTER — Other Ambulatory Visit: Payer: Self-pay

## 2022-04-28 ENCOUNTER — Encounter
Admission: EM | Disposition: A | Discharge status: Home / Self Care | Payer: Self-pay | Source: Home / Self Care | Attending: Emergency Medicine

## 2022-04-28 ENCOUNTER — Observation Stay: Payer: Medicaid Other | Admitting: Registered Nurse

## 2022-04-28 ENCOUNTER — Emergency Department: Payer: Medicaid Other

## 2022-04-28 DIAGNOSIS — K810 Acute cholecystitis: Secondary | ICD-10-CM

## 2022-04-28 DIAGNOSIS — S31105A Unspecified open wound of abdominal wall, periumbilic region without penetration into peritoneal cavity, initial encounter: Secondary | ICD-10-CM | POA: Diagnosis not present

## 2022-04-28 SURGERY — CHOLECYSTECTOMY, ROBOT-ASSISTED, LAPAROSCOPIC
Anesthesia: General | Site: Abdomen

## 2022-04-28 MED ORDER — BUPIVACAINE LIPOSOME 1.3 % IJ SUSP
INTRAMUSCULAR | Status: AC
  Filled 2022-04-28: qty 10

## 2022-04-28 MED ORDER — ACETAMINOPHEN 500 MG PO TABS
1000.0000 mg | ORAL_TABLET | Freq: Four times a day (QID) | ORAL | Status: DC
  Administered 2022-04-28: 1000 mg via ORAL
  Filled 2022-04-28: qty 2

## 2022-04-28 MED ORDER — SILVER NITRATE-POT NITRATE 75-25 % EX MISC
CUTANEOUS | Status: AC
  Filled 2022-04-28: qty 10

## 2022-04-28 MED ORDER — KETOROLAC TROMETHAMINE 15 MG/ML IJ SOLN
INTRAMUSCULAR | Status: DC | PRN
  Administered 2022-04-28: 30 mg via INTRAMUSCULAR

## 2022-04-28 MED ORDER — PROPOFOL 10 MG/ML IV BOLUS
INTRAVENOUS | Status: DC | PRN
  Administered 2022-04-28: 200 mg via INTRAVENOUS

## 2022-04-28 MED ORDER — IBUPROFEN 600 MG PO TABS: 600.0000 mg | ORAL_TABLET | Freq: Three times a day (TID) | ORAL | 1 refills | Status: DC | PRN

## 2022-04-28 MED ORDER — EPHEDRINE 5 MG/ML INJ
INTRAVENOUS | Status: AC
  Filled 2022-04-28: qty 5

## 2022-04-28 MED ORDER — OXYCODONE HCL 5 MG PO TABS
ORAL_TABLET | ORAL | Status: AC
  Filled 2022-04-28: qty 1

## 2022-04-28 MED ORDER — ONDANSETRON HCL 4 MG/2ML IJ SOLN
4.0000 mg | Freq: Four times a day (QID) | INTRAMUSCULAR | Status: DC | PRN
  Administered 2022-04-28: 4 mg via INTRAVENOUS

## 2022-04-28 MED ORDER — PROPOFOL 10 MG/ML IV BOLUS
INTRAVENOUS | Status: AC
  Filled 2022-04-28: qty 20

## 2022-04-28 MED ORDER — HYDROMORPHONE HCL 1 MG/ML IJ SOLN
INTRAMUSCULAR | Status: AC
  Filled 2022-04-28: qty 1

## 2022-04-28 MED ORDER — LIDOCAINE HCL (CARDIAC) PF 100 MG/5ML IV SOSY
PREFILLED_SYRINGE | INTRAVENOUS | Status: DC | PRN
  Administered 2022-04-28: 40 mg via INTRAVENOUS

## 2022-04-28 MED ORDER — POLYETHYLENE GLYCOL 3350 17 G PO PACK: 17.0000 g | PACK | Freq: Every day | ORAL | Status: DC | PRN

## 2022-04-28 MED ORDER — HYDROMORPHONE HCL 1 MG/ML IJ SOLN
INTRAMUSCULAR | Status: DC | PRN
  Administered 2022-04-28: 1 mg via INTRAVENOUS

## 2022-04-28 MED ORDER — MIDAZOLAM HCL 2 MG/2ML IJ SOLN
INTRAMUSCULAR | Status: AC
  Filled 2022-04-28: qty 2

## 2022-04-28 MED ORDER — METRONIDAZOLE 500 MG/100ML IV SOLN
500.0000 mg | Freq: Two times a day (BID) | INTRAVENOUS | Status: DC
  Administered 2022-04-28: 500 mg via INTRAVENOUS
  Filled 2022-04-28 (×2): qty 100

## 2022-04-28 MED ORDER — LACTATED RINGERS IV BOLUS
1000.0000 mL | Freq: Once | INTRAVENOUS | Status: AC
  Administered 2022-04-28: 1000 mL via INTRAVENOUS

## 2022-04-28 MED ORDER — FENTANYL CITRATE (PF) 100 MCG/2ML IJ SOLN
INTRAMUSCULAR | Status: AC
  Filled 2022-04-28: qty 2

## 2022-04-28 MED ORDER — ONDANSETRON 4 MG PO TBDP: 4.0000 mg | ORAL_TABLET | Freq: Four times a day (QID) | ORAL | Status: DC | PRN

## 2022-04-28 MED ORDER — BUPIVACAINE-EPINEPHRINE 0.25% -1:200000 IJ SOLN
INTRAMUSCULAR | Status: DC | PRN
  Administered 2022-04-28: 40 mL

## 2022-04-28 MED ORDER — EPHEDRINE SULFATE (PRESSORS) 50 MG/ML IJ SOLN
INTRAMUSCULAR | Status: DC | PRN
  Administered 2022-04-28: 5 mg via INTRAVENOUS

## 2022-04-28 MED ORDER — KETOROLAC TROMETHAMINE 30 MG/ML IJ SOLN: 30.0000 mg | Freq: Four times a day (QID) | INTRAMUSCULAR | Status: DC

## 2022-04-28 MED ORDER — SODIUM CHLORIDE (PF) 0.9 % IJ SOLN
INTRAMUSCULAR | Status: AC
  Filled 2022-04-28: qty 10

## 2022-04-28 MED ORDER — ONDANSETRON HCL 4 MG/2ML IJ SOLN
INTRAMUSCULAR | Status: AC
  Filled 2022-04-28: qty 2

## 2022-04-28 MED ORDER — ROCURONIUM BROMIDE 100 MG/10ML IV SOLN
INTRAVENOUS | Status: DC | PRN
  Administered 2022-04-28: 60 mg via INTRAVENOUS

## 2022-04-28 MED ORDER — DEXAMETHASONE SODIUM PHOSPHATE 10 MG/ML IJ SOLN
INTRAMUSCULAR | Status: AC
  Filled 2022-04-28: qty 1

## 2022-04-28 MED ORDER — DROPERIDOL 2.5 MG/ML IJ SOLN: 0.6250 mg | Freq: Once | INTRAMUSCULAR | Status: DC | PRN

## 2022-04-28 MED ORDER — DEXAMETHASONE SODIUM PHOSPHATE 10 MG/ML IJ SOLN
INTRAMUSCULAR | Status: DC | PRN
  Administered 2022-04-28 (×2): 5 mg via INTRAVENOUS

## 2022-04-28 MED ORDER — ROCURONIUM BROMIDE 10 MG/ML (PF) SYRINGE
PREFILLED_SYRINGE | INTRAVENOUS | Status: AC
  Filled 2022-04-28: qty 10

## 2022-04-28 MED ORDER — INDOCYANINE GREEN 25 MG IV SOLR
2.5000 mg | INTRAVENOUS | Status: AC
  Administered 2022-04-28: 2.5 mg via INTRAVENOUS
  Filled 2022-04-28: qty 1

## 2022-04-28 MED ORDER — SILVER NITRATE-POT NITRATE 75-25 % EX MISC
CUTANEOUS | Status: DC | PRN
  Administered 2022-04-28: 1 via TOPICAL

## 2022-04-28 MED ORDER — MIDAZOLAM HCL 2 MG/2ML IJ SOLN
INTRAMUSCULAR | Status: DC | PRN
  Administered 2022-04-28: 2 mg via INTRAVENOUS

## 2022-04-28 MED ORDER — PIPERACILLIN-TAZOBACTAM 3.375 G IVPB 30 MIN: 3.3750 g | Freq: Once | INTRAVENOUS | Status: DC

## 2022-04-28 MED ORDER — SUGAMMADEX SODIUM 200 MG/2ML IV SOLN
INTRAVENOUS | Status: DC | PRN
  Administered 2022-04-28: 200 mg via INTRAVENOUS

## 2022-04-28 MED ORDER — OXYCODONE HCL 5 MG/5ML PO SOLN: 5.0000 mg | Freq: Once | ORAL | Status: AC | PRN

## 2022-04-28 MED ORDER — OXYCODONE HCL 5 MG PO TABS
5.0000 mg | ORAL_TABLET | Freq: Once | ORAL | Status: AC | PRN
  Administered 2022-04-28: 5 mg via ORAL

## 2022-04-28 MED ORDER — OXYCODONE HCL 5 MG PO TABS: 5.0000 mg | ORAL_TABLET | ORAL | 0 refills | Status: DC | PRN

## 2022-04-28 MED ORDER — PANTOPRAZOLE SODIUM 40 MG IV SOLR: 40.0000 mg | Freq: Every day | INTRAVENOUS | Status: DC

## 2022-04-28 MED ORDER — OXYCODONE HCL 5 MG PO TABS: 5.0000 mg | ORAL_TABLET | ORAL | Status: DC | PRN

## 2022-04-28 MED ORDER — FENTANYL CITRATE (PF) 100 MCG/2ML IJ SOLN
25.0000 ug | INTRAMUSCULAR | Status: DC | PRN
  Administered 2022-04-28 (×2): 25 ug via INTRAVENOUS

## 2022-04-28 MED ORDER — ACETAMINOPHEN 500 MG PO TABS: 1000.0000 mg | ORAL_TABLET | Freq: Four times a day (QID) | ORAL | Status: DC | PRN

## 2022-04-28 MED ORDER — LACTATED RINGERS IV SOLN: INTRAVENOUS | Status: DC

## 2022-04-28 MED ORDER — FENTANYL CITRATE PF 50 MCG/ML IJ SOSY
50.0000 ug | PREFILLED_SYRINGE | Freq: Once | INTRAMUSCULAR | Status: AC
  Administered 2022-04-28: 50 ug via INTRAVENOUS
  Filled 2022-04-28: qty 1

## 2022-04-28 MED ORDER — FENTANYL CITRATE (PF) 100 MCG/2ML IJ SOLN
INTRAMUSCULAR | Status: DC | PRN
  Administered 2022-04-28 (×2): 50 ug via INTRAVENOUS

## 2022-04-28 MED ORDER — GADOBUTROL 1 MMOL/ML IV SOLN
10.0000 mL | Freq: Once | INTRAVENOUS | Status: AC | PRN
  Administered 2022-04-28: 10 mL via INTRAVENOUS

## 2022-04-28 MED ORDER — HYDROMORPHONE HCL 1 MG/ML IJ SOLN: 0.5000 mg | INTRAMUSCULAR | Status: DC | PRN

## 2022-04-28 MED ORDER — DEXMEDETOMIDINE HCL IN NACL 200 MCG/50ML IV SOLN
INTRAVENOUS | Status: DC | PRN
  Administered 2022-04-28: 8 ug via INTRAVENOUS
  Administered 2022-04-28: 4 ug via INTRAVENOUS
  Administered 2022-04-28: 8 ug via INTRAVENOUS

## 2022-04-28 MED ORDER — LACTATED RINGERS IV SOLN: INTRAVENOUS | Status: DC | PRN

## 2022-04-28 MED ORDER — CIPROFLOXACIN IN D5W 400 MG/200ML IV SOLN
400.0000 mg | Freq: Two times a day (BID) | INTRAVENOUS | Status: DC
  Administered 2022-04-28: 400 mg via INTRAVENOUS
  Filled 2022-04-28 (×3): qty 200

## 2022-04-28 SURGICAL SUPPLY — 55 items
ADH SKN CLS APL DERMABOND .7 (GAUZE/BANDAGES/DRESSINGS) ×1
BAG PRESSURE INF REUSE 1000 (BAG) IMPLANT
CANNULA CAP OBTURATR AIRSEAL 8 (CAP) IMPLANT
CANNULA REDUC XI 12-8 STAPL (CANNULA) ×1
CANNULA REDUCER 12-8 DVNC XI (CANNULA) ×2 IMPLANT
CLIP LIGATING HEMO O LOK GREEN (MISCELLANEOUS) ×2 IMPLANT
DERMABOND ADVANCED .7 DNX12 (GAUZE/BANDAGES/DRESSINGS) ×2 IMPLANT
DRAPE ARM DVNC X/XI (DISPOSABLE) ×8 IMPLANT
DRAPE COLUMN DVNC XI (DISPOSABLE) ×2 IMPLANT
DRAPE DA VINCI XI ARM (DISPOSABLE) ×4
DRAPE DA VINCI XI COLUMN (DISPOSABLE) ×1
ELECT CAUTERY BLADE TIP 2.5 (TIP) ×1
ELECT REM PT RETURN 9FT ADLT (ELECTROSURGICAL) ×1
ELECTRODE CAUTERY BLDE TIP 2.5 (TIP) ×2 IMPLANT
ELECTRODE REM PT RTRN 9FT ADLT (ELECTROSURGICAL) ×2 IMPLANT
GAUZE SPONGE 2X2 8PLY STRL LF (GAUZE/BANDAGES/DRESSINGS) IMPLANT
GLOVE SURG SYN 7.0 (GLOVE) ×2 IMPLANT
GLOVE SURG SYN 7.0 PF PI (GLOVE) ×4 IMPLANT
GLOVE SURG SYN 7.5  E (GLOVE) ×2
GLOVE SURG SYN 7.5 E (GLOVE) ×2 IMPLANT
GLOVE SURG SYN 7.5 PF PI (GLOVE) ×4 IMPLANT
GOWN STRL REUS W/ TWL LRG LVL3 (GOWN DISPOSABLE) ×8 IMPLANT
GOWN STRL REUS W/TWL LRG LVL3 (GOWN DISPOSABLE) ×4
IRRIGATOR SUCT 8 DISP DVNC XI (IRRIGATION / IRRIGATOR) IMPLANT
IRRIGATOR SUCTION 8MM XI DISP (IRRIGATION / IRRIGATOR)
IV NS 1000ML (IV SOLUTION)
IV NS 1000ML BAXH (IV SOLUTION) IMPLANT
KIT PINK PAD W/HEAD ARE REST (MISCELLANEOUS) ×1 IMPLANT
KIT PINK PAD W/HEAD ARM REST (MISCELLANEOUS) ×2 IMPLANT
LABEL OR SOLS (LABEL) ×2 IMPLANT
MANIFOLD NEPTUNE II (INSTRUMENTS) ×2 IMPLANT
NDL HYPO 22X1.5 SAFETY MO (MISCELLANEOUS) ×2 IMPLANT
NEEDLE HYPO 22X1.5 SAFETY MO (MISCELLANEOUS) ×1 IMPLANT
NS IRRIG 500ML POUR BTL (IV SOLUTION) ×2 IMPLANT
OBTURATOR OPTICAL STANDARD 8MM (TROCAR) ×1
OBTURATOR OPTICAL STND 8 DVNC (TROCAR) ×1
OBTURATOR OPTICALSTD 8 DVNC (TROCAR) ×2 IMPLANT
PACK LAP CHOLECYSTECTOMY (MISCELLANEOUS) ×2 IMPLANT
PENCIL SMOKE EVACUATOR (MISCELLANEOUS) ×2 IMPLANT
SEAL CANN UNIV 5-8 DVNC XI (MISCELLANEOUS) ×6 IMPLANT
SEAL XI 5MM-8MM UNIVERSAL (MISCELLANEOUS) ×3
SET TUBE SMOKE EVAC HIGH FLOW (TUBING) ×2 IMPLANT
SOL ELECTROSURG ANTI STICK (MISCELLANEOUS) ×1
SOLUTION ELECTROSURG ANTI STCK (MISCELLANEOUS) ×2 IMPLANT
SPIKE FLUID TRANSFER (MISCELLANEOUS) ×2 IMPLANT
SPONGE T-LAP 18X18 ~~LOC~~+RFID (SPONGE) IMPLANT
SPONGE T-LAP 4X18 ~~LOC~~+RFID (SPONGE) ×2 IMPLANT
STAPLER CANNULA SEAL DVNC XI (STAPLE) ×2 IMPLANT
STAPLER CANNULA SEAL XI (STAPLE) ×1
SUT MNCRL AB 4-0 PS2 18 (SUTURE) ×2 IMPLANT
SUT VIC AB 3-0 SH 27 (SUTURE) ×1
SUT VIC AB 3-0 SH 27X BRD (SUTURE) IMPLANT
SUT VICRYL 0 UR6 27IN ABS (SUTURE) ×4 IMPLANT
SYS BAG RETRIEVAL 10MM (BASKET) ×1
SYSTEM BAG RETRIEVAL 10MM (BASKET) ×2 IMPLANT

## 2022-04-28 NOTE — Anesthesia Procedure Notes (Addendum)
Procedure Name: Intubation Date/Time: 04/28/2022 2:20 PM  Performed by: Hilbert Odor, CRNAPre-anesthesia Checklist: Patient identified, Patient being monitored, Timeout performed, Emergency Drugs available and Suction available Patient Re-evaluated:Patient Re-evaluated prior to induction Oxygen Delivery Method: Circle system utilized Preoxygenation: Pre-oxygenation with 100% oxygen Induction Type: IV induction Ventilation: Mask ventilation without difficulty Laryngoscope Size: 3 and McGraph Grade View: Grade I Tube type: Oral Tube size: 7.5 mm Number of attempts: 1 Airway Equipment and Method: Stylet Placement Confirmation: ETT inserted through vocal cords under direct vision, positive ETCO2 and breath sounds checked- equal and bilateral Secured at: 22 cm Tube secured with: Tape Dental Injury: Teeth and Oropharynx as per pre-operative assessment

## 2022-04-28 NOTE — ED Notes (Signed)
Patient transported to MRI 

## 2022-04-28 NOTE — ED Notes (Signed)
RN reached out to ordering Provider Piscoya, MD regarding administration of Cipro due to warning for lactating pt. Receiving nurse notified

## 2022-04-28 NOTE — Interval H&P Note (Signed)
History and Physical Interval Note:  04/28/2022 2:02 PM  Brenda Villegas  has presented today for surgery, with the diagnosis of Acute cholecystitis.  The various methods of treatment have been discussed with the patient and family. After consideration of risks, benefits and other options for treatment, the patient has consented to  Procedure(s): XI ROBOTIC ASSISTED LAPAROSCOPIC CHOLECYSTECTOMY (N/A) [name] (ICG) (N/A) as a surgical intervention.  The patient's history has been reviewed, patient examined, no change in status, stable for surgery.  I have reviewed the patient's chart and labs.  Questions were answered to the patient's satisfaction.     [name]

## 2022-04-28 NOTE — Discharge Instructions (Signed)
Discharge Instructions: 1.  Patient may shower, but do not scrub wounds heavily and dab dry only. 2.  Do not submerge wounds in pool/tub until fully healed. 3.  Do not apply ointments or hydrogen peroxide to the wounds. 4.  May apply ice packs to the wounds for comfort. 5.  Please change umbilical dressing once daily.  Apply gauze into the umbilicus and cover with tape.  Follow up with your Gynecology team. 6.  Do not drive while taking narcotics for pain control.  Prior to driving, make sure you are able to rotate right and left to look at blindspots without significant pain or discomfort. 7.  No heavy lifting or pushing of more than 10-15 lbs for 4 weeks.

## 2022-04-28 NOTE — H&P (Signed)
Date of Admission:  04/28/2022  Reason for Admission:  Acute cholecystitis  History of Present Illness: Brenda Villegas is a 32 y.o. female presenting with 1 day history of epigastric abdominal pain.  The patient reports that yesterday she had tacos at home with her husband and the pain started shortly after.  The pain is in the epigastric area and was radiating towards the back.  Denies any nausea but does report self inducing vomiting once at home, which did not help with her symptoms.  She presented to emergency room last night and her workup showed overall normal labs with normal LFTs except for elevated alkaline phosphatase of 186.  She had an ultrasound the right upper quadrant which showed a distended gallbladder with cholelithiasis although no evidence of cholecystitis.  However her common bile duct did appear mildly dilated to 6.1 mm which is the upper limit of normal.  MRCP was obtained due to this which did not show any choledocholithiasis but did show more gallbladder inflammatory changes consistent with cholecystitis.  On a separate note, the patient reports that she has noticed a little bit of drainage intermittently from her umbilicus where her tubal ligation incision was done.  Currently is not draining.  Denies any fevers or chills and denies any worsening pain at the area.  Past Medical History: Past Medical History:  Diagnosis Date   Allergy    Anemia    GERD (gastroesophageal reflux disease)    h/o with preganancy only   History of migraine    HPV in female    Hyperthyroidism 08/07/2014   Supervision of high risk pregnancy, antepartum 06/28/2019   Clinic [facility] Prenatal Labs  Dating 6 week ultrasound Blood type: O/Negative/-- (05/20 1121)   Genetic Screen Declines AFP: [ ]  Antibody:Negative (10/27 1531) positive on 1/14- weak Anti D antibody noted.  Anatomic [place] Normal female Rubella: 3.58 (05/20 1121)  Varicella: Immune  GTT   Third 103 trimester:  RPR: Non Reactive  (10/27 1531)   Rhogam [x ] 28 weeks. Given 11/23 HBsAg: Negative (05/20 112     Past Surgical History: Past Surgical History:  Procedure Laterality Date   KNEE SURGERY Right 02/09/2004   LAPAROSCOPIC TUBAL LIGATION Bilateral 03/01/2022   Procedure: LAPAROSCOPIC FILSHIE CLIP PLACEMENT FOR PERMANENT STERILIZATION;  Surgeon: Harlin Heys, MD;  Location: [facility] ORS;  Service: Gynecology;  Laterality: [id number]   TONSILLECTOMY     At age 41   WISDOM TOOTH EXTRACTION     all four    Home Medications: Prior to Admission medications   Medication Sig Start Date End Date Taking? Authorizing Provider  acetaminophen (TYLENOL) 325 MG tablet Take 650 mg by mouth every 6 (six) hours as needed. Patient not taking: Reported on 03/17/2022    [provider]  Prenatal Vit-Fe Fumarate-FA (MULTIVITAMIN-PRENATAL) 27-0.8 MG TABS tablet Take 1 tablet by mouth daily at 12 noon.    [provider]  Vitamin D, Ergocalciferol, (DRISDOL) 1.25 MG (50000 UNIT) CAPS capsule Take 1 capsule (50,000 Units total) by mouth every 7 (seven) days. 03/17/22   Shamleffer, Melanie Crazier, MD  fluticasone (FLONASE) 50 MCG/ACT nasal spray Place 1 spray into both nostrils daily. 08/13/19 08/28/19  Chase Picket, MD    Allergies: Allergies  Allergen Reactions   Penicillins Hives    Social History:  reports that she quit smoking about 13 months ago. Her smoking use included e-cigarettes. She has never used smokeless tobacco. She reports that she does not drink alcohol  and does not use drugs.   Family History: Family History  Problem Relation Age of Onset   Hypertension Mother    Ovarian cysts Mother        removed cyst on one side in 2018   Thyroid cancer Maternal Aunt    Breast cancer Paternal Aunt        ? age   Breast cancer Maternal Grandmother    Cancer Maternal Grandfather        pancreatic/esophageal   Asthma Neg Hx    Heart disease Neg Hx    Diabetes Neg Hx    Stroke Neg Hx      Review of Systems: Review of Systems  Constitutional:  Negative for chills and fever.  HENT:  Negative for hearing loss.   Respiratory:  Negative for shortness of breath.   Cardiovascular:  Negative for chest pain.  Gastrointestinal:  Positive for abdominal pain and vomiting (self induced). Negative for nausea.  Genitourinary:  Negative for dysuria.  Musculoskeletal:  Negative for myalgias.  Skin:  Negative for rash.  Neurological:  Negative for dizziness.  Psychiatric/Behavioral:  Negative for depression.     Physical Exam BP 137/82 (BP Location: Left Arm)   Pulse 72   Temp 97.7 F (36.5 C) (Oral)   Resp (!) 22   Ht 5\' 8"  (1.727 m)   Wt 108.9 kg   LMP  (LMP Unknown)   SpO2 100%   Breastfeeding Yes   BMI 36.49 kg/m  CONSTITUTIONAL: No acute distress, well-nourished HEENT:  Normocephalic, atraumatic, extraocular motion intact. NECK: Trachea is midline, and there is no jugular venous distension.  RESPIRATORY:  Normal respiratory effort without pathologic use of accessory muscles. CARDIOVASCULAR: Regular rhythm and rate. GI: The abdomen is soft, non-distended, with tenderness to palpation in the epigastric and RUQ areas.  Equivocal Murphy's sign.  The umbilicus has a small incision that appears to be well-healed.  I do not see any open wounds or drainage at this point. MUSCULOSKELETAL:  Normal muscle strength and tone in all four extremities.  No peripheral edema or cyanosis. SKIN: Skin turgor is normal. There are no pathologic skin lesions.  NEUROLOGIC:  Motor and sensation is grossly normal.  Cranial nerves are grossly intact. PSYCH:  Alert and oriented to person, place and time. Affect is normal.  Laboratory Analysis: Results for orders placed or performed during the hospital encounter of 04/27/22 (from the past 24 hour(s))  Lipase, blood     Status: None   Collection Time: 04/27/22  7:47 PM  Result Value Ref Range   Lipase 46 11 - 51 U/L  Comprehensive metabolic  panel     Status: Abnormal   Collection Time: 04/27/22  7:47 PM  Result Value Ref Range   Sodium 138 135 - 145 mmol/L   Potassium 3.3 (L) 3.5 - 5.1 mmol/L   Chloride 106 98 - 111 mmol/L   CO2 22 22 - 32 mmol/L   Glucose, Bld 109 (H) 70 - 99 mg/dL   BUN 12 6 - 20 mg/dL   Creatinine, Ser 0.82 0.44 - 1.00 mg/dL   Calcium 9.0 8.9 - 10.3 mg/dL   Total Protein 7.7 6.5 - 8.1 g/dL   Albumin 3.7 3.5 - 5.0 g/dL   AST 41 15 - 41 U/L   ALT 23 0 - 44 U/L   Alkaline Phosphatase 186 (H) 38 - 126 U/L   Total Bilirubin 0.5 0.3 - 1.2 mg/dL   GFR, Estimated >60 >60 mL/min   Anion  gap 10 5 - 15  CBC     Status: None   Collection Time: 04/27/22  7:47 PM  Result Value Ref Range   WBC 10.5 4.0 - 10.5 K/uL   RBC 4.96 3.87 - 5.11 MIL/uL   Hemoglobin 13.1 12.0 - 15.0 g/dL   HCT 40.1 36.0 - 46.0 %   MCV 80.8 80.0 - 100.0 fL   MCH 26.4 26.0 - 34.0 pg   MCHC 32.7 30.0 - 36.0 g/dL   RDW 13.9 11.5 - 15.5 %   Platelets 242 150 - 400 K/uL   nRBC 0.0 0.0 - 0.2 %  Urinalysis, Routine w reflex microscopic -Urine, Clean Catch     Status: Abnormal   Collection Time: 04/27/22  7:47 PM  Result Value Ref Range   Color, Urine YELLOW (A) YELLOW   APPearance HAZY (A) CLEAR   Specific Gravity, Urine 1.025 1.005 - 1.030   pH 5.0 5.0 - 8.0   Glucose, UA NEGATIVE NEGATIVE mg/dL   Hgb urine dipstick NEGATIVE NEGATIVE   Bilirubin Urine NEGATIVE NEGATIVE   Ketones, ur NEGATIVE NEGATIVE mg/dL   Protein, ur NEGATIVE NEGATIVE mg/dL   Nitrite NEGATIVE NEGATIVE   Leukocytes,Ua TRACE (A) NEGATIVE   RBC / HPF 0-5 0 - 5 RBC/hpf   WBC, UA 0-5 0 - 5 WBC/hpf   Bacteria, UA FEW (A) NONE SEEN   Squamous Epithelial / HPF 6-10 0 - 5 /HPF   Mucus PRESENT   POC urine preg, ED     Status: None   Collection Time: 04/27/22  7:52 PM  Result Value Ref Range   Preg Test, Ur NEGATIVE NEGATIVE    Imaging: MR ABDOMEN MRCP W WO CONTAST  Result Date: 04/28/2022 CLINICAL DATA:  32 year old female with history of cholelithiasis and  clinical concern for possible choledocholithiasis. EXAM: MRI ABDOMEN WITHOUT AND WITH CONTRAST (INCLUDING MRCP) TECHNIQUE: Multiplanar multisequence MR imaging of the abdomen was performed both before and after the administration of intravenous contrast. Heavily T2-weighted images of the biliary and pancreatic ducts were obtained, and three-dimensional MRCP images were rendered by post processing. CONTRAST:  62mL GADAVIST GADOBUTROL 1 MMOL/ML IV SOLN COMPARISON:  No prior abdominal MRI. Abdominal ultrasound 04/28/2022. FINDINGS: Lower chest: Unremarkable. Hepatobiliary: No suspicious cystic or solid hepatic lesions. Multiple tiny filling defects lie dependently in the gallbladder. Gallbladder is moderately distended with thickened and edematous wall measuring up to 5 mm. Trace volume of pericholecystic fluid adjacent to the fundus. MRCP images demonstrate no intra or extrahepatic biliary ductal dilatation. Common bile duct measures 6 mm in the porta hepatis. No filling defects in the common bile duct to suggest choledocholithiasis. Pancreas: No pancreatic mass. No pancreatic ductal dilatation. No pancreatic or peripancreatic fluid collections or inflammatory changes. Spleen:  Unremarkable. Adrenals/Urinary Tract: Bilateral kidneys and adrenal glands are normal in appearance. No hydroureteronephrosis the visualized portions of the abdomen. Stomach/Bowel: Visualized portions are unremarkable. Vascular/Lymphatic: No aneurysm identified in the visualized abdominal vasculature. No lymphadenopathy noted in the abdomen. Other: Trace amount of fluid adjacent to the inferior margin of the right lobe of the liver. No other significant volume of ascites noted in the visualized portions of the peritoneal cavity. Musculoskeletal: No aggressive appearing osseous lesions are noted in the visualized portions of the skeleton. IMPRESSION: 1. Study is positive for cholelithiasis with imaging findings concerning for potential early  acute cholecystitis, including moderate gallbladder distension, mild gallbladder wall thickening and edema, and trace volume of pericholecystic fluid. 2. No choledocholithiasis or imaging findings to suggest  biliary tract obstruction. Electronically Signed   By: Vinnie Langton M.D.   On: 04/28/2022 05:13   US ABDOMEN LIMITED RUQ (LIVER/GB)  Result Date: 04/28/2022 CLINICAL DATA:  [id number]. Right upper quadrant abdominal pain onset tonight. EXAM: ULTRASOUND ABDOMEN LIMITED RIGHT UPPER QUADRANT COMPARISON:  None Available. FINDINGS: Gallbladder: There is dilatation of the gallbladder after 12 cm in length. There are several layering stones in the distal gallbladder up to 7 mm but no wall thickening, pericholecystic fluid, or positive sonographic Murphy's sign. Common bile duct: Diameter: 6.1 mm, technically is upper limit of normal but could be prominent for a 32 year old. No intrahepatic biliary dilatation is seen. Correlate clinically for biliary stasis and if clinically indicated, MRCP may be helpful. Liver: No focal lesion identified. There is increased hepatic echogenicity of steatosis. Portal vein is patent on color Doppler imaging with normal direction of blood flow towards the liver. The hepatic portal vein is slightly prominent measuring 14.3 mm caliber. Other: None. IMPRESSION: 1. Dilatation of the gallbladder with several layering stones but no wall thickening, pericholecystic fluid, or positive sonographic Murphy's sign. 2. Common bile duct measures 6.1 mm, technically is upper limit of normal but could be prominent for a 32 year old. No intrahepatic biliary dilatation is seen. Correlate clinically for biliary stasis and if clinically warranted, MRCP may be helpful. 3. Increased hepatic echogenicity of steatosis. 4. Slightly prominent portal vein without flow reversal. Electronically Signed   By: Telford Nab M.D.   On: 04/28/2022 00:49    Assessment and Plan: This is a 32 y.o. female with acute  cholecystitis  --Discussed with patient the findings on her laboratory studies and imaging studies.  I have personally reviewed her images and agree with the findings.  Discussed with patient that she does have evidence of acute cholecystitis given the gallbladder distention, wall thickness, and pericholecystic fluid.  Surprisingly, the patient has not had any symptoms until last night.  Discussed with her that given the findings, the recommendation for management would be in the form of cholecystectomy and that this can be done via minimally invasive or robotic approach.  She is in agreement. - Discussed with her then the plan for robotic assisted cholecystectomy and reviewed the surgery at length with her including the planned incisions, the risks of bleeding, infection, injury to surrounding structures, the use of ICG to better evaluate the biliary anatomy, postoperative activity restrictions, pain control, possible drain and or antibiotics, the possibility of going home today versus tomorrow, and she is willing to proceed. - With regards to the umbilicus, during surgery, we will be able to take a closer look to make sure there is no wounds or abscesses. - All of her questions have been answered.  I spent 75 minutes dedicated to the care of this patient on the date of this encounter to include pre-visit review of records, face-to-face time with the patient discussing diagnosis and management, and any post-visit coordination of care.   Melvyn Neth, MD [facility] Pg:  [phone]

## 2022-04-28 NOTE — Discharge Summary (Addendum)
Patient ID: Brenda Villegas MRN: [id number] DOB/AGE: Jul 13, 1990 32 y.o.  Admit date: 04/27/2022 Discharge date: 04/28/2022   Discharge Diagnoses:  Principal Problem:   Acute cholecystitis Active Problems:   Open wound of umbilical region   Procedures: Robotic assisted cholecystectomy Umbilical wound exploration with application of silver nitrate.  Hospital Course: Patient was admitted on 04/27/22 with acute cholecystitis.  She was taken to the OR on 04/28/22 for robotic cholecystectomy.  The patient also mentioned that she has a wound at the umbilicus from her tubal ligation, and this was also explored and treated with silver nitrate.  Post-operatively the patient has done well.  Her pain is well controlled, tolerating a diet, and she will be discharged home today.  Follow up in two weeks.  Consults: None  Disposition: Discharge disposition: 01-Home or Self Care       Discharge Instructions     Call MD for:  difficulty breathing, headache or visual disturbances   Complete by: As directed    Call MD for:  persistant nausea and vomiting   Complete by: As directed    Call MD for:  redness, tenderness, or signs of infection (pain, swelling, redness, odor or green/yellow discharge around incision site)   Complete by: As directed    Call MD for:  severe uncontrolled pain   Complete by: As directed    Call MD for:  temperature >100.4   Complete by: As directed    Diet general   Complete by: As directed    Regular diet as tolerated   Discharge instructions   Complete by: As directed    1.  Patient may shower, but do not scrub wounds heavily and dab dry only. 2.  Do not submerge wounds in pool/tub until fully healed. 3.  Do not apply ointments or hydrogen peroxide to the wounds. 4.  May apply ice packs to the wounds for comfort. 5.  Please change umbilical dressing once daily.  Apply gauze into the umbilicus and cover with tape.  Follow up with your Gynecology team.    Driving Restrictions   Complete by: As directed    Do not drive while taking narcotics for pain control.  Prior to driving, make sure you are able to rotate right and left to look at blindspots without significant pain or discomfort.   Increase activity slowly   Complete by: As directed    Lifting restrictions   Complete by: As directed    No heavy lifting or pushing of more than 10-15 lbs for 4 weeks.      Allergies as of 04/28/2022       Reactions   Penicillins Hives        Medication List     TAKE these medications    acetaminophen 325 MG tablet Commonly known as: TYLENOL Take 650 mg by mouth every 6 (six) hours as needed. What changed: Another medication with the same name was added. Make sure you understand how and when to take each.   acetaminophen 500 MG tablet Commonly known as: TYLENOL Take 2 tablets (1,000 mg total) by mouth every 6 (six) hours as needed for mild pain. What changed: You were already taking a medication with the same name, and this prescription was added. Make sure you understand how and when to take each.   ibuprofen 600 MG tablet Commonly known as: ADVIL Take 1 tablet (600 mg total) by mouth every 8 (eight) hours as needed for moderate pain.   multivitamin-prenatal 27-0.8 MG  Tabs tablet Take 1 tablet by mouth daily at 12 noon.   oxyCODONE 5 MG immediate release tablet Commonly known as: Oxy IR/ROXICODONE Take 1 tablet (5 mg total) by mouth every 4 (four) hours as needed for severe pain.   Vitamin D (Ergocalciferol) 1.25 MG (50000 UNIT) Caps capsule Commonly known as: DRISDOL Take 1 capsule (50,000 Units total) by mouth every 7 (seven) days.        Follow-up Information     Tyronne Blann, Jacqulyn Bath, MD Follow up in 2 week(s).   Specialty: General Surgery Contact information: [address] [place] [address] [phone]                Olean Ree, MD

## 2022-04-28 NOTE — Op Note (Signed)
Procedure Date:  04/28/2022  Pre-operative Diagnosis:  Acute cholecystitis  Post-operative Diagnosis: Acute cholecystitis and umbilical wound.  Procedure:  Robotic assisted cholecystectomy, umbilical wound exploration and application of silver nitrate.  Surgeon:  Melvyn Neth, MD  Anesthesia:  General endotracheal  Estimated Blood Loss:  10 ml  Specimens:  gallbladder  Complications:  None  Indications for Procedure:  This is a 32 y.o. female who presents with abdominal pain and workup revealing acute cholecystitis.  The benefits, complications, treatment options, and expected outcomes were discussed with the patient. The risks of bleeding, infection, recurrence of symptoms, failure to resolve symptoms, bile duct damage, bile duct leak, retained common bile duct stone, bowel injury, and need for further procedures were all discussed with the patient and she was willing to proceed.  She received an injection of ICG via intravenous catheter prior to surgery start.  Description of Procedure: The patient was correctly identified in the preoperative area and brought into the operating room.  The patient was placed supine with VTE prophylaxis in place.  Appropriate time-outs were performed.  Anesthesia was induced and the patient was intubated.  Appropriate antibiotics were infused.  The abdomen was prepped and draped in a sterile fashion. An infraumbilical incision was made. A cutdown technique was used to enter the abdominal cavity without injury, and a 12 mm robotic port was inserted.  Pneumoperitoneum was obtained with appropriate opening pressures.  Three 8-mm ports were placed in the mid abdomen at the level of the umbilicus under direct visualization.  The DaVinci platform was docked, camera targeted, and instruments were placed under direct visualization.  The gallbladder was identified and noted to be edematous.  The fundus was grasped and retracted cephalad.  Adhesions were lysed  bluntly and with electrocautery. The infundibulum was grasped and retracted laterally, exposing the peritoneum overlying the gallbladder.  This was incised with electrocautery and extended on either side of the gallbladder.  FireFly cholangiogram was attempted, but the ICG had not left the liver yet.  However, the cystic duct and common bile duct were easily seen without ICG.  The cystic duct and cystic artery were carefully dissected with combination of cautery and blunt dissection.  Both were clipped twice proximally and once distally, cutting in between.  The gallbladder was taken from the gallbladder fossa in a retrograde fashion with electrocautery. The gallbladder was placed in an Endocatch bag. The liver bed was inspected and any bleeding was controlled with electrocautery. The right upper quadrant was then inspected again revealing intact clips, no bleeding, and no ductal injury.   The 8 mm ports were removed under direct visualization and the 12 mm port was removed.  The Endocatch bag was brought out via the umbilical incision. The fascial opening was closed using 0 vicryl suture.  Local anesthetic was infused in all incisions and the incisions were closed with 4-0 Monocryl.  The wounds were cleaned and sealed with DermaBond.  The patient's prior tubal ligation incision at the base of the umbilicus was then explored.  She had an open wound at the very base of the umbilicus, without any purulent drainage.  Silver nitrate was applied to promote scarring/healing.  2x2 gauze was applied to the umbilicus and secured with tape.  The patient was emerged from anesthesia and extubated and brought to the recovery room for further management.  The patient tolerated the procedure well and all counts were correct at the end of the case.   Melvyn Neth, MD

## 2022-04-28 NOTE — ED Notes (Signed)
L& D breast pump provided at bedside

## 2022-04-28 NOTE — Transfer of Care (Signed)
Immediate Anesthesia Transfer of Care Note  Patient: Brenda Villegas  Procedure(s) Performed: XI ROBOTIC ASSISTED LAPAROSCOPIC CHOLECYSTECTOMY (Abdomen) INDOCYANINE GREEN FLUORESCENCE IMAGING (ICG) (Abdomen)  Patient Location: PACU  Anesthesia Type:General  Level of Consciousness: drowsy  Airway & Oxygen Therapy: Patient Spontanous Breathing and Patient connected to face mask oxygen  Post-op Assessment: Report given to RN and Post -op Vital signs reviewed and stable  Post vital signs: Reviewed and stable  Last Vitals:  Vitals Value Taken Time  BP 123/67 04/28/22 1613  Temp    Pulse 76 04/28/22 1614  Resp 13 04/28/22 1614  SpO2 96 % 04/28/22 1614  Vitals shown include unvalidated device data.  Last Pain:  Vitals:   04/28/22 1344  TempSrc: Tympanic  PainSc: 0-No pain         Complications: No notable events documented.

## 2022-04-28 NOTE — Anesthesia Preprocedure Evaluation (Signed)
Anesthesia Evaluation  Patient identified by MRN, date of birth, ID band Patient awake    Reviewed: Allergy & Precautions, NPO status , Patient's Chart, lab work & pertinent test results  History of Anesthesia Complications Negative for: history of anesthetic complications  Airway Mallampati: II  TM Distance: >3 FB Neck ROM: full    Dental  (+) Dental Advidsory Given, Teeth Intact   Pulmonary neg shortness of breath, former smoker   Pulmonary exam normal        Cardiovascular Exercise Tolerance: Good (-) angina (-) Past MI negative cardio ROS Normal cardiovascular exam     Neuro/Psych negative neurological ROS  negative psych ROS   GI/Hepatic Neg liver ROS,GERD  Controlled,,  Endo/Other  Hypothyroidism    Renal/GU      Musculoskeletal   Abdominal   Peds  Hematology negative hematology ROS (+)   Anesthesia Other Findings Past Medical History: No date: Allergy No date: Anemia No date: GERD (gastroesophageal reflux disease)     Comment:  h/o with preganancy only No date: History of migraine 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  Anatomic [place] Normal               female Rubella: 3.58 (05/20 1121)  Varicella: Immune  GTT                Third 103 trimester:  RPR: Non Reactive (10/27 1531)                 Rhogam [x ] 28 weeks. Given 11/23 HBsAg: Negative (05/20               112  Past Surgical History: 02/09/2004: KNEE SURGERY; Right No date: TONSILLECTOMY     Comment:  At age 32 No date: WISDOM TOOTH EXTRACTION     Comment:  all four  BMI    Body Mass Index: 35.58 kg/m       Reproductive/Obstetrics (+) Breast feeding                              Anesthesia Physical Anesthesia Plan  ASA: 2  Anesthesia Plan: General ETT   Post-op Pain Management:    Induction: Intravenous  PONV Risk Score and Plan: Ondansetron, Dexamethasone and Midazolam  Airway Management Planned: Oral ETT  Additional Equipment:   Intra-op Plan:   Post-operative Plan: Extubation in OR  Informed Consent: I have reviewed the patients History and Physical, chart, labs and discussed the procedure including the risks, benefits and alternatives for the proposed anesthesia with the patient or authorized representative who has indicated his/her understanding and acceptance.     Dental Advisory Given  Plan Discussed with: Anesthesiologist, CRNA and Surgeon  Anesthesia Plan Comments: (Patient consented for risks of anesthesia including but not limited to:  - adverse reactions to medications - damage to eyes, teeth, lips or other oral mucosa - nerve damage due to positioning  - sore throat  or hoarseness - Damage to heart, brain, nerves, lungs, other parts of body or loss of life  Patient voiced understanding.)       Anesthesia Quick Evaluation

## 2022-04-29 NOTE — Anesthesia Postprocedure Evaluation (Signed)
Anesthesia Post Note  Patient: Brenda Villegas Hegg  Procedure(s) Performed: XI ROBOTIC ASSISTED LAPAROSCOPIC CHOLECYSTECTOMY (Abdomen) INDOCYANINE GREEN FLUORESCENCE IMAGING (ICG) (Abdomen)  Patient location during evaluation: PACU Anesthesia Type: General Level of consciousness: awake and alert Pain management: pain level controlled Vital Signs Assessment: post-procedure vital signs reviewed and stable Respiratory status: spontaneous breathing, nonlabored ventilation and respiratory function stable Cardiovascular status: blood pressure returned to baseline and stable Postop Assessment: no apparent nausea or vomiting Anesthetic complications: no   No notable events documented.   Last Vitals:  Vitals:   04/28/22 1715 04/28/22 1740  BP: 118/74 111/75  Pulse: 94 75  Resp: 13 16  Temp:  36.7 C  SpO2: 96% 97%    Last Pain:  Vitals:   04/28/22 1740  TempSrc: Oral  PainSc:                  [place] Ouch

## 2022-04-30 LAB — SURGICAL PATHOLOGY

## 2022-05-14 ENCOUNTER — Ambulatory Visit (INDEPENDENT_AMBULATORY_CARE_PROVIDER_SITE_OTHER): Payer: Medicaid Other | Admitting: Surgery

## 2022-05-14 ENCOUNTER — Encounter: Payer: Self-pay | Admitting: Surgery

## 2022-05-14 VITALS — BP 107/72 | HR 54 | Temp 98.1°F | Ht 68.0 in | Wt 242.8 lb

## 2022-05-14 DIAGNOSIS — K810 Acute cholecystitis: Secondary | ICD-10-CM

## 2022-05-14 DIAGNOSIS — Z09 Encounter for follow-up examination after completed treatment for conditions other than malignant neoplasm: Secondary | ICD-10-CM

## 2022-05-14 NOTE — Patient Instructions (Signed)
If you have any concerns or questions, please feel free to call our office. Follow up as needed.    GENERAL POST-OPERATIVE PATIENT INSTRUCTIONS   WOUND CARE INSTRUCTIONS:  Keep a dry clean dressing on the wound if there is drainage. The initial bandage may be removed after 24 hours.  Once the wound has quit draining you may leave it open to air.  If clothing rubs against the wound or causes irritation and the wound is not draining you may cover it with a dry dressing during the daytime.  Try to keep the wound dry and avoid ointments on the wound unless directed to do so.  If the wound becomes bright red and painful or starts to drain infected material that is not clear, please contact your physician immediately.  If the wound is mildly pink and has a thick firm ridge underneath it, this is normal, and is referred to as a healing ridge.  This will resolve over the next 4-6 weeks.  BATHING: You may shower if you have been informed of this by your surgeon. However, Please do not submerge in a tub, hot tub, or pool until incisions are completely sealed or have been told by your surgeon that you may do so.  DIET:  You may eat any foods that you can tolerate.  It is a good idea to eat a high fiber diet and take in plenty of fluids to prevent constipation.  If you do become constipated you may want to take a mild laxative or take ducolax tablets on a daily basis until your bowel habits are regular.  Constipation can be very uncomfortable, along with straining, after recent surgery.  ACTIVITY:  You are encouraged to cough and deep breath or use your incentive spirometer if you were given one, every 15-30 minutes when awake.  This will help prevent respiratory complications and low grade fevers post-operatively if you had a general anesthetic.  You may want to hug a pillow when coughing and sneezing to add additional support to the surgical area, if you had abdominal or chest surgery, which will decrease pain  during these times.  You are encouraged to walk and engage in light activity for the next two weeks.  You should not lift more than 20 pounds for 6 weeks total after surgery as it could put you at increased risk for complications.  Twenty pounds is roughly equivalent to a plastic bag of groceries. At that time- Listen to your body when lifting, if you have pain when lifting, stop and then try again in a few days. Soreness after doing exercises or activities of daily living is normal as you get back in to your normal routine.  MEDICATIONS:  Try to take narcotic medications and anti-inflammatory medications, such as tylenol, ibuprofen, naprosyn, etc., with food.  This will minimize stomach upset from the medication.  Should you develop nausea and vomiting from the pain medication, or develop a rash, please discontinue the medication and contact your physician.  You should not drive, make important decisions, or operate machinery when taking narcotic pain medication.  SUNBLOCK Use sun block to incision area over the next year if this area will be exposed to sun. This helps decrease scarring and will allow you avoid a permanent darkened area over your incision.  QUESTIONS:  Please feel free to call our office if you have any questions, and we will be glad to assist you. [phone]   

## 2022-05-14 NOTE — Progress Notes (Signed)
05/14/2022  HPI: Brenda Villegas is a 32 y.o. female s/p robotic assisted cholecystectomy on 04/28/22.  She presents for follow up.  She's been doing well, with some discomfort at the umbilical port site depending on how she moves.  Denies any nausea or vomiting.  Reports diarrhea after eating.  Also reports itchiness at the incisions.  Vital signs: BP 107/72   Pulse (!) 54   Temp 98.1 F (36.7 C) (Oral)   Ht 5\' 8"  (1.727 m)   Wt 242 lb 12.8 oz (110.1 kg)   LMP  (LMP Unknown)   SpO2 99%   BMI 36.92 kg/m    Physical Exam: Constitutional:   No acute distress Abdomen:  Soft, non-distended, approrpiately sore to palpation.  Incisions are clean, dry, intact with some dermabond still in place.  Assessment/Plan: This is a 32 y.o. female s/p robotic assisted cholecystectomy  --Discussed with the patient that the diarrhea may be from her body still adjusting to not having a gallbladder.  This will continue to improve as her body adapts. --She can try benadryl ointment to help with spot itchiness --Reminder her of activity restrictions --Follow up as needed.   Howie Ill, MD  [facility]

## 2022-05-20 ENCOUNTER — Encounter: Payer: Self-pay | Admitting: Obstetrics and Gynecology

## 2022-05-20 ENCOUNTER — Other Ambulatory Visit (HOSPITAL_COMMUNITY)
Admission: RE | Admit: 2022-05-20 | Discharge: 2022-05-20 | Disposition: A | Discharge status: Home / Self Care | Payer: Medicaid Other | Source: Ambulatory Visit | Attending: Obstetrics and Gynecology | Admitting: Obstetrics and Gynecology

## 2022-05-20 ENCOUNTER — Ambulatory Visit: Payer: Medicaid Other | Admitting: Obstetrics and Gynecology

## 2022-05-20 VITALS — BP 108/72 | HR 65 | Ht 68.0 in | Wt 242.1 lb

## 2022-05-20 DIAGNOSIS — Z124 Encounter for screening for malignant neoplasm of cervix: Secondary | ICD-10-CM | POA: Diagnosis present

## 2022-05-20 DIAGNOSIS — Z01419 Encounter for gynecological examination (general) (routine) without abnormal findings: Secondary | ICD-10-CM | POA: Diagnosis present

## 2022-05-20 DIAGNOSIS — Z9889 Other specified postprocedural states: Secondary | ICD-10-CM

## 2022-05-20 NOTE — Progress Notes (Signed)
Patients presents for annual exam today. She states she is continuing to breastfeed. Due for pap smear, ordered. She states no other questions or concerns at this time.

## 2022-05-20 NOTE — Progress Notes (Signed)
HPI:      [name] is a 32 y.o. [phone] who LMP was No LMP recorded (lmp unknown).  Subjective:   She presents today for her annual examination.  She is now doing well.  She had a sudden onset of abdominal pain and was diagnosed with gallbladder issues.  She had a cholecystectomy the next day.  She is doing well and recovered from that. 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 This is approximately 1 year from that negative Pap.    Hx: The following portions of the patient's history were reviewed and updated as appropriate:             She  has a past medical history of Allergy, Anemia, GERD (gastroesophageal reflux disease), History of migraine, HPV in female, Hyperthyroidism (08/07/2014), and Supervision of high risk pregnancy, antepartum (06/28/2019). She does not have any pertinent problems on file. She  has a past surgical history that includes Knee surgery (Right, 02/09/2004); Tonsillectomy; Wisdom tooth extraction; and Laparoscopic tubal ligation (Bilateral, 03/01/2022). Her family history includes Breast cancer in her maternal grandmother and paternal aunt; Cancer in her maternal grandfather; Hypertension in her mother; Ovarian cysts in her mother; Thyroid cancer in her maternal aunt. She  reports that she quit smoking about 13 months ago. Her smoking use included e-cigarettes. She has never used smokeless tobacco. She reports that she does not drink alcohol and does not use drugs. She has a current medication list which includes the following prescription(s): acetaminophen, acetaminophen, multivitamin-prenatal, and [DISCONTINUED] fluticasone. She is allergic to penicillins.       Review of Systems:  Review of Systems  Constitutional: Denied constitutional symptoms, night sweats, recent illness, fatigue, fever, insomnia and weight loss.  Eyes: Denied eye symptoms, eye  pain, photophobia, vision change and visual disturbance.  Ears/Nose/Throat/Neck: Denied ear, nose, throat or neck symptoms, hearing loss, nasal discharge, sinus congestion and sore throat.  Cardiovascular: Denied cardiovascular symptoms, arrhythmia, chest pain/pressure, edema, exercise intolerance, orthopnea and palpitations.  Respiratory: Denied pulmonary symptoms, asthma, pleuritic pain, productive sputum, cough, dyspnea and wheezing.  Gastrointestinal: Denied, gastro-esophageal reflux, melena, nausea and vomiting.  Genitourinary: Denied genitourinary symptoms including symptomatic vaginal discharge, pelvic relaxation issues, and urinary complaints.  Musculoskeletal: Denied musculoskeletal symptoms, stiffness, swelling, muscle weakness and myalgia.  Dermatologic: Denied dermatology symptoms, rash and scar.  Neurologic: Denied neurology symptoms, dizziness, headache, neck pain and syncope.  Psychiatric: Denied psychiatric symptoms, anxiety and depression.  Endocrine: Denied endocrine symptoms including hot flashes and night sweats.   Meds:   Current Outpatient Medications on File Prior to Visit  Medication Sig Dispense Refill   acetaminophen (TYLENOL) 325 MG tablet Take 650 mg by mouth every 6 (six) hours as needed.     acetaminophen (TYLENOL) 500 MG tablet Take 2 tablets (1,000 mg total) by mouth every 6 (six) hours as needed for mild pain.     Prenatal Vit-Fe Fumarate-FA (MULTIVITAMIN-PRENATAL) 27-0.8 MG TABS tablet Take 1 tablet by mouth daily at 12 noon.     [DISCONTINUED] fluticasone (FLONASE) 50 MCG/ACT nasal spray Place 1 spray into both nostrils daily. 16 g 0   No current facility-administered medications on file prior to visit.     Objective:     Vitals:   05/20/22 1408  BP: 108/72  Pulse: 65    Filed Weights   05/20/22 1408  Weight: 242 lb 1.6 oz (109.8 kg)  Physical examination General NAD, Conversant  HEENT Atraumatic; Op clear with mmm.   Normo-cephalic. Pupils reactive. Anicteric sclerae  Thyroid/Neck Smooth without nodularity or enlargement. Normal ROM.  Neck Supple.  Skin No rashes, lesions or ulceration. Normal palpated skin turgor. No nodularity.  Breasts: No masses or discharge.  Symmetric.  No axillary adenopathy.  Lungs: Clear to auscultation.No rales or wheezes. Normal Respiratory effort, no retractions.  Heart: NSR.  No murmurs or rubs appreciated. No peripheral edema  Abdomen: Soft.  Non-tender.  No masses.  No HSM. No hernia  Extremities: Moves all appropriately.  Normal ROM for age. No lymphadenopathy.  Neuro: Oriented to PPT.  Normal mood. Normal affect.     Pelvic:   Vulva: Normal appearance.  No lesions.  Vagina: No lesions or abnormalities noted.  Support: Normal pelvic support.  Urethra No masses tenderness or scarring.  Meatus Normal size without lesions or prolapse.  Cervix: Normal appearance.  No lesions.  Anus: Normal exam.  No lesions.  Perineum: Normal exam.  No lesions.        Bimanual   Uterus: Normal size.  Non-tender.  Mobile.  AV.  Adnexae: No masses.  Non-tender to palpation.  Cul-de-sac: Negative for abnormality.     Assessment:    [id number] Patient Active Problem List   Diagnosis Date Noted   Acute cholecystitis 04/28/2022   Open wound of umbilical region 04/28/2022   Labor and delivery, indication for care 11/30/2021   Pap smear of cervix shows high risk HPV present 05/07/2021   Postpartum care following vaginal delivery 02/22/2020   Encounter for care or examination of lactating mother 02/22/2020   History of Hashimoto thyroiditis 06/28/2019   Rh negative state in antepartum period 06/28/2019   Hypothyroidism 08/07/2014     1. Well woman exam with routine gynecological exam   2. Cervical cancer screening     Patient doing well postop from cholecystectomy   Plan:            1.  Basic Screening Recommendations The basic screening recommendations for asymptomatic women  were discussed with the patient during her visit.  The age-appropriate recommendations were discussed with her and the rational for the tests reviewed.  When I am informed by the patient that another primary care physician has previously obtained the age-appropriate tests and they are up-to-date, only outstanding tests are ordered and referrals given as necessary.  Abnormal results of tests will be discussed with her when all of her results are completed.  Routine preventative health maintenance measures emphasized: Exercise/Diet/Weight control, Tobacco Warnings, Alcohol/Substance use risks and Stress Management Pap Co-test performed -Will contact if colposcopy necessary.  We have done this today because of her history of CIN-2 which seem to resolve spontaneously and quickly.   Orders No orders of the defined types were placed in this encounter.   No orders of the defined types were placed in this encounter.         F/U  Return in about 1 year (around 05/20/2023) for Annual Physical, We will contact her with any abnormal test results.  Elonda Husky, M.D. 05/20/2022 2:26 PM

## 2022-05-31 LAB — CYTOLOGY - PAP
Comment: NEGATIVE
Diagnosis: NEGATIVE
High risk HPV: NEGATIVE

## 2022-09-17 ENCOUNTER — Ambulatory Visit: Payer: Medicaid Other | Admitting: Internal Medicine

## 2022-09-17 NOTE — Progress Notes (Deleted)
Name: Brenda Villegas  MRN/ DOB: [id number], 07-Jun-1990    Age/ Sex: 32 y.o., female    PCP: Pcp, No   Reason for Endocrinology Evaluation: Hashimoto's Disease      Date of Initial Endocrinology Evaluation: 03/17/2022    HPI: Brenda Villegas is a 32 y.o. female with a past medical history of Hashimoto's disease. The patient presented for initial endocrinology clinic visit on 03/17/2022 for consultative assistance with her Hashimoto's disease.   Patient has been diagnosed with Hashimoto's disease in 2016 with an elevated TPO antibody of 241 IU/mL.  This was after the delivery of her first son, she was noted with a suppressed TSH in 2016 which was a few months postpartum.  She was also noted to have an elevated TSH at 7.72 u IU/mL 09/2020 The patient required LT-4 replacement during her third pregnancy in 2023.  She discontinued levothyroxine following pregnancy with normalization of her TFTs 4 months postpartum   S/P tubal ligation  Maternal grandma with thyroid disease, cousin required   On her initial visit to our clinic she had normal TFTs, she was not on any levothyroxine and remained off  SUBJECTIVE:    Today (09/17/22): Brenda Villegas is here for follow-up on Hashimoto's thyroiditis.   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   Ergocalciferol 50,000 IU weekly  HISTORY:  Past Medical History:  Past Medical History:  Diagnosis Date   Allergy    Anemia    GERD (gastroesophageal reflux disease)    h/o with preganancy only   History of migraine    HPV in female    Hyperthyroidism 08/07/2014   Supervision of high risk pregnancy, antepartum 06/28/2019   [facility] Prenatal Labs  Dating 6 week ultrasound Blood type: O/Negative/-- (05/20 1121)   Genetic Screen Declines AFP: [ ]  Antibody:Negative (10/27 1531) positive on 1/14- weak Anti D antibody noted.  Anatomic [place]  Normal female Rubella: 3.58 (05/20 1121)  Varicella: Immune  GTT   Third 103 trimester:  RPR: Non Reactive (10/27 1531)   Rhogam [x ] 28 weeks. Given 11/23 HBsAg: Negative (05/20 112   Past Surgical History:  Past Surgical History:  Procedure Laterality Date   KNEE SURGERY Right 02/09/2004   LAPAROSCOPIC TUBAL LIGATION Bilateral 03/01/2022   Procedure: LAPAROSCOPIC FILSHIE CLIP PLACEMENT FOR PERMANENT STERILIZATION;  Surgeon: Linzie Collin, MD;  Location: [facility] ORS;  Service: Gynecology;  Laterality: Bilateral;  [id number]   TONSILLECTOMY     At age 32   WISDOM TOOTH EXTRACTION     all four    Social History:  reports that she quit smoking about 17 months ago. Her smoking use included e-cigarettes. She has never used smokeless tobacco. She reports that she does not drink alcohol and does not use drugs. Family History: family history includes Breast cancer in her maternal grandmother and paternal aunt; Cancer in her maternal grandfather; Hypertension in her mother; Ovarian cysts in her mother; Thyroid cancer in her maternal aunt.   HOME MEDICATIONS: Allergies as of 09/17/2022       Reactions   Penicillins Hives        Medication List        Accurate as of September 17, 2022  7:22 AM. If you have any questions, ask your nurse or doctor.          acetaminophen 325 MG tablet  Commonly known as: TYLENOL Take 650 mg by mouth every 6 (six) hours as needed.   acetaminophen 500 MG tablet Commonly known as: TYLENOL Take 2 tablets (1,000 mg total) by mouth every 6 (six) hours as needed for mild pain.   multivitamin-prenatal 27-0.8 MG Tabs tablet Take 1 tablet by mouth daily at 12 noon.          REVIEW OF SYSTEMS: A comprehensive ROS was conducted with the patient and is negative except as per HPI     OBJECTIVE:  VS: There were no vitals taken for this visit.   Wt Readings from Last 3 Encounters:  05/20/22 242 lb 1.6 oz (109.8 kg)  05/14/22 242 lb 12.8 oz (110.1 kg)  04/28/22  242 lb (109.8 kg)     EXAM: General: Pt appears well and is in NAD  Eyes: External eye exam normal without stare, lid lag or exophthalmos.   Neck: General: Supple without adenopathy. Thyroid: Thyroid size normal.  No goiter or nodules appreciated.   Lungs: Clear with good BS bilat   Heart: Auscultation: RRR.  Abdomen: Normoactive bowel sounds, soft, nontender, without masses or organomegaly palpable  Extremities:  BL LE: No pretibial edema normal ROM and strength.  Mental Status: Judgment, insight: Intact Orientation: Oriented to time, place, and person Mood and affect: No depression, anxiety, or agitation     DATA REVIEWED:     Latest Reference Range & Units 03/17/22 13:41  TSH 0.35 - 5.50 uIU/mL 0.59  T4,Free(Direct) 0.60 - 1.60 ng/dL 1.61    Latest Reference Range & Units 03/17/22 13:41  VITD 30.00 - 100.00 ng/mL 8.88 (L)    ASSESSMENT/PLAN/RECOMMENDATIONS:   Hashimoto's Disease:  -No local neck symptoms -She required levothyroxine 50 mcg during her third pregnancy in 2023.  She has not required any LT-4 replacement since delivery -Repeat TFTs are normal -I explained to the patient that Hashimoto's Disease is an autoimmune - mediated destruction of the thyroid gland. The usual course of Hashimoto's thyroiditis is the gradual loss of thyroid function.  -No need for thyroid hormone replacement at this time   2.  Vitamin D deficiency:  -Vitamin D extremely low we will replenish  Medication  Start ergocalciferol 50,000 IU weekly  Follow-up in 6 months  Signed electronically by: Lyndle Herrlich, MD  [facility] Endocrinology  [facility] [address] [place], [place] [address] Phone: [phone] FAX: [phone]   CC: Pcp, No No address on file Phone: None Fax: None   Return to Endocrinology clinic as below: Future Appointments  Date Time Provider Department Center  09/17/2022 10:10 AM , Konrad Dolores, MD  [facility] None

## 2022-09-21 ENCOUNTER — Encounter: Payer: Self-pay | Admitting: Internal Medicine

## 2022-09-21 ENCOUNTER — Ambulatory Visit: Payer: Medicaid Other | Admitting: Internal Medicine

## 2022-09-21 VITALS — BP 120/80 | HR 97 | Ht 68.0 in | Wt 253.0 lb

## 2022-09-21 DIAGNOSIS — E559 Vitamin D deficiency, unspecified: Secondary | ICD-10-CM | POA: Diagnosis not present

## 2022-09-21 DIAGNOSIS — E063 Autoimmune thyroiditis: Secondary | ICD-10-CM

## 2022-09-21 NOTE — Progress Notes (Unsigned)
Name: Brenda Villegas  MRN/ DOB: [id number], 05/27/1990    Age/ Sex: 32 y.o., female    PCP: Pcp, No   Reason for Endocrinology Evaluation: Hashimoto's Disease      Date of Initial Endocrinology Evaluation: 03/17/2022    HPI: Brenda Villegas is a 32 y.o. female with a past medical history of Hashimoto's disease. The patient presented for initial endocrinology clinic visit on 03/17/2022 for consultative assistance with her Hashimoto's disease.   Patient has been diagnosed with Hashimoto's disease in 2016 with an elevated TPO antibody of 241 IU/mL.  This was after the delivery of her first son, she was noted with a suppressed TSH in 2016 which was a few months postpartum.  She was also noted to have an elevated TSH at 7.72 u IU/mL 09/2020 The patient required LT-4 replacement during her third pregnancy in 2023.  She discontinued levothyroxine following pregnancy with normalization of her TFTs 4 months postpartum   S/P tubal ligation  Maternal grandma with thyroid disease, cousin required   On her initial visit to our clinic she had normal TFTs, she was not on any levothyroxine and remained off  SUBJECTIVE:    Today (09/21/22): Brenda Villegas is here for follow-up on Hashimoto's thyroiditis.  Since her last visit here she required cholecystectomy 04/2022 No changes in local neck size   Denies nausea or vomiting  Occasional diarrhea but no vomiting Continues with fatigue  Continues to breastfeed  She is on prenatals  Denies local neck swelling   Ergocalciferol 50,000 IU weekly- not taking   HISTORY:  Past Medical History:  Past Medical History:  Diagnosis Date   Allergy    Anemia    GERD (gastroesophageal reflux disease)    h/o with preganancy only   History of migraine    HPV in female    Hyperthyroidism 08/07/2014   Supervision of high risk pregnancy, antepartum 06/28/2019   Clinic [facility] Prenatal Labs  Dating 6 week ultrasound Blood type: O/Negative/--  (05/20 1121)   Genetic Screen Declines AFP: [ ]  Antibody:Negative (10/27 1531) positive on 1/14- weak Anti D antibody noted.  Anatomic [place] Normal female Rubella: 3.58 (05/20 1121)  Varicella: Immune  GTT   Third 103 trimester:  RPR: Non Reactive (10/27 1531)   Rhogam [x ] 28 weeks. Given 11/23 HBsAg: Negative (05/20 112   Past Surgical History:  Past Surgical History:  Procedure Laterality Date   KNEE SURGERY Right 02/09/2004   LAPAROSCOPIC TUBAL LIGATION Bilateral 03/01/2022   Procedure: LAPAROSCOPIC FILSHIE CLIP PLACEMENT FOR PERMANENT STERILIZATION;  Surgeon: Linzie Collin, MD;  Location: [facility] ORS;  Service: Gynecology;  Laterality: Bilateral;  [id number]   TONSILLECTOMY     At age 71   WISDOM TOOTH EXTRACTION     all four    Social History:  reports that she quit smoking about 17 months ago. Her smoking use included e-cigarettes. She has never used smokeless tobacco. She reports that she does not drink alcohol and does not use drugs. Family History: family history includes Breast cancer in her maternal grandmother and paternal aunt; Cancer in her maternal grandfather; Hypertension in her mother; Ovarian cysts in her mother; Thyroid cancer in her maternal aunt.   HOME MEDICATIONS: Allergies as of 09/21/2022       Reactions   Penicillins Hives        Medication List        Accurate as of September 21, 2022 12:13 PM. If you have any questions,  ask your nurse or doctor.          acetaminophen 325 MG tablet Commonly known as: TYLENOL Take 650 mg by mouth every 6 (six) hours as needed.   acetaminophen 500 MG tablet Commonly known as: TYLENOL Take 2 tablets (1,000 mg total) by mouth every 6 (six) hours as needed for mild pain.   multivitamin-prenatal 27-0.8 MG Tabs tablet Take 1 tablet by mouth daily at 12 noon.          REVIEW OF SYSTEMS: A comprehensive ROS was conducted with the patient and is negative except as per HPI     OBJECTIVE:  VS: There were no vitals taken  for this visit.   Wt Readings from Last 3 Encounters:  05/20/22 242 lb 1.6 oz (109.8 kg)  05/14/22 242 lb 12.8 oz (110.1 kg)  04/28/22 242 lb (109.8 kg)     EXAM: General: Pt appears well and is in NAD  Eyes: External eye exam normal without stare, lid lag or exophthalmos.   Neck: General: Supple without adenopathy. Thyroid: Thyroid size normal.  No goiter or nodules appreciated.   Lungs: Clear with good BS bilat   Heart: Auscultation: RRR.  Abdomen: Normoactive bowel sounds, soft, nontender, without masses or organomegaly palpable  Extremities:  BL LE: No pretibial edema normal ROM and strength.  Mental Status: Judgment, insight: Intact Orientation: Oriented to time, place, and person Mood and affect: No depression, anxiety, or agitation     DATA REVIEWED:     Latest Reference Range & Units 03/17/22 13:41  TSH 0.35 - 5.50 uIU/mL 0.59  T4,Free(Direct) 0.60 - 1.60 ng/dL 0.10    Latest Reference Range & Units 03/17/22 13:41  VITD 30.00 - 100.00 ng/mL 8.88 (L)    ASSESSMENT/PLAN/RECOMMENDATIONS:   Hashimoto's Disease:  -No local neck symptoms -She required levothyroxine 50 mcg during her third pregnancy in 2023.  She has not required any LT-4 replacement since delivery -Repeat TFTs are normal -I explained to the patient that Hashimoto's Disease is an autoimmune - mediated destruction of the thyroid gland. The usual course of Hashimoto's thyroiditis is the gradual loss of thyroid function.  -No need for thyroid hormone replacement at this time   2.  Vitamin D deficiency:  -Vitamin D extremely low we will replenish  Medication  Start ergocalciferol 50,000 IU weekly  Follow-up in 6 months  Signed electronically by: Lyndle Herrlich, MD  [facility] Endocrinology  [facility] [address] [employer] [place] [place], [place] [address] Phone: [phone] FAX: [phone]   CC: Pcp, No No address on file Phone: None Fax: None   Return to  Endocrinology clinic as below: Future Appointments  Date Time Provider Department Center  09/21/2022  2:40 PM Glendell Schlottman, Konrad Dolores, MD [id number] None

## 2022-09-22 MED ORDER — ERGOCALCIFEROL 1.25 MG (50000 UT) PO CAPS: 50000.0000 [IU] | ORAL_CAPSULE | ORAL | 3 refills | Status: DC

## 2023-03-24 ENCOUNTER — Encounter: Payer: Self-pay | Admitting: Internal Medicine

## 2023-03-24 ENCOUNTER — Ambulatory Visit: Payer: Medicaid Other | Admitting: Internal Medicine

## 2023-03-24 VITALS — BP 118/74 | HR 84 | Ht 68.0 in | Wt 262.0 lb

## 2023-03-24 DIAGNOSIS — E559 Vitamin D deficiency, unspecified: Secondary | ICD-10-CM | POA: Diagnosis not present

## 2023-03-24 DIAGNOSIS — E063 Autoimmune thyroiditis: Secondary | ICD-10-CM | POA: Diagnosis not present

## 2023-03-24 NOTE — Progress Notes (Unsigned)
Name: Brenda Villegas  MRN/ DOB: [id number], 17-Dec-1990    Age/ Sex: 33 y.o., female    PCP: Pcp, No   Reason for Endocrinology Evaluation: Hashimoto's Disease      Date of Initial Endocrinology Evaluation: 03/17/2022    HPI: Brenda Villegas is a 33 y.o. female with a past medical history of Hashimoto's disease. The patient presented for initial endocrinology clinic visit on 03/17/2022 for consultative assistance with her Hashimoto's disease.   Patient has been diagnosed with Hashimoto's disease in 2016 with an elevated TPO antibody of 241 IU/mL.  This was after the delivery of her first son, she was noted with a suppressed TSH in 2016 which was a few months postpartum.  She was also noted to have an elevated TSH at 7.72 u IU/mL 09/2020 The patient required LT-4 replacement during her third pregnancy in 2023.  She discontinued levothyroxine following pregnancy with normalization of her TFTs 4 months postpartum   S/P tubal ligation  Maternal grandma with thyroid disease, cousin required   On her initial visit to our clinic she had normal TFTs, she was not on any levothyroxine and remained off  SUBJECTIVE:    Today (03/24/23): [name] is here for follow-up on Hashimoto's thyroiditis.   No changes in local neck size  Denies palpitations  Denies tremors  Denies constipation or diarrhea  Energy has improved with exercise  No breastfeed  anymore   Ergocalciferol 50,000 IU weekly- not taking      HISTORY:  Past Medical History:  Past Medical History:  Diagnosis Date   Allergy    Anemia    GERD (gastroesophageal reflux disease)    h/o with preganancy only   History of migraine    HPV in female    Hyperthyroidism 08/07/2014   Supervision of high risk pregnancy, antepartum 06/28/2019   Clinic [facility] Prenatal Labs  Dating 6 week ultrasound Blood type: O/Negative/-- (05/20 1121)   Genetic Screen Declines AFP: [ ]  Antibody:Negative (10/27 1531) positive on  1/14- weak Anti D antibody noted.  Anatomic [place] Normal female Rubella: 3.58 (05/20 1121)  Varicella: Immune  GTT   Third 103 trimester:  RPR: Non Reactive (10/27 1531)   Rhogam [x ] 28 weeks. Given 11/23 HBsAg: Negative (05/20 112   Past Surgical History:  Past Surgical History:  Procedure Laterality Date   KNEE SURGERY Right 02/09/2004   LAPAROSCOPIC TUBAL LIGATION Bilateral 03/01/2022   Procedure: LAPAROSCOPIC FILSHIE CLIP PLACEMENT FOR PERMANENT STERILIZATION;  Surgeon: Linzie Collin, MD;  Location: [facility] ORS;  Service: Gynecology;  Laterality: Bilateral;  [id number]   TONSILLECTOMY     At age 75   WISDOM TOOTH EXTRACTION     all four    Social History:  reports that she quit smoking about 2 years ago. Her smoking use included e-cigarettes. She has never used smokeless tobacco. She reports that she does not drink alcohol and does not use drugs. Family History: family history includes Breast cancer in her maternal grandmother and paternal aunt; Cancer in her maternal grandfather; Hypertension in her mother; Ovarian cysts in her mother; Thyroid cancer in her maternal aunt.   HOME MEDICATIONS: Allergies as of 03/24/2023       Reactions   Penicillins Hives        Medication List        Accurate as of March 24, 2023 11:34 AM. If you have any questions, ask your nurse or doctor.  acetaminophen 325 MG tablet Commonly known as: TYLENOL Take 650 mg by mouth every 6 (six) hours as needed.   acetaminophen 500 MG tablet Commonly known as: TYLENOL Take 2 tablets (1,000 mg total) by mouth every 6 (six) hours as needed for mild pain.   ergocalciferol 1.25 MG (50000 UT) capsule Commonly known as: VITAMIN D2 Take 1 capsule (50,000 Units total) by mouth once a week.   multivitamin-prenatal 27-0.8 MG Tabs tablet Take 1 tablet by mouth daily at 12 noon.   naloxone 4 MG/0.1ML Liqd nasal spray kit Commonly known as: NARCAN Place 0.4 mg into the nose once.          REVIEW  OF SYSTEMS: A comprehensive ROS was conducted with the patient and is negative except as per HPI     OBJECTIVE:  VS: BP 118/74 (BP Location: Left Arm, Patient Position: Sitting, Cuff Size: Large)   Pulse 84   Ht 5\' 8"  (1.727 m)   Wt 262 lb (118.8 kg)   SpO2 96%   BMI 39.84 kg/m    Wt Readings from Last 3 Encounters:  03/24/23 262 lb (118.8 kg)  09/21/22 253 lb (114.8 kg)  05/20/22 242 lb 1.6 oz (109.8 kg)     EXAM: General: Pt appears well and is in NAD  Neck: General: Supple without adenopathy. Thyroid: Thyroid size normal.  No goiter or nodules appreciated.   Lungs: Clear with good BS bilat   Heart: Auscultation: RRR.  Abdomen: Soft, nontender  Extremities:  BL LE: No pretibial edema   Mental Status: Judgment, insight: Intact Orientation: Oriented to time, place, and person Mood and affect: No depression, anxiety, or agitation     DATA REVIEWED:     Latest Reference Range & Units 09/21/22 15:16  VITD 30.00 - 100.00 ng/mL 17.18 (L)  (L): Data is abnormally low  Latest Reference Range & Units 09/21/22 15:16  TSH 0.35 - 5.50 uIU/mL 2.37  Triiodothyronine,Free,Serum 2.3 - 4.2 pg/mL 3.1  T4,Free(Direct) 0.60 - 1.60 ng/dL 6.29    ASSESSMENT/PLAN/RECOMMENDATIONS:   Hashimoto's Disease:  -No local neck symptoms -She required levothyroxine 50 mcg during her third pregnancy in 2023.  She has not required any LT-4 replacement since delivery -Repeat TFTs ****  2.  Vitamin D deficiency:  -She has not been taking ergocalciferol, patient wanted to see if she is still deficient, I did explain to the patient that once people are deficient in vitamin D that will continue to be deficient as the main source of vitamin D is the sun, I am most advised to not get exposing of.   Follow-up in 1 yr  Patient encouraged to establish with a PCP  Signed electronically by: Lyndle Herrlich, MD  [facility] Endocrinology  [facility] [address] [place], [place] [address] Phone: [phone] FAX: [phone]   CC: Pcp, No No address on file Phone: None Fax: None   Return to Endocrinology clinic as below: No future appointments.

## 2023-03-25 ENCOUNTER — Encounter: Payer: Self-pay | Admitting: Internal Medicine

## 2023-03-25 LAB — T4, FREE: Free T4: 1.1 ng/dL (ref 0.8–1.8)

## 2023-03-25 LAB — T3, FREE: T3, Free: 2.8 pg/mL (ref 2.3–4.2)

## 2023-03-25 LAB — TSH: TSH: 2.83 m[IU]/L

## 2023-03-25 LAB — VITAMIN D 25 HYDROXY (VIT D DEFICIENCY, FRACTURES): Vit D, 25-Hydroxy: 27 ng/mL — ABNORMAL LOW (ref 30–100)

## 2023-05-19 NOTE — Progress Notes (Signed)
 Chief Complaint  Patient presents with   Gynecologic Exam    No concerns     HPI:      Brenda Villegas is a 33 y.o. G1P1001 who LMP was Patient's last menstrual period was 05/12/2023 (exact date)., presents today for her annual examination.  Her menses are monthly, lasting 3-4 days, mod flow, no BTB.  Dysmenorrhea mild, occas takes midol with sx relief.   Sex activity: single partner, contraception BTL. No pain/bleeding/dryness. 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 Suzann Ernst, LEEP not done but had neg pap repeat 3/23 and 4/24.  Hx of STDs: HPV  There is a FH of breast cancer in her pat aunt, mat great aunt and MGGM, genetic testing not indicated for pt. Pt's mom is MyRisk neg. There is no FH of ovarian cancer. The patient does not do self-breast exams.  Tobacco use: none Alcohol use: none  Drug use: none Exercise: mod active  She does get adequate calcium and some Vitamin D in her diet. Having issues with panic attacks and anxiety; can usually calm herself down but has upcoming PCP appt. Hasn't seen therapist yet. Anxiety started for first time with 3rd pregnancy.    Past Medical History:  Diagnosis Date   Allergy    Anemia    GERD (gastroesophageal reflux disease)    h/o with preganancy only   History of migraine    HPV in female    Hyperthyroidism 08/07/2014   Supervision of high risk pregnancy, antepartum 06/28/2019   Clinic [facility] Prenatal Labs  Dating 6 week ultrasound Blood type: O/Negative/-- (05/20 1121)   Genetic Screen Declines AFP: [ ]  Antibody:Negative (10/27 1531) positive on 1/14- weak Anti D antibody noted.  Anatomic US  Normal female Rubella: 3.58 (05/20 1121)  Varicella: Immune  GTT   Third 103 trimester:  RPR: Non Reactive (10/27 1531)   Rhogam [x ] 28 weeks. Given 11/23 HBsAg: Negative (05/20 112    Past Surgical History:  Procedure Laterality Date   CHOLECYSTECTOMY  04/2022   KNEE  SURGERY Right 02/09/2004   LAPAROSCOPIC TUBAL LIGATION Bilateral 03/01/2022   Procedure: LAPAROSCOPIC FILSHIE CLIP PLACEMENT FOR PERMANENT STERILIZATION;  Surgeon: Zenobia Hila, MD;  Location: [facility] ORS;  Service: Gynecology;  Laterality: Bilateral;  [id number]   TONSILLECTOMY     At age 78   WISDOM TOOTH EXTRACTION     all four    Family History  Problem Relation Age of Onset   Hypertension Mother    Ovarian cysts Mother        removed cyst on one side in 2018   Esophageal cancer Maternal Grandfather    Thyroid cancer Maternal Aunt    Breast cancer Paternal Aunt        ? age, most likely over 54   Breast cancer Other    Pancreatic cancer Other        late years   Asthma Neg Hx    Heart disease Neg Hx    Diabetes Neg Hx    Stroke Neg Hx     Social History   Socioeconomic History   Marital status: Married    Spouse name: Josh   Number of children: Not on file   Years of education: Not on file   Highest education level: Not on file  Occupational History   Not on file  Tobacco Use   Smoking status: Former  Types: E-cigarettes    Quit date: 03/24/2021    Years since quitting: 2.1   Smokeless tobacco: Never  Vaping Use   Vaping status: Former  Substance and Sexual Activity   Alcohol use: No    Alcohol/week: 0.0 standard drinks of alcohol   Drug use: No   Sexual activity: Yes    Partners: Male    Birth control/protection: Surgical    Comment: BTL 02/2022  Other Topics Concern   Not on file  Social History Narrative   Not on file   Social Drivers of Health   Financial Resource Strain: Not on file  Food Insecurity: No Food Insecurity (04/28/2022)   Hunger Vital Sign    Worried About Running Out of Food in the Last Year: Never true    Ran Out of Food in the Last Year: Never true  Transportation Needs: No Transportation Needs (04/28/2022)   PRAPARE - Administrator, Civil Service (Medical): No    Lack of Transportation (Non-Medical): No  Physical  Activity: Not on file  Stress: Not on file  Social Connections: Unknown (06/08/2021)   [facility]   Social Network    Social Network: Not on file  Intimate Partner Violence: Not At Risk (04/28/2022)   Humiliation, Afraid, Rape, and Kick questionnaire    Fear of Current or Ex-Partner: No    Emotionally Abused: No    Physically Abused: No    Sexually Abused: No     Current Outpatient Medications:    Multiple Vitamin (MULTIVITAMIN PO), Take by mouth., Disp: , Rfl:   ROS:  Review of Systems  Constitutional:  Negative for fatigue, fever and unexpected weight change.  Respiratory:  Negative for cough, shortness of breath and wheezing.   Cardiovascular:  Negative for chest pain, palpitations and leg swelling.  Gastrointestinal:  Negative for blood in stool, constipation, diarrhea, nausea and vomiting.  Endocrine: Negative for cold intolerance, heat intolerance and polyuria.  Genitourinary:  Negative for dyspareunia, dysuria, flank pain, frequency, genital sores, hematuria, menstrual problem, pelvic pain, urgency, vaginal bleeding, vaginal discharge and vaginal pain.  Musculoskeletal:  Negative for back pain, joint swelling and myalgias.  Skin:  Negative for rash.  Neurological:  Negative for dizziness, syncope, light-headedness, numbness and headaches.  Hematological:  Negative for adenopathy.  Psychiatric/Behavioral:  Positive for agitation. Negative for confusion, sleep disturbance and suicidal ideas. The patient is not nervous/anxious.      Objective: BP (!) 107/59   Pulse 65   Ht 5\' 8"  (1.727 m)   Wt 260 lb (117.9 kg)   LMP 05/12/2023 (Exact Date)   Breastfeeding No   BMI 39.53 kg/m    Physical Exam Constitutional:      Appearance: She is well-developed.  Genitourinary:     Vulva normal.     Right Labia: No rash, tenderness or lesions.    Left Labia: No tenderness, lesions or rash.    No vaginal discharge, erythema or tenderness.       Right Adnexa: not tender and no mass present.    Left Adnexa: not tender and no mass present.    No cervical motion tenderness, friability or polyp.     Uterus is not enlarged or tender.  Breasts:    Right: No mass, nipple discharge, skin change or tenderness.     Left: No mass, nipple discharge, skin change or tenderness.  Neck:     Thyroid: No thyromegaly.  Cardiovascular:     Rate and  Rhythm: Normal rate and regular rhythm.     Heart sounds: Normal heart sounds. No murmur heard. Pulmonary:     Effort: Pulmonary effort is normal.     Breath sounds: Normal breath sounds.  Abdominal:     Palpations: Abdomen is soft.     Tenderness: There is no abdominal tenderness. There is no guarding or rebound.  Musculoskeletal:        General: Normal range of motion.     Cervical back: Normal range of motion.  Lymphadenopathy:     Cervical: No cervical adenopathy.  Neurological:     General: No focal deficit present.     Mental Status: She is alert and oriented to person, place, and time.     Cranial Nerves: No cranial nerve deficit.  Skin:    General: Skin is warm and dry.  Psychiatric:        Mood and Affect: Mood normal.        Behavior: Behavior normal.        Thought Content: Thought content normal.        Judgment: Judgment normal.  Vitals reviewed.     Assessment/Plan: Encounter for annual routine gynecological examination  Cervical cancer screening - Plan: Cytology - PAP  Screening for HPV (human papillomavirus) - Plan: Cytology - PAP  High grade squamous intraepithelial lesion (HGSIL), grade 3 CIN, on biopsy of cervix - Plan: Cytology - PAP; repeat pap today, will f/u if abn.   Anxiety--has upcoming PCP appt. Recommended therapist since has some triggers.            GYN counsel adequate intake of calcium and vitamin D, diet and exercise     F/U  Return in about 1 year (around 05/22/2024).  Teng Decou B. Glanda Spanbauer, PA-C 05/23/2023 4:26 PM

## 2023-05-23 ENCOUNTER — Ambulatory Visit (INDEPENDENT_AMBULATORY_CARE_PROVIDER_SITE_OTHER): Admitting: Obstetrics and Gynecology

## 2023-05-23 ENCOUNTER — Encounter: Payer: Self-pay | Admitting: Obstetrics and Gynecology

## 2023-05-23 ENCOUNTER — Other Ambulatory Visit (HOSPITAL_COMMUNITY)
Admission: RE | Admit: 2023-05-23 | Discharge: 2023-05-23 | Disposition: A | Discharge status: Home / Self Care | Source: Ambulatory Visit | Attending: Obstetrics and Gynecology | Admitting: Obstetrics and Gynecology

## 2023-05-23 VITALS — BP 107/59 | HR 65 | Ht 68.0 in | Wt 260.0 lb

## 2023-05-23 DIAGNOSIS — D069 Carcinoma in situ of cervix, unspecified: Secondary | ICD-10-CM | POA: Insufficient documentation

## 2023-05-23 DIAGNOSIS — Z1151 Encounter for screening for human papillomavirus (HPV): Secondary | ICD-10-CM | POA: Diagnosis present

## 2023-05-23 DIAGNOSIS — Z124 Encounter for screening for malignant neoplasm of cervix: Secondary | ICD-10-CM

## 2023-05-23 DIAGNOSIS — F419 Anxiety disorder, unspecified: Secondary | ICD-10-CM

## 2023-05-23 DIAGNOSIS — Z01419 Encounter for gynecological examination (general) (routine) without abnormal findings: Secondary | ICD-10-CM | POA: Diagnosis not present

## 2023-05-23 NOTE — Patient Instructions (Signed)
 I value your feedback and you entrusting [place] with your care. If you get a [place] patient survey, I would appreciate you taking the time to let us know about your experience today. Thank you! ? ? ?

## 2023-05-27 LAB — CYTOLOGY - PAP
Comment: NEGATIVE
Diagnosis: NEGATIVE
High risk HPV: NEGATIVE

## 2023-06-09 ENCOUNTER — Ambulatory Visit: Admitting: Physician Assistant

## 2023-06-09 ENCOUNTER — Encounter: Payer: Self-pay | Admitting: Physician Assistant

## 2023-06-09 VITALS — BP 115/61 | HR 69 | Temp 98.6°F | Ht 68.0 in | Wt 256.0 lb

## 2023-06-09 DIAGNOSIS — E063 Autoimmune thyroiditis: Secondary | ICD-10-CM | POA: Diagnosis not present

## 2023-06-09 DIAGNOSIS — D649 Anemia, unspecified: Secondary | ICD-10-CM

## 2023-06-09 DIAGNOSIS — F419 Anxiety disorder, unspecified: Secondary | ICD-10-CM

## 2023-06-09 DIAGNOSIS — B977 Papillomavirus as the cause of diseases classified elsewhere: Secondary | ICD-10-CM

## 2023-06-09 DIAGNOSIS — Z8669 Personal history of other diseases of the nervous system and sense organs: Secondary | ICD-10-CM

## 2023-06-09 DIAGNOSIS — T7840XD Allergy, unspecified, subsequent encounter: Secondary | ICD-10-CM

## 2023-06-09 DIAGNOSIS — L719 Rosacea, unspecified: Secondary | ICD-10-CM

## 2023-06-09 DIAGNOSIS — E559 Vitamin D deficiency, unspecified: Secondary | ICD-10-CM | POA: Diagnosis not present

## 2023-06-09 DIAGNOSIS — Z7689 Persons encountering health services in other specified circumstances: Secondary | ICD-10-CM

## 2023-06-09 DIAGNOSIS — J3089 Other allergic rhinitis: Secondary | ICD-10-CM | POA: Diagnosis not present

## 2023-06-09 DIAGNOSIS — R5383 Other fatigue: Secondary | ICD-10-CM

## 2023-06-09 DIAGNOSIS — E669 Obesity, unspecified: Secondary | ICD-10-CM

## 2023-06-09 NOTE — Progress Notes (Unsigned)
 New patient visit  Patient: Brenda Villegas   DOB: 02-Jan-1991   32 y.o. Female  MRN: [id number] Visit Date: 06/09/2023  Today's healthcare provider: Blane Bunting, PA-C   Chief Complaint  Patient presents with   Establish Care   Subjective    Brenda Villegas is a 33 y.o. female who presents today as a new patient to establish care.  HPI  *** Discussed the use of AI scribe software for clinical note transcription with the patient, who gave verbal consent to proceed.  History of Present Illness Brenda Villegas is a 34 year old female who presents for establishing care and evaluation of anxiety and vitamin D  deficiency.  She experiences anxiety since her last pregnancy, with episodes of anxiety attacks characterized by a 'whoosh feeling of tingle' from her head to her body, often occurring two days before her menstrual period. She is not on medication for anxiety and prefers to avoid it unless necessary. She is concerned about hormonal imbalances, particularly cortisol levels, but has regular menstrual periods and no known polycystic ovary syndrome.  She has a vitamin D  deficiency noted during her last endocrinologist visit. She has Hashimoto's thyroiditis diagnosed after her first pregnancy but is not on levothyroxine  as her thyroid  levels have been normal since February.  She has a history of migraines, which have decreased since stopping birth control and having her tubes tied, now experiencing only occasional headaches. She has allergies and is currently experiencing symptoms. She denies gastroesophageal reflux disease but had severe morning sickness during her last pregnancy.  She has a family history of heart conditions, with her mother suggested to have an underlying heart condition, and is concerned about her own risk. She experiences fatigue, attributed to her busy life with young children, but it is not incapacitating. She has three children, aged almost ten, three,  and one and a [age] years old.  She has a history of anemia during pregnancies but denies current symptoms. She has skin issues, including red cheeks, suspected to be rosacea, and a spot on her back for dermatological evaluation.     06/09/2023    2:51 PM 10/02/2020   10:47 AM 04/04/2020    9:05 AM  PHQ9 SCORE ONLY  PHQ-9 Total Score 3 2 3       06/09/2023    2:51 PM 10/02/2020   10:48 AM 04/04/2020    9:06 AM  GAD 7 : Generalized Anxiety Score  Nervous, Anxious, on Edge 1 0 1  Control/stop worrying 1 0 0  Worry too much - different things 1 1 1   Trouble relaxing 0 0 0  Restless 0 0 0  Easily annoyed or irritable 1 0 0  Afraid - awful might happen 1 0 0  Total GAD 7 Score 5 1 2   Anxiety Difficulty Somewhat difficult Not difficult at all       Past Medical History:  Diagnosis Date   Allergy    Anemia    GERD (gastroesophageal reflux disease)    h/o with preganancy only   History of migraine    HPV in female    Hyperthyroidism 08/07/2014   Supervision of high risk pregnancy, antepartum 06/28/2019   Clinic [facility] Prenatal Labs  Dating 6 week ultrasound Blood type: O/Negative/-- (05/20 1121)   Genetic Screen Declines AFP: [ ]  Antibody:Negative (10/27 1531) positive on 1/14- weak Anti D antibody noted.  Anatomic US  Normal female Rubella: 3.58 (05/20 1121)  Varicella: Immune  GTT   Third 103 trimester:  RPR: Non Reactive (10/27 1531)   Rhogam [x ] 28 weeks. Given 11/23 HBsAg: Negative (05/20 112   Past Surgical History:  Procedure Laterality Date   CHOLECYSTECTOMY  04/2022   KNEE SURGERY Right 02/09/2004   LAPAROSCOPIC TUBAL LIGATION Bilateral 03/01/2022   Procedure: LAPAROSCOPIC FILSHIE CLIP PLACEMENT FOR PERMANENT STERILIZATION;  Surgeon: Zenobia Hila, MD;  Location: [facility] ORS;  Service: Gynecology;  Laterality: Bilateral;  78469   TONSILLECTOMY     At age 10   TUBAL LIGATION     WISDOM TOOTH EXTRACTION     all four   Family Status  Relation Name Status   Mother  Alive    Father  Alive   Mat Aunt  (Not Specified)   Pat Aunt  Alive   MGM  (Not Specified)   MGF  Deceased   Other MGGM Deceased   Other MGaunt Alive   Neg Hx  (Not Specified)  No partnership data on file   Family History  Problem Relation Age of Onset   Hyperlipidemia Mother    Hypertension Mother    Ovarian cysts Mother        removed cyst on one side in 2018   Thyroid  disease Mother    Bipolar disorder Father    Thyroid  cancer Maternal Aunt    Breast cancer Paternal Aunt        ? age, most likely over 54   Thyroid  disease Maternal Grandmother    Cancer Maternal Grandfather    Esophageal cancer Maternal Grandfather    Breast cancer Other    Pancreatic cancer Other        late years   Asthma Neg Hx    Heart disease Neg Hx    Diabetes Neg Hx    Stroke Neg Hx    Social History   Socioeconomic History   Marital status: Married    Spouse name: Josh   Number of children: Not on file   Years of education: Not on file   Highest education level: Some college, no degree  Occupational History   Not on file  Tobacco Use   Smoking status: Former    Types: E-cigarettes    Quit date: 03/24/2021    Years since quitting: 2.2   Smokeless tobacco: Never  Vaping Use   Vaping status: Former  Substance and Sexual Activity   Alcohol use: No    Alcohol/week: 0.0 standard drinks of alcohol   Drug use: No   Sexual activity: Yes    Partners: Male    Birth control/protection: Surgical    Comment: BTL 02/2022  Other Topics Concern   Not on file  Social History Narrative   Not on file   Social Drivers of Health   Financial Resource Strain: Low Risk  (06/08/2023)   Overall Financial Resource Strain (CARDIA)    Difficulty of Paying Living Expenses: Not hard at all  Food Insecurity: No Food Insecurity (06/08/2023)   Hunger Vital Sign    Worried About Running Out of Food in the Last Year: Never true    Ran Out of Food in the Last Year: Never true  Transportation Needs: No  Transportation Needs (06/08/2023)   PRAPARE - Administrator, Civil Service (Medical): No    Lack of Transportation (Non-Medical): No  Physical Activity: Insufficiently Active (06/08/2023)   Exercise Vital Sign    Days of Exercise per Week: 3 days    Minutes of Exercise per Session: 30 min  Stress: No  Stress Concern Present (06/08/2023)   [employer] of Occupational Health - Occupational Stress Questionnaire    Feeling of Stress : Only a little  Social Connections: Moderately Integrated (06/08/2023)   Social Connection and Isolation Panel [NHANES]    Frequency of Communication with Friends and Family: More than three times a week    Frequency of Social Gatherings with Friends and Family: Once a week    Attends Religious Services: 1 to 4 times per year    Active Member of [employer] or Organizations: No    Attends Engineer, structural: Not on file    Marital Status: Married   Outpatient Medications Prior to Visit  Medication Sig   Multiple Vitamin (MULTIVITAMIN PO) Take by mouth.   No facility-administered medications prior to visit.   Allergies  Allergen Reactions   Penicillins Hives    Immunization History  Administered Date(s) Administered   Influenza,inj,Quad PF,6+ Mos 12/21/2019   Rho (D) Immune Globulin  01/01/2020   Tdap 12/21/2019, 11/11/2021    Health Maintenance  Topic Date Due   COVID-19 Vaccine (1 - 2024-25 season) Never done   INFLUENZA VACCINE  09/09/2023   Cervical Cancer Screening (HPV/Pap Cotest)  05/22/2028   DTaP/Tdap/Td (3 - Td or Tdap) 11/12/2031   Hepatitis C Screening  Completed   HIV Screening  Completed   HPV VACCINES  Aged Out   Meningococcal B Vaccine  Aged Out    Patient Care Team: Saraiyah Hemminger, PA-C as PCP - General (Physician Assistant) Abner Ables, MD as PCP - OBGYN (Obstetrics and Gynecology)  Review of Systems Except see HPI   {Insert previous labs (optional):23779} {See past labs  Heme  Chem   Endocrine  Serology  Results Review (optional):1}   Objective    BP 115/61 (BP Location: Right Arm, Patient Position: Sitting, Cuff Size: Large)   Pulse 69   Temp 98.6 F (37 C) (Oral)   Ht 5\' 8"  (1.727 m)   Wt 256 lb (116.1 kg)   LMP 05/12/2023 (Exact Date)   SpO2 99%   BMI 38.92 kg/m  {Insert last BP/Wt (optional):23777}{See vitals history (optional):1}   Physical Exam  Depression Screen    06/09/2023    2:51 PM 10/02/2020   10:47 AM 04/04/2020    9:05 AM 08/02/2014    1:47 PM  PHQ 2/9 Scores  PHQ - 2 Score 0 0 1 2  PHQ- 9 Score 3 2 3 12    No results found for any visits on 06/09/23.  Assessment & Plan     *** Assessment and Plan Assessment & Plan Wellness Visit Routine wellness visit to establish care. Discussed health concerns including anxiety, vitamin D  deficiency, and rosacea. Hashimoto's thyroiditis with normal thyroid  function tests. - Schedule wellness check in one month. - Perform blood work including vitamin D  and B12 levels. - Assess for anemia and other deficiencies.  Anxiety disorder Anxiety symptoms post-pregnancy, persisting for two years. Prefers non-pharmacological interventions. Discussed potential link with vitamin D  deficiency and hormonal imbalances. Declines medication. - Recommend therapy sessions, possibly through [employer] or church counseling services.  Vitamin D  deficiency Persistent vitamin D  deficiency noted by endocrinologist, potentially contributing to anxiety symptoms. - Check vitamin D  levels during blood work.  Hashimoto's thyroiditis Hashimoto's thyroiditis with normal thyroid  function tests. No medication required per recent endocrinologist evaluation.  Rosacea Persistent facial erythema, likely rosacea. Dermatology referral needed for further evaluation and management. - Refer to dermatology for evaluation and management of rosacea.  Allergic rhinitis Current symptoms  of allergic rhinitis.     Encounter to establish  care Welcomed to our clinic Reviewed past medical hx, social hx, family hx and surgical hx Pt advised to send all vaccination records or screening   No follow-ups on file.    The patient was advised to call back or seek an in-person evaluation if the symptoms worsen or if the condition fails to improve as anticipated.  I discussed the assessment and treatment plan with the patient. The patient was provided an opportunity to ask questions and all were answered. The patient agreed with the plan and demonstrated an understanding of the instructions.  I, Bubba Vanbenschoten, PA-C have reviewed all documentation for this visit. The documentation on  06/09/2023   for the exam, diagnosis, procedures, and orders are all accurate and complete.  Blane Bunting, [facility], MMS [facility] [phone] (phone) [phone] (fax)  [facility]

## 2023-06-11 LAB — CBC WITH DIFFERENTIAL/PLATELET
Basophils Absolute: 0.1 10*3/uL (ref 0.0–0.2)
Basos: 1 %
EOS (ABSOLUTE): 0.9 10*3/uL — ABNORMAL HIGH (ref 0.0–0.4)
Eos: 10 %
Hematocrit: 41.9 % (ref 34.0–46.6)
Hemoglobin: 14 g/dL (ref 11.1–15.9)
Immature Grans (Abs): 0 10*3/uL (ref 0.0–0.1)
Immature Granulocytes: 0 %
Lymphocytes Absolute: 2.1 10*3/uL (ref 0.7–3.1)
Lymphs: 23 %
MCH: 28.4 pg (ref 26.6–33.0)
MCHC: 33.4 g/dL (ref 31.5–35.7)
MCV: 85 fL (ref 79–97)
Monocytes Absolute: 0.5 10*3/uL (ref 0.1–0.9)
Monocytes: 5 %
Neutrophils Absolute: 5.7 10*3/uL (ref 1.4–7.0)
Neutrophils: 61 %
Platelets: 189 10*3/uL (ref 150–450)
RBC: 4.93 x10E6/uL (ref 3.77–5.28)
RDW: 13.3 % (ref 11.7–15.4)
WBC: 9.3 10*3/uL (ref 3.4–10.8)

## 2023-06-11 LAB — COMPREHENSIVE METABOLIC PANEL WITH GFR
ALT: 12 IU/L (ref 0–32)
AST: 14 IU/L (ref 0–40)
Albumin: 4.3 g/dL (ref 3.9–4.9)
Alkaline Phosphatase: 160 IU/L — ABNORMAL HIGH (ref 44–121)
BUN/Creatinine Ratio: 9 (ref 9–23)
BUN: 7 mg/dL (ref 6–20)
Bilirubin Total: 0.5 mg/dL (ref 0.0–1.2)
CO2: 22 mmol/L (ref 20–29)
Calcium: 9.6 mg/dL (ref 8.7–10.2)
Chloride: 104 mmol/L (ref 96–106)
Creatinine, Ser: 0.82 mg/dL (ref 0.57–1.00)
Globulin, Total: 2.8 g/dL (ref 1.5–4.5)
Glucose: 77 mg/dL (ref 70–99)
Potassium: 4.3 mmol/L (ref 3.5–5.2)
Sodium: 140 mmol/L (ref 134–144)
Total Protein: 7.1 g/dL (ref 6.0–8.5)
eGFR: 97 mL/min/{1.73_m2} (ref >59)

## 2023-06-11 LAB — LIPID PANEL
Chol/HDL Ratio: 4.8 ratio — ABNORMAL HIGH (ref 0.0–4.4)
Cholesterol, Total: 193 mg/dL (ref 100–199)
HDL: 40 mg/dL (ref >39)
LDL Chol Calc (NIH): 133 mg/dL — ABNORMAL HIGH (ref 0–99)
Triglycerides: 109 mg/dL (ref 0–149)
VLDL Cholesterol Cal: 20 mg/dL (ref 5–40)

## 2023-06-11 LAB — VITAMIN D 25 HYDROXY (VIT D DEFICIENCY, FRACTURES): Vit D, 25-Hydroxy: 22.4 ng/mL — ABNORMAL LOW (ref 30.0–100.0)

## 2023-06-11 LAB — HEMOGLOBIN A1C
Est. average glucose Bld gHb Est-mCnc: 108 mg/dL
Hgb A1c MFr Bld: 5.4 % (ref 4.8–5.6)

## 2023-06-11 LAB — TSH: TSH: 2.87 u[IU]/mL (ref 0.450–4.500)

## 2023-06-11 LAB — VITAMIN B12: Vitamin B-12: 452 pg/mL (ref 232–1245)

## 2023-06-12 ENCOUNTER — Encounter: Payer: Self-pay | Admitting: Physician Assistant

## 2023-06-12 DIAGNOSIS — E669 Obesity, unspecified: Secondary | ICD-10-CM | POA: Insufficient documentation

## 2023-06-12 DIAGNOSIS — F419 Anxiety disorder, unspecified: Secondary | ICD-10-CM | POA: Insufficient documentation

## 2023-06-12 DIAGNOSIS — L719 Rosacea, unspecified: Secondary | ICD-10-CM | POA: Insufficient documentation

## 2023-06-12 DIAGNOSIS — J309 Allergic rhinitis, unspecified: Secondary | ICD-10-CM | POA: Insufficient documentation

## 2023-06-13 ENCOUNTER — Encounter: Payer: Self-pay | Admitting: Physician Assistant

## 2023-07-19 ENCOUNTER — Encounter: Payer: Self-pay | Admitting: Physician Assistant

## 2023-07-19 ENCOUNTER — Ambulatory Visit: Admitting: Physician Assistant

## 2023-07-19 VITALS — BP 106/56 | HR 72 | Resp 16 | Ht 68.0 in | Wt 256.0 lb

## 2023-07-19 DIAGNOSIS — E063 Autoimmune thyroiditis: Secondary | ICD-10-CM

## 2023-07-19 DIAGNOSIS — E669 Obesity, unspecified: Secondary | ICD-10-CM

## 2023-07-19 DIAGNOSIS — Z6838 Body mass index (BMI) 38.0-38.9, adult: Secondary | ICD-10-CM | POA: Diagnosis not present

## 2023-07-19 DIAGNOSIS — Z0001 Encounter for general adult medical examination with abnormal findings: Secondary | ICD-10-CM

## 2023-07-19 DIAGNOSIS — Z0000 Encounter for general adult medical examination without abnormal findings: Secondary | ICD-10-CM

## 2023-07-19 MED ORDER — SEMAGLUTIDE-WEIGHT MANAGEMENT 0.5 MG/0.5ML ~~LOC~~ SOAJ: 0.5000 mg | SUBCUTANEOUS | 1 refills | Status: AC

## 2023-07-19 MED ORDER — SEMAGLUTIDE-WEIGHT MANAGEMENT 0.25 MG/0.5ML ~~LOC~~ SOAJ: 0.2500 mg | SUBCUTANEOUS | 1 refills | Status: AC

## 2023-07-19 NOTE — Progress Notes (Signed)
 Complete physical exam  Patient: Brenda Villegas   DOB: September 10, 1990   32 y.o. Female  MRN: [id number] Visit Date: 07/19/2023  Today's healthcare provider: Blane Bunting, PA-C   Chief Complaint  Patient presents with   Annual Exam    CPE no other concerns   Subjective    Brenda Villegas is a 33 y.o. female who presents today for a complete physical exam.   Discussed the use of AI scribe software for clinical note transcription with the patient, who gave verbal consent to proceed.  History of Present Illness Brenda Villegas is a 33 year old female who presents for weight management and evaluation of thyroid  function. She is accompanied by her child.  She follows a weight management program using Herbalife products for breakfast and lunch, with portion-controlled dinners emphasizing higher protein and vegetable intake while reducing carbohydrates. Her weight has fluctuated between 255 and 256 pounds, starting from 260 pounds. She walks three times a week for exercise.  There is a family history of thyroid  issues, with a cousin diagnosed with hypothyroidism. Her thyroid  levels have been normal, although she experienced thyroid  function changes during pregnancy. No family history of thyroid  cancer.    Last depression screening scores    06/09/2023    2:51 PM 10/02/2020   10:47 AM 04/04/2020    9:05 AM  PHQ 2/9 Scores  PHQ - 2 Score 0 0 1  PHQ- 9 Score 3 2 3    Last fall risk screening    06/09/2023    2:51 PM  Fall Risk   Falls in the past year? 0  Number falls in past yr: 0  Injury with Fall? 0   Last Audit-C alcohol use screening    06/08/2023    4:23 PM  Alcohol Use Disorder Test (AUDIT)  1. How often do you have a drink containing alcohol? 1  2. How many drinks containing alcohol do you have on a typical day when you are drinking? 0  3. How often do you have six or more drinks on one occasion? 0  AUDIT-C Score 1      Patient-reported   A score of 3  or more in women, and 4 or more in men indicates increased risk for alcohol abuse, EXCEPT if all of the points are from question 1   Past Medical History:  Diagnosis Date   Allergy    Anemia    GERD (gastroesophageal reflux disease)    h/o with preganancy only   History of migraine    HPV in female    Hyperthyroidism 08/07/2014   Supervision of high risk pregnancy, antepartum 06/28/2019   Clinic [facility] P[facility]atal Labs  Dating 6 week ultrasound Blood type: O/Negative/-- (05/20 1121)   Genetic Screen Declines AFP: [ ]  Antibody:Negative (10/27 1531) positive on 1/14- weak Anti D antibody noted.  Anatomic US  Normal female Rubella: 3.58 (05/20 1121)  Varicella: Immune  GTT   Third 103 trimester:  RPR: Non Reactive (10/27 1531)   Rhogam [x ] 28 weeks. Given 11/23 HBsAg: Negative (05/20 112   Past Surgical History:  Procedure Laterality Date   CHOLECYSTECTOMY  04/2022   KNEE SURGERY Right 02/09/2004   LAPAROSCOPIC TUBAL LIGATION Bilateral 03/01/2022   Procedure: LAPAROSCOPIC FILSHIE CLIP PLACEMENT FOR PERMANENT STERILIZATION;  Surgeon: Zenobia Hila, MD;  Location: [facility] ORS;  Service: Gynecology;  Laterality: Bilateral;  13086   TONSILLECTOMY     At age 36   TUBAL LIGATION  WISDOM TOOTH EXTRACTION     all four   Social History   Socioeconomic History   Marital status: Married    Spouse name: Josh   Number of children: Not on file   Years of education: Not on file   Highest education level: Some college, no degree  Occupational History   Not on file  Tobacco Use   Smoking status: Former    Types: E-cigarettes    Quit date: 03/24/2021    Years since quitting: 2.3   Smokeless tobacco: Never  Vaping Use   Vaping status: Former  Substance and Sexual Activity   Alcohol use: No    Alcohol/week: 0.0 standard drinks of alcohol   Drug use: No   Sexual activity: Yes    Partners: Male    Birth control/protection: Surgical    Comment: BTL 02/2022  Other Topics Concern   Not on  file  Social History Narrative   Not on file   Social Drivers of Health   Financial Resource Strain: Low Risk  (06/15/2023)   Received from [facility]   Overall Financial Resource Strain (CARDIA)    Difficulty of Paying Living Expenses: Not hard at all  Food Insecurity: No Food Insecurity (06/15/2023)   [facility] from [facility]   Hunger Vital Sign    Worried About Running Out of Food in the Last Year: Never true    Ran Out of Food in the Last Year: Never true  Transportation Needs: No Transportation Needs (06/15/2023)   Received from [facility] - Transportation    In the past 12 months, has lack of transportation kept you from medical appointments or from getting medications?: No    Lack of Transportation (Non-Medical): No  Physical Activity: Insufficiently Active (06/08/2023)   Exercise Vital Sign    Days of Exercise per Week: 3 days    Minutes of Exercise per Session: 30 min  Stress: No Stress Concern Present (06/08/2023)   [employer] of Occupational Health - Occupational Stress Questionnaire    Feeling of Stress : Only a little  Social Connections: Moderately Integrated (06/08/2023)   Social Connection and Isolation Panel [NHANES]    Frequency of Communication with Friends and Family: More than three times a week    Frequency of Social Gatherings with Friends and Family: Once a week    Attends Religious Services: 1 to 4 times per year    Active Member of [employer] or Organizations: No    Attends Engineer, structural: Not on file    Marital Status: Married  Catering manager Violence: Not At Risk (04/28/2022)   Humiliation, Afraid, Rape, and Kick questionnaire    Fear of Current or Ex-Partner: No    Emotionally Abused: No    Physically Abused: No    Sexually Abused: No   Family Status  Relation Name Status   Mother  Alive   Father  Alive   Mat Aunt  (Not Specified)   Pat Aunt  Alive   MGM  (Not  Specified)   MGF  Deceased   Other MGGM Deceased   Other MGaunt Alive   Neg Hx  (Not Specified)  No partnership data on file   Family History  Problem Relation Age of Onset   Hyperlipidemia Mother    Hypertension Mother    Ovarian cysts Mother        removed cyst on one side in 2018   Thyroid  disease Mother  Bipolar disorder Father    Thyroid  cancer Maternal Aunt    Breast cancer Paternal Aunt        ? age, most likely over 33   Thyroid  disease Maternal Grandmother    Cancer Maternal Grandfather    Esophageal cancer Maternal Grandfather    Breast cancer Other    Pancreatic cancer Other        late years   Asthma Neg Hx    Heart disease Neg Hx    Diabetes Neg Hx    Stroke Neg Hx    Allergies  Allergen Reactions   Penicillins Hives    Patient Care Team: Cinzia Devos, PA-C as PCP - General (Physician Assistant) Abner Ables, MD as PCP - OBGYN (Obstetrics and Gynecology)   Medications: Outpatient Medications Prior to Visit  Medication Sig   Multiple Vitamin (MULTIVITAMIN PO) Take by mouth.   No facility-administered medications prior to visit.    Review of Systems  All other systems reviewed and are negative.  Except see HPI     Objective    BP (!) 106/56 (BP Location: Left Arm, Patient Position: Sitting, Cuff Size: Large)   Pulse 72   Resp 16   Ht 5\' 8"  (1.727 m)   Wt 256 lb (116.1 kg)   SpO2 100%   BMI 38.92 kg/m      Physical Exam Vitals reviewed.  Constitutional:      General: She is not in acute distress.    Appearance: Normal appearance. She is well-developed. She is not ill-appearing, toxic-appearing or diaphoretic.  HENT:     Head: Normocephalic and atraumatic.     Right Ear: Tympanic membrane, ear canal and external ear normal.     Left Ear: Tympanic membrane, ear canal and external ear normal.     Nose: Nose normal. No congestion or rhinorrhea.     Mouth/Throat:     Mouth: Mucous membranes are moist.     Pharynx:  Oropharynx is clear. No oropharyngeal exudate.  Eyes:     General: No scleral icterus.       Right eye: No discharge.        Left eye: No discharge.     Conjunctiva/sclera: Conjunctivae normal.     Pupils: Pupils are equal, round, and reactive to light.  Neck:     Thyroid : No thyromegaly.     Vascular: No carotid bruit.  Cardiovascular:     Rate and Rhythm: Normal rate and regular rhythm.     Pulses: Normal pulses.     Heart sounds: Normal heart sounds. No murmur heard.    No friction rub. No gallop.  Pulmonary:     Effort: Pulmonary effort is normal. No respiratory distress.     Breath sounds: Normal breath sounds. No wheezing or rales.  Abdominal:     General: Abdomen is flat. Bowel sounds are normal. There is no distension.     Palpations: Abdomen is soft. There is no mass.     Tenderness: There is no abdominal tenderness. There is no right CVA tenderness, left CVA tenderness, guarding or rebound.     Hernia: No hernia is present.  Musculoskeletal:        General: No swelling, tenderness, deformity or signs of injury. Normal range of motion.     Cervical back: Normal range of motion and neck supple. No rigidity or tenderness.     Right lower leg: No edema.     Left lower leg: No edema.  Lymphadenopathy:  Cervical: No cervical adenopathy.  Skin:    General: Skin is warm and dry.     Coloration: Skin is not jaundiced or pale.     Findings: No bruising, erythema, lesion or rash.  Neurological:     Mental Status: She is alert and oriented to person, place, and time. Mental status is at baseline.     Gait: Gait normal.  Psychiatric:        Mood and Affect: Mood normal.        Behavior: Behavior normal.        Thought Content: Thought content normal.        Judgment: Judgment normal.      No results found for any visits on 07/19/23.  Assessment & Plan    Routine Health Maintenance and Physical Exam  Exercise Activities and Dietary recommendations  Goals   None      Immunization History  Administered Date(s) Administered   Influenza,inj,Quad PF,6+ Mos 12/21/2019   Rho (D) Immune Globulin  01/01/2020   Tdap 12/21/2019, 11/11/2021    Health Maintenance  Topic Date Due   COVID-19 Vaccine (1 - 2024-25 season) Never done   Flu Shot  09/09/2023   Pap with HPV screening  05/22/2028   DTaP/Tdap/Td vaccine (3 - Td or Tdap) 11/12/2031   Hepatitis C Screening  Completed   HIV Screening  Completed   HPV Vaccine  Aged Out   Meningitis B Vaccine  Aged Out    Discussed health benefits of physical activity, and encouraged her to engage in regular exercise appropriate for her age and condition. Assessment & Plan Obesity Chronic Weight reduced from 260 lbs to 255-256 lbs with Herbalife and regular exercise. Discussed weight management importance and potential need for sustained lifestyle changes. Planned [facility] initiation for further weight loss. Explained gradual weight loss approach and Wegovy side effects. - Start [phone] for weight loss. - Monitor for [facility] side effects, including nausea and vomiting. - Continue current diet and exercise regimen. - Reassess weight management progress in six weeks.  Hypothyroidism/Thyroid  Nodule Screening Family history of thyroid  issues. Not on medication. Normal TSH levels. Recommended ultrasound to rule out nodules and establish baseline. - Order thyroid  ultrasound to rule out nodules.  General Health Maintenance Up to date with OBGYN exam and dental cleaning. Reports seasonal allergies with nighttime drainage. - Continue regular dental check-ups. - Manage seasonal allergies as needed.   Obesity (BMI 30-39.9) (Primary)  - US  THYROID ; Future - Semaglutide-Weight Management 0.25 MG/0.5ML SOAJ; Inject 0.25 mg into the skin once a week for 28 days.  Dispense: 2 mL; Refill: 1 - Semaglutide-Weight Management 0.5 MG/0.5ML SOAJ; Inject 0.5 mg into the skin once a week for 28 days.  Dispense: 2 mL; Refill:  1   Annual physical exam Well adult visit with abnormal findings Things to do to keep yourself healthy  - Exercise at least 30-45 minutes a day, 3-4 days a week.  - Eat a low-fat diet with lots of fruits and vegetables, up to 7-9 servings per day.  - Seatbelts can save your life. Wear them always.  - Smoke detectors on every level of your home, check batteries every year.  - Eye Doctor - have an eye exam every 1-2 years  - Safe sex - if you may be exposed to STDs, use a condom.  - Alcohol -  If you drink, do it moderately, less than 2 drinks per day.  - Health Care Power of Attorney. Choose someone to speak for  you if you are not able.  - Depression is common in our stressful world.If you're feeling down or losing interest in things you normally enjoy, please come in for a visit.  - Violence - If anyone is threatening or hurting you, please call immediately.   No follow-ups on file.    The patient was advised to call back or seek an in-person evaluation if the symptoms worsen or if the condition fails to improve as anticipated.  I discussed the assessment and treatment plan with the patient. The patient was provided an opportunity to ask questions and all were answered. The patient agreed with the plan and demonstrated an understanding of the instructions.  I, Sanyiah Kanzler, PA-C have reviewed all documentation for this visit. The documentation on 07/19/2023  for the exam, diagnosis, procedures, and orders are all accurate and complete.  Blane Bunting, [facility], MMS [facility] [phone] (phone) [phone] (fax)  [facility]

## 2023-07-21 ENCOUNTER — Other Ambulatory Visit: Payer: Self-pay

## 2023-07-21 ENCOUNTER — Other Ambulatory Visit (HOSPITAL_COMMUNITY): Payer: Self-pay

## 2023-07-21 ENCOUNTER — Telehealth: Payer: Self-pay | Admitting: Physician Assistant

## 2023-07-21 ENCOUNTER — Telehealth: Payer: Self-pay

## 2023-07-21 DIAGNOSIS — E669 Obesity, unspecified: Secondary | ICD-10-CM

## 2023-07-21 NOTE — Telephone Encounter (Signed)
 Pharmacy Patient Advocate Encounter   Received notification from CoverMyMeds that prior authorization for Wegovy 0.25MG /0.5ML auto-injectors  is required/requested.   Insurance verification completed.   The patient is insured through [employer] [facility] [employer] .   Per test claim: PA required; PA submitted to above mentioned insurance via CoverMyMeds Key/confirmation #/EOC [id number] Status is pending

## 2023-07-21 NOTE — Telephone Encounter (Signed)
 Pharmacy Patient Advocate Encounter  Received notification from [facility] that Prior Authorization for Wegovy 0.25MG /0.5ML auto-injectors  has been APPROVED from 07/21/23 to 01/17/24. Ran test claim, Copay is $4. This test claim was processed through [facility]- copay amounts may vary at other pharmacies due to pharmacy/plan contracts, or as the patient moves through the different stages of their insurance plan.   PA #/Case ID/Reference #: [id number]

## 2023-07-21 NOTE — Telephone Encounter (Signed)
 [facility] pharmacy is requesting refill Semaglutide-Weight Management 0.25 MG/0.5ML SOAJ   Please advise

## 2023-07-28 ENCOUNTER — Ambulatory Visit
Admission: RE | Admit: 2023-07-28 | Discharge: 2023-07-28 | Disposition: A | Discharge status: Home / Self Care | Source: Ambulatory Visit | Attending: Physician Assistant | Admitting: Physician Assistant

## 2023-07-28 DIAGNOSIS — E669 Obesity, unspecified: Secondary | ICD-10-CM | POA: Diagnosis present

## 2023-07-29 ENCOUNTER — Ambulatory Visit: Payer: Self-pay | Admitting: Physician Assistant

## 2023-07-29 NOTE — Telephone Encounter (Signed)
 Please see the message below and advised

## 2023-08-29 ENCOUNTER — Telehealth: Payer: Self-pay

## 2023-08-29 NOTE — Telephone Encounter (Signed)
 Patient had a thyroid  US  with her PCP and she was told she has a  Goiter and she wants to know if she needs to follow up with you.

## 2023-08-30 ENCOUNTER — Ambulatory Visit: Admitting: Physician Assistant

## 2023-08-30 ENCOUNTER — Encounter: Payer: Self-pay | Admitting: Physician Assistant

## 2023-08-30 VITALS — BP 88/53 | HR 76 | Resp 16 | Ht 68.0 in | Wt 252.0 lb

## 2023-08-30 DIAGNOSIS — D229 Melanocytic nevi, unspecified: Secondary | ICD-10-CM | POA: Diagnosis not present

## 2023-08-30 DIAGNOSIS — E669 Obesity, unspecified: Secondary | ICD-10-CM

## 2023-08-30 DIAGNOSIS — E063 Autoimmune thyroiditis: Secondary | ICD-10-CM

## 2023-08-30 NOTE — Progress Notes (Signed)
 Established patient visit  Patient: Brenda Villegas   DOB: April 25, 1990   33 y.o. Female  MRN: [id number] Visit Date: 08/30/2023  Today's healthcare provider: Jolynn Spencer, PA-C   Chief Complaint  Patient presents with   Follow-up    F/u on Chronic disease wt loss    Subjective     HPI     Follow-up    Additional comments: F/u on Chronic disease wt loss       Last edited by Marylen Odella CROME, CMA on 08/30/2023  1:58 PM.       Discussed the use of AI scribe software for clinical note transcription with the patient, who gave verbal consent to proceed.  History of Present Illness Delene Morais is a 33 year old female with hypothyroidism due to Hashimoto's thyroiditis who presents for follow-up on chronic conditions and medication management.  She has not started her prescribed thyroid  medication due to its high cost of $1700, despite having Medicaid. She has not received any notification from her insurance regarding coverage. She was informed that the medication should cost $4 at [facility], but she has been using [employer]. She occasionally feels like something is stuck in her throat and experiences a hoarse voice. She has not been taking medication for her thyroid  condition.  She is actively trying to lose weight by walking two miles four times a week and improving her diet. She has lost four pounds since her last visit, reducing her weight from 256 to 252 pounds.  She mentions a concern about a bump on her back that she does not remember being there before. She has a history of severe acne and has taken Accutane in the past. No pain, no issues with bowel movements, and occasional spots in her vision, which her eye doctor has assessed as normal.       08/30/2023    2:04 PM 07/19/2023    3:38 PM 06/09/2023    2:51 PM  Depression screen PHQ 2/9  Decreased Interest 0 0 0  Down, Depressed, Hopeless 0 0 0  PHQ - 2 Score 0 0 0  Altered  sleeping 1 0 1  Tired, decreased energy 0 0 1  Change in appetite 0 1 0  Feeling bad or failure about yourself  1 0 1  Trouble concentrating 0 0 0  Moving slowly or fidgety/restless 0 0 0  Suicidal thoughts 0 0 0  PHQ-9 Score 2 1 3   Difficult doing work/chores Not difficult at all  Not difficult at all      08/30/2023    2:04 PM 07/19/2023    3:39 PM 06/09/2023    2:51 PM 10/02/2020   10:48 AM  GAD 7 : Generalized Anxiety Score  Nervous, Anxious, on Edge 0 0 1 0  Control/stop worrying 0 0 1 0  Worry too much - different things 1 1 1 1   Trouble relaxing 1 1 0 0  Restless 0 0 0 0  Easily annoyed or irritable 1 1 1  0  Afraid - awful might happen 0 0 1 0  Total GAD 7 Score 3 3 5 1   Anxiety Difficulty Not difficult at all Not difficult at all Somewhat difficult Not difficult at all    Medications: Outpatient Medications Prior to Visit  Medication Sig   Multiple Vitamin (MULTIVITAMIN PO) Take by mouth.   Semaglutide -Weight Management 0.5 MG/0.5ML SOAJ Inject 0.5 mg into the skin once a week for 28 days. (Patient not taking:  Reported on 08/30/2023)   No facility-administered medications prior to visit.    Review of Systems All negative Except see HPI   {Insert previous labs (optional):23779} {See past labs  Heme  Chem  Endocrine  Serology  Results Review (optional):1}   Objective    BP (!) 88/53 (BP Location: Right Arm, Patient Position: Sitting, Cuff Size: Large)   Pulse 76   Resp 16   Ht 5' 8 (1.727 m)   Wt 252 lb (114.3 kg)   SpO2 98%   BMI 38.32 kg/m  {Insert last BP/Wt (optional):23777}{See vitals history (optional):1}   Physical Exam   No results found for any visits on 08/30/23.      Assessment and Plan Assessment & Plan Hypothyroidism due to Hashimoto's thyroiditis Chronic hypothyroidism secondary to Hashimoto's thyroiditis. Previous ultrasound indicated moderate heterogeneity consistent with chronic thyroiditis. Discussed potential side effects of  levothyroxine  and the necessity of thyroid  function monitoring. Emphasized starting with a low dose to minimize side effects. - Advise use of [facility] for cost-effective medication access. - Initiate levothyroxine  at 0.25 dosage. - Regularly monitor thyroid  function. - Educate on potential side effects, including hoarseness.  Weight management She has lost 4 pounds through walking and dietary improvements. Discussed Vigovia for weight management, emphasizing gradual weight loss to prevent skin sagging. Discussed potential side effects, including gastrointestinal symptoms, pancreatitis, and gallstones. Absence of gallbladder reduces gallstone risk. - Continue walking and healthy diet. - Consider initiating Vigovia at 0.25 dosage. - Monitor for gastrointestinal symptoms. - Educate on risks of pancreatitis and gallstones. - Report any abdominal pain immediately.  Change in mole Reports a change in a mole on the back. Discussed need for dermatological evaluation due to mole change. She is aware of the importance of monitoring skin changes. - Refer to [facility] for mole evaluation.  General Health Maintenance Reports overall good health. Discussed importance of monitoring health status changes and prompt reporting. - Advise monitoring health changes and reporting any issues.  Follow-up Discussed follow-up plan contingent on medication initiation. Emphasized importance of follow-up to monitor medication effects and adjust dosage as needed. - Schedule follow-up appointment 4-6 weeks after medication initiation. - Ensure notification upon medication initiation.    No orders of the defined types were placed in this encounter.   No follow-ups on file.   The patient was advised to call back or seek an in-person evaluation if the symptoms worsen or if the condition fails to improve as anticipated.  I discussed the assessment and treatment plan with the patient.  The patient was provided an opportunity to ask questions and all were answered. The patient agreed with the plan and demonstrated an understanding of the instructions.  I, Amirr Achord, PA-C have reviewed all documentation for this visit. The documentation on 08/30/2023  for the exam, diagnosis, procedures, and orders are all accurate and complete.  Jolynn Spencer, [facility], MMS [facility] [phone] (phone) [phone] (fax)  [facility]

## 2023-09-13 IMAGING — US US MFM OB DETAIL+14 WK
1 series · 13 of 28 positions shown · non-contrast
Comparison: none

[Series 1: us mfm ob detail+14 wk · 116 acquisitions, 13 frames shown]
[im 5/116]
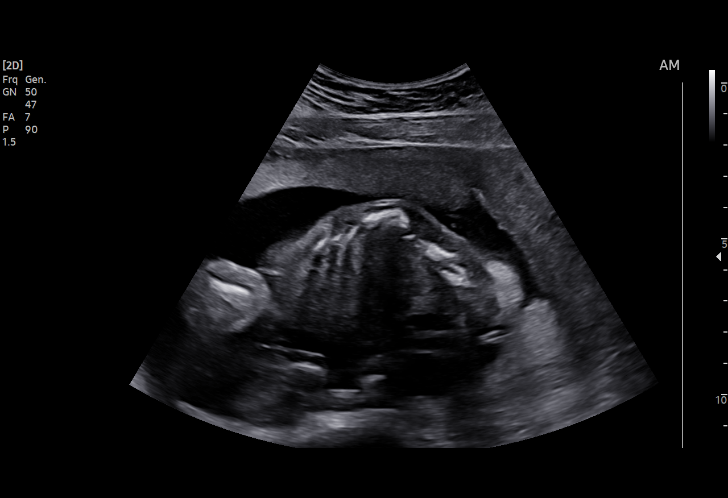
[im 13/116]
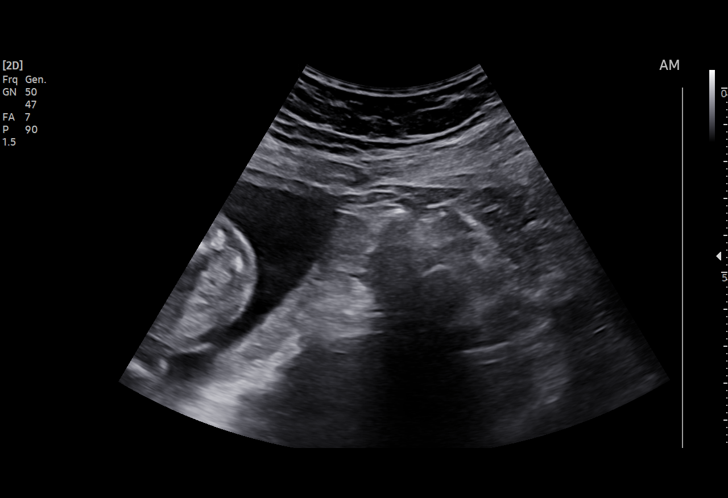
[im 22/116]
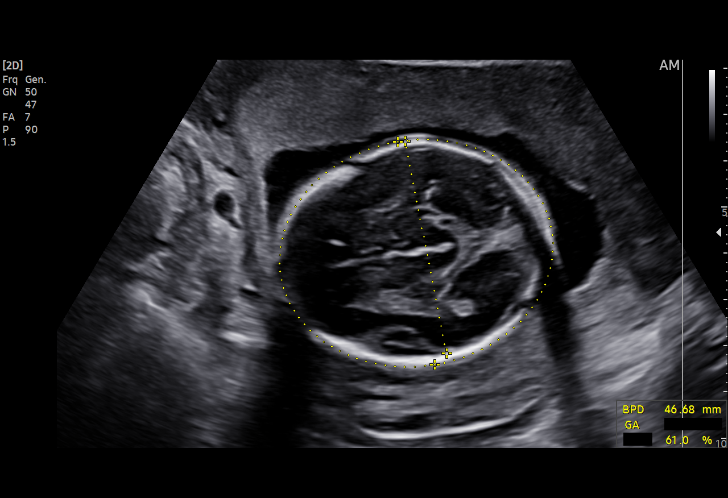
[im 30/116]
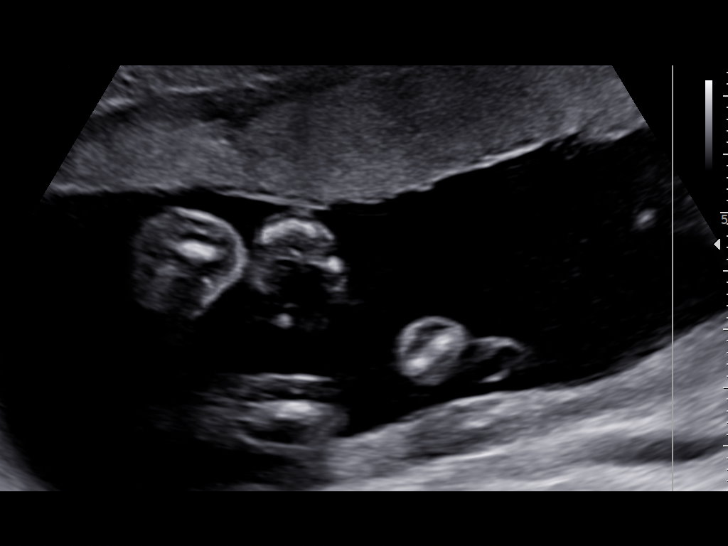
[im 39/116]
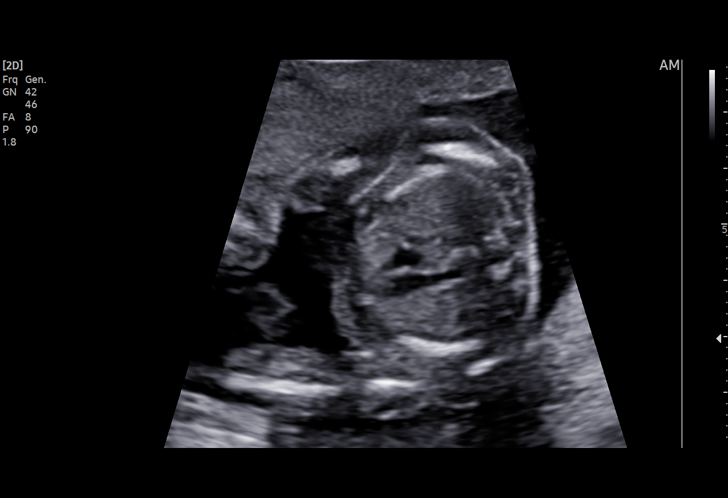
[im 47/116]
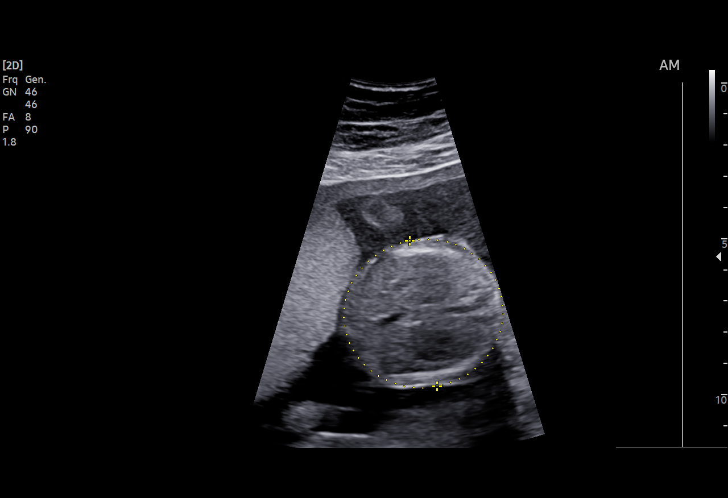
[im 60/116]
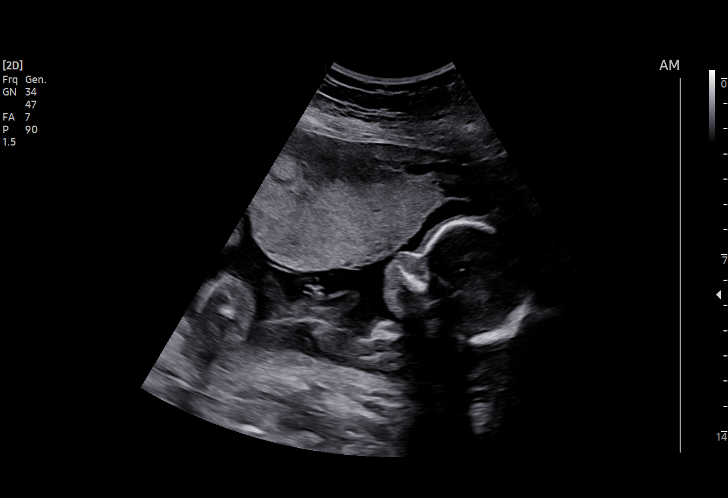
[im 69/116]
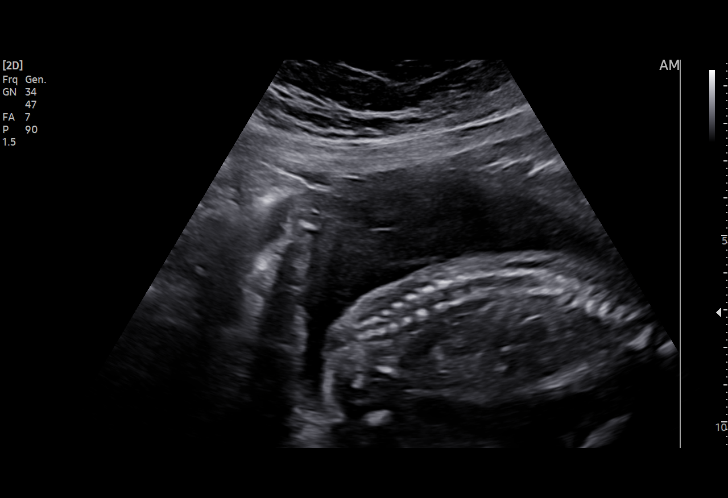
[im 77/116]
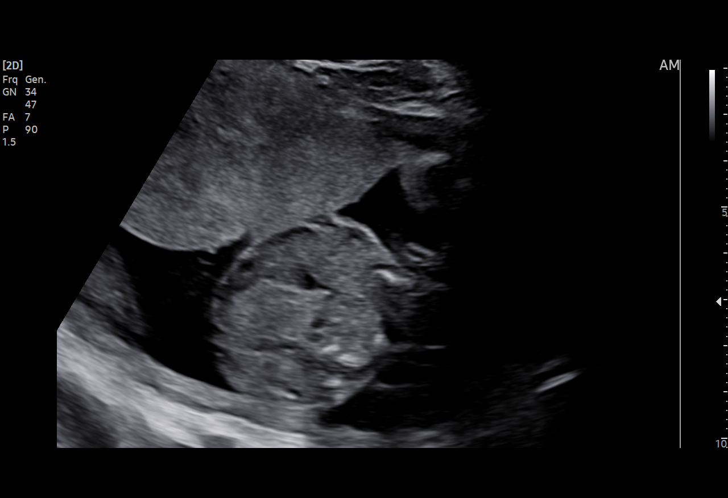
[im 86/116]
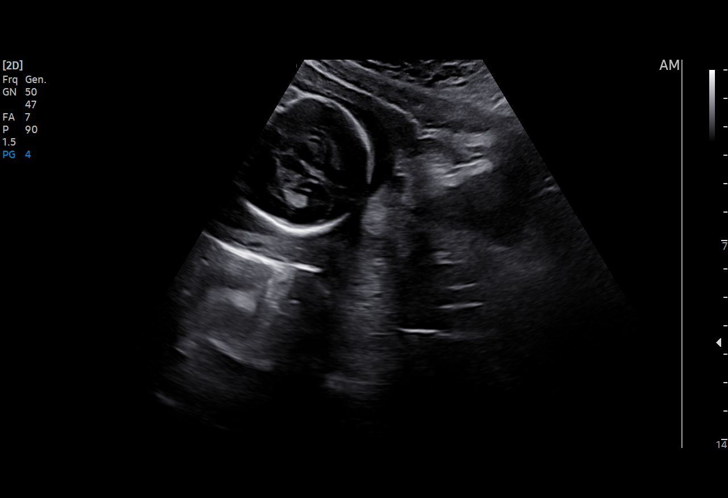
[im 94/116]
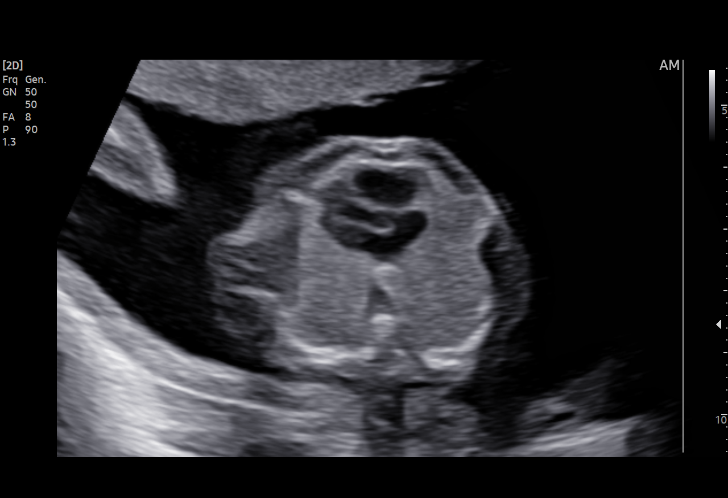
[im 103/116]
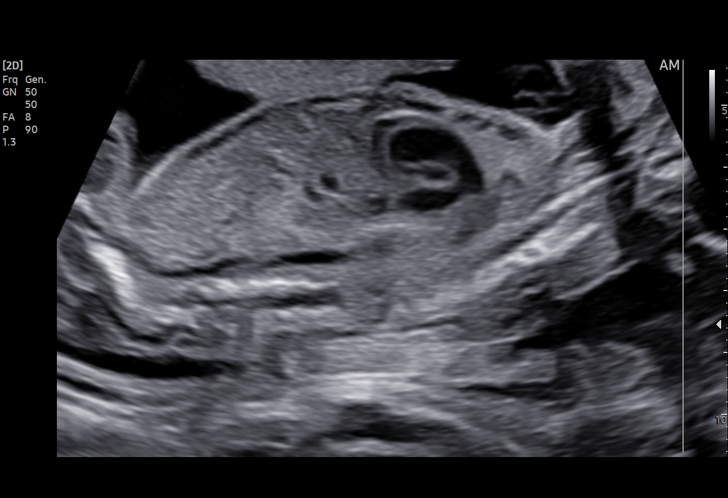
[im 111/116]
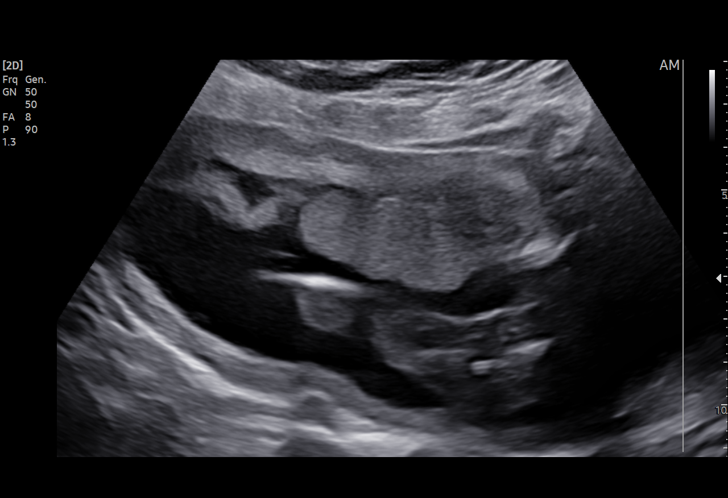

[13 of 28 positions shown; findings below may reference images not displayed]

Indications

 Obesity complicating pregnancy, second
 trimester (pre-g BMI: 36)
 Thyroid disease in pregnancy ([place]           BAE99.280,
 Disease)
 19 weeks gestation of pregnancy
 Antenatal screening for malformations
 LR-NIPS
Fetal Evaluation

 Num Of Fetuses:         1
 Fetal Heart Rate(bpm):  138
 Cardiac Activity:       Observed
 Presentation:           Cephalic
 Placenta:               Anterior
 P. Cord Insertion:      Visualized

 Amniotic Fluid
 AFI FV:      Within normal limits
Biometry

 BPD:     46.48  mm     G. Age:  20w 0d         58  %    CI:        74.97   %    70 - 86
                                                         FL/HC:      19.1   %    16.8 -
 HC:    170.31   mm     G. Age:  19w 4d         32  %    HC/AC:      1.14        1.09 -
 AC:      149.3  mm     G. Age:  20w 1d         56  %    FL/BPD:     70.1   %
 FL:      32.57  mm     G. Age:  20w 1d         54  %    FL/AC:      21.8   %    20 - 24
 CER:      20.4  mm     G. Age:  19w 4d         60  %
 NFT:       3.6  mm

 LV:        6.3  mm
 CM:        4.8  mm

 Est. FW:     333  gm    0 lb 12 oz      61  %
OB History

 Gravidity:    3         Term:   2        Prem:   0        SAB:   0
 TOP:          0       Ectopic:  0        Living: 2
Gestational Age

 LMP:           19w 6d        Date:  02/27/21                  EDD:   12/04/21
 U/S Today:     20w 0d                                        EDD:   12/03/21
 Best:          19w 6d     Det. By:  LMP  (02/27/21)          EDD:   12/04/21
Anatomy

 Cranium:               Appears normal         Aortic Arch:            Appears normal
 Cavum:                 Appears normal         Ductal Arch:            Appears normal
 Ventricles:            Appears normal         Diaphragm:              Appears normal
 Choroid Plexus:        Appears normal         Stomach:                Appears normal, left
                                                                       sided
 Cerebellum:            Appears normal         Abdomen:                Appears normal
 Posterior Fossa:       Appears normal         Abdominal Wall:         Appears nml (cord
                                                                       insert, abd wall)
 Nuchal Fold:           Appears normal         Cord Vessels:           Appears normal (3
                                                                       vessel cord)
 Face:                  Appears normal         Kidneys:                Appear normal
                        (orbits and profile)
 Lips:                  Appears normal         Bladder:                Appears normal
 Thoracic:              Appears normal         Spine:                  Appears normal
 Heart:                 Appears normal         Upper Extremities:      Appears normal
                        (4CH, axis, and
                        situs)
 RVOT:                  Appears normal         Lower Extremities:      Appears normal
 LVOT:                  Appears normal

 Other:  VC, 3VV and 3VTV visualized. Nasal bone, lenses, maxilla, mandible
         and falx visualized. Heels/feet and open hands/5th digits visualized.
         Fetus appears to be female.
Cervix Uterus Adnexa

 Cervix
 Length:            3.6  cm.

 Right Ovary
 Size(cm)     2.81   x   1.4    x  2.32      Vol(ml):
Comments
 This patient was seen for a detailed fetal anatomy scan due
 to maternal obesity and history of Graves' disease.  The
 patient reports that she is now hypothyroid and is currently
 taking levothyroxine 125 mcg daily.  Her most recent TSH
 and free T4 levels drawn 3 days ago were within normal limits.
 She denies any other significant past medical history and
 denies any problems in her current pregnancy.
 She had a cell free DNA test earlier in her pregnancy which
 indicated a low risk for trisomy 21, 18, and 13. A female fetus
 is predicted.
 She was informed that the fetal growth and amniotic fluid
 level were appropriate for her gestational age.
 There were no obvious fetal anomalies noted on today's
 ultrasound exam.
 The patient was informed that anomalies may be missed due
 to technical limitations. If the fetus is in a suboptimal position
 or maternal habitus is increased, visualization of the fetus in
 the maternal uterus may be impaired.
 Due to her history of thyroid disease, we will continue to
 follow her with growth ultrasounds.  She should have her
 thyroid function tests (TSH and free T4) monitored at least
 once every trimester.
 A follow-up growth scan was scheduled in 6 weeks.

## 2023-11-02 ENCOUNTER — Other Ambulatory Visit: Payer: Self-pay | Admitting: Orthopedic Surgery

## 2023-11-02 DIAGNOSIS — M2241 Chondromalacia patellae, right knee: Secondary | ICD-10-CM

## 2023-11-02 DIAGNOSIS — M25561 Pain in right knee: Secondary | ICD-10-CM

## 2023-11-02 DIAGNOSIS — G8929 Other chronic pain: Secondary | ICD-10-CM

## 2023-11-06 ENCOUNTER — Ambulatory Visit
Admission: RE | Admit: 2023-11-06 | Discharge: 2023-11-06 | Disposition: A | Source: Ambulatory Visit | Attending: Orthopedic Surgery | Admitting: Orthopedic Surgery

## 2023-11-06 DIAGNOSIS — G8929 Other chronic pain: Secondary | ICD-10-CM | POA: Insufficient documentation

## 2023-11-06 DIAGNOSIS — M2241 Chondromalacia patellae, right knee: Secondary | ICD-10-CM | POA: Diagnosis present

## 2023-11-06 DIAGNOSIS — M25561 Pain in right knee: Secondary | ICD-10-CM | POA: Insufficient documentation

## 2023-12-05 ENCOUNTER — Ambulatory Visit: Payer: Self-pay

## 2023-12-05 NOTE — Progress Notes (Signed)
 " Established patient visit  Patient: Brenda Villegas   DOB: 08/28/1990   33 y.o. Female  MRN: [id number] Visit Date: 12/06/2023  Today's healthcare provider: Luis Nickles, PA-C   Chief Complaint  Patient presents with   Acute Visit    Patient states that she is starting to have some anxiety/panic attacks here lately has been more frequently.  After she tends to have an attacks her left side of her cheek tingles which has her concerned.   Subjective     HPI     Acute Visit    Additional comments: Patient states that she is starting to have some anxiety/panic attacks here lately has been more frequently.  After she tends to have an attacks her left side of her cheek tingles which has her concerned.      Last edited by Toombs, Heather  L, CMA on 12/06/2023  9:43 AM.       Discussed the use of AI scribe software for clinical note transcription with the patient, who gave verbal consent to proceed.  History of Present Illness Brenda Villegas is a 33 year old female with Hashimoto's thyroiditis and anxiety who presents with numbness, tingling, and heaviness on the left side of her face  She experiences numbness, tingling, and a feeling of heaviness on the left side of her body. Her thyroid  antibodies have not been checked since her pregnancy, during which they were elevated. Her anxiety has increased over the past few weeks, particularly worsening before her menstrual period. Her period was a week late recently, and she experienced two and a half panic attacks in the past two weeks, occurring in the morning, at night, and once while driving. She has a history of vitamin D  deficiency, with levels dropping from 27 ng/mL eight months ago to 22.4 ng/mL five months ago, and has not been taking supplements regularly. She has not experienced migraines recently. A recent leg injury required her to reduce physical activity for a week, but she has since resumed walking. She has lost  approximately 20 pounds without using semaglutide .       12/06/2023    9:43 AM 08/30/2023    2:04 PM 07/19/2023    3:38 PM  Depression screen PHQ 2/9  Decreased Interest 0 0 0  Down, Depressed, Hopeless 0 0 0  PHQ - 2 Score 0 0 0  Altered sleeping 2 1 0  Tired, decreased energy 1 0 0  Change in appetite 0 0 1  Feeling bad or failure about yourself  2 1 0  Trouble concentrating 0 0 0  Moving slowly or fidgety/restless 0 0 0  Suicidal thoughts 0 0 0  PHQ-9 Score 5 2 1   Difficult doing work/chores Not difficult at all Not difficult at all       12/06/2023    9:44 AM 08/30/2023    2:04 PM 07/19/2023    3:39 PM 06/09/2023    2:51 PM  GAD 7 : Generalized Anxiety Score  Nervous, Anxious, on Edge 1 0 0 1  Control/stop worrying 1 0 0 1  Worry too much - different things 1 1 1 1   Trouble relaxing 1 1 1  0  Restless 0 0 0 0  Easily annoyed or irritable 1 1 1 1   Afraid - awful might happen 1 0 0 1  Total GAD 7 Score 6 3 3 5   Anxiety Difficulty Somewhat difficult Not difficult at all Not difficult at all Somewhat difficult    Medications: Outpatient  Medications Prior to Visit  Medication Sig   Multiple Vitamin (MULTIVITAMIN PO) Take by mouth.   No facility-administered medications prior to visit.    Review of Systems  All other systems reviewed and are negative.  All negative Except see HPI       Objective    BP 110/67 (BP Location: Right Arm, Patient Position: Sitting, Cuff Size: Normal)   Pulse 70   Temp 98.9 F (37.2 C) (Oral)   Ht 5' 8 (1.727 m)   Wt 239 lb 3.2 oz (108.5 kg)   SpO2 100%   BMI 36.37 kg/m     Physical Exam Vitals reviewed.  Constitutional:      General: She is not in acute distress.    Appearance: Normal appearance. She is well-developed. She is not diaphoretic.  HENT:     Head: Normocephalic and atraumatic.  Eyes:     General: No scleral icterus.    Conjunctiva/sclera: Conjunctivae normal.  Neck:     Thyroid : No thyromegaly.   Cardiovascular:     Rate and Rhythm: Normal rate and regular rhythm.     Pulses: Normal pulses.     Heart sounds: Normal heart sounds. No murmur heard. Pulmonary:     Effort: Pulmonary effort is normal. No respiratory distress.     Breath sounds: Normal breath sounds. No wheezing, rhonchi or rales.  Musculoskeletal:     Cervical back: Neck supple.     Right lower leg: No edema.     Left lower leg: No edema.  Lymphadenopathy:     Cervical: No cervical adenopathy.  Skin:    General: Skin is warm and dry.     Findings: No rash.  Neurological:     General: No focal deficit present.     Mental Status: She is alert and oriented to person, place, and time. Mental status is at baseline.     Cranial Nerves: No cranial nerve deficit.     Sensory: No sensory deficit.     Motor: No weakness.     Coordination: Coordination normal.     Gait: Gait normal.     Deep Tendon Reflexes: Reflexes normal.  Psychiatric:        Mood and Affect: Mood normal.        Behavior: Behavior normal.      No results found for any visits on 12/06/23.       Assessment & Plan Panic Attacks and Anxiety Experiencing panic attacks and increased anxiety, particularly premenstrually. Therapy recommended as primary treatment, with potential SSRI use if needed. Open to therapy and will be contacted by clinic therapist. - Refer to clinic therapist for counseling and potential follow-up with psychiatry if needed. - Discuss potential use of SSRIs with therapist if anxiety and panic attacks persist.  Hashimoto's Thyroiditis Rechecking thyroid  function and antibodies necessary to assess current status and contribution to symptoms. - Order thyroid  function tests and thyroid  antibody tests.  Vitamin D  Deficiency Vitamin D  levels decreased. Not taking recommended supplements. Deficiency may exacerbate symptoms such as fatigue, anxiety, and panic attacks. - Recheck vitamin D  levels. - Advise regular vitamin D   supplementation.  Migraines No recent migraines reported.  General Health Maintenance Encouraged to maintain a healthy lifestyle, including regular exercise, despite recent leg injury. Acknowledged recent weight loss. - Encourage regular exercise and healthy lifestyle habits.  Follow-up Follow-up in approximately three months to reassess condition and review lab results. - Schedule follow-up appointment in three months. - Complete lab work at [facility]  or other specified locations.   Numbness and tingling of left side of face (Primary) Could be associated with panic attacks, hyperventilation syndrome during the panic attacks,  Consider therapy and SSRIs for management of panic attacks Workup ordered - TSH - T4, free - Lipid panel - Comprehensive metabolic panel with GFR - CBC with Differential/Platelet - Anti-TPO Ab (RDL) - VITAMIN D  25 Hydroxy (Vit-D Deficiency, Fractures) - ANA Direct w/Reflex if Positive Will reassess after  receiving lab results Will FU  Anxiety  - Amb ref to Integrated Behavioral Health Collaboration of Care: Medication Management AEB  , Primary Care Provider AEB  , Psychiatrist AEB  , and Referral or follow-up with counselor/therapist AEB    Patient/Guardian was advised Release of Information must be obtained prior to any record release in order to collaborate their care with an outside provider. Patient/Guardian was advised if they have not already done so to contact the registration department to sign all necessary forms in order for us  to release information regarding their care.   Consent: Patient/Guardian gives verbal consent for treatment and assignment of benefits for services provided during this visit. Patient/Guardian expressed understanding and agreed to proceed.   Hypothyroidism due to Hashimoto thyroiditis Tsh, t4, tpo antibodies  Obesity Chronic and improving Weight loss of ~20 Ib noted since 07/19/23 Pt declined wegovy  due to her thyroid   problems Continue lifestyle modifications including diet and exercise Will follow-up Orders Placed This Encounter  Procedures   TSH   T4, free   Lipid panel    Has the patient fasted?:   Yes   Comprehensive metabolic panel with GFR    Has the patient fasted?:   Yes   CBC with Differential/Platelet   Anti-TPO Ab (RDL)   VITAMIN D  25 Hydroxy (Vit-D Deficiency, Fractures)   ANA Direct w/Reflex if Positive   Amb ref to Integrated Behavioral Health    Referral Priority:   Routine    Referral Type:   Consultation    Referral Reason:   Specialty Services Required    Number of Visits Requested:   1    Return in about 3 months (around 03/07/2024) for chronic disease f/u.   The patient was advised to call back or seek an in-person evaluation if the symptoms worsen or if the condition fails to improve as anticipated.  I discussed the assessment and treatment plan with the patient. The patient was provided an opportunity to ask questions and all were answered. The patient agreed with the plan and demonstrated an understanding of the instructions.  I, Shaneque Merkle, PA-C have reviewed all documentation for this visit. The documentation on 12/06/2023  for the exam, diagnosis, procedures, and orders are all accurate and complete.  Jolynn Spencer, [facility], MMS [facility] [phone] (phone) [phone] (fax)  [facility] "

## 2023-12-05 NOTE — Telephone Encounter (Signed)
 FYI Only or Action Required?: FYI only for provider.  Patient was last seen in primary care on 08/30/2023 by Ostwalt, Janna, PA-C.  Called Nurse Triage reporting Face Numbness.  Symptoms began several days ago.  Interventions attempted: Nothing.  Symptoms are: stable.  Triage Disposition: See PCP When Office is Open (Within 3 Days)  Patient/caregiver understands and will follow disposition?: Yes Reason for Disposition  [1] Numbness or tingling on both sides of body AND [2] is a new symptom present > 24 hours  Answer Assessment - Initial Assessment Questions States friend was an EMT and came to check vitals and everything was normal, no drooping and ruled out stroke. Patient has hashimotos and anxiety. Patient states it is triggering her anxiety more, states anxiety / panic attacks are new so unsure if that's causing it. States she when she gets migraines she can sometimes think its a tumor or something more serious is wrong.   1. SYMPTOM: What is the main symptom you are concerned about? (e.g., weakness, numbness)     Numbness and tingling on left cheek, feels heavy  2. ONSET: When did this start? (e.g., minutes, hours, days; while sleeping)     Friday  3. LAST NORMAL: When was the last time you (the patient) were normal (no symptoms)?     Thursday  4. PATTERN Does this come and go, or has it been constant since it started?  Is it present now?     Constant, but severity comes and goes  5. CARDIAC SYMPTOMS: Have you had any of the following symptoms: chest pain, difficulty breathing, palpitations?     Denies  6. NEUROLOGIC SYMPTOMS: Have you had any of the following symptoms: headache, dizziness, vision loss, double vision, changes in speech, unsteady on your feet?     Denies  7. OTHER SYMPTOMS: Do you have any other symptoms?     Nausea, possibly contributed to anxiety, been there since panic attack on Saturday.  Protocols used: Neurologic  [facility]  Copied from CRM [id number]. Topic: Clinical - Red Word Triage >> Dec 05, 2023 10:59 AM Everette C wrote: [facility] that prompted transfer to Nurse Triage: The patient has noticed tingling and numbness on the left side of their face. The patient has experienced concerns since 12/02/23

## 2023-12-06 ENCOUNTER — Ambulatory Visit: Admitting: Physician Assistant

## 2023-12-06 ENCOUNTER — Encounter: Payer: Self-pay | Admitting: Physician Assistant

## 2023-12-06 VITALS — BP 110/67 | HR 70 | Temp 98.9°F | Ht 68.0 in | Wt 239.2 lb

## 2023-12-06 DIAGNOSIS — R200 Anesthesia of skin: Secondary | ICD-10-CM | POA: Diagnosis not present

## 2023-12-06 DIAGNOSIS — F419 Anxiety disorder, unspecified: Secondary | ICD-10-CM | POA: Diagnosis not present

## 2023-12-06 DIAGNOSIS — E063 Autoimmune thyroiditis: Secondary | ICD-10-CM | POA: Diagnosis not present

## 2023-12-06 DIAGNOSIS — R202 Paresthesia of skin: Secondary | ICD-10-CM

## 2023-12-06 DIAGNOSIS — E669 Obesity, unspecified: Secondary | ICD-10-CM

## 2023-12-06 DIAGNOSIS — E559 Vitamin D deficiency, unspecified: Secondary | ICD-10-CM

## 2023-12-11 LAB — CBC WITH DIFFERENTIAL/PLATELET
Basophils Absolute: 0.1 x10E3/uL (ref 0.0–0.2)
Basos: 1 %
EOS (ABSOLUTE): 0.2 x10E3/uL (ref 0.0–0.4)
Eos: 3 %
Hematocrit: 43.1 % (ref 34.0–46.6)
Hemoglobin: 13.8 g/dL (ref 11.1–15.9)
Immature Grans (Abs): 0 x10E3/uL (ref 0.0–0.1)
Immature Granulocytes: 0 %
Lymphocytes Absolute: 2.2 x10E3/uL (ref 0.7–3.1)
Lymphs: 30 %
MCH: 28.1 pg (ref 26.6–33.0)
MCHC: 32 g/dL (ref 31.5–35.7)
MCV: 88 fL (ref 79–97)
Monocytes Absolute: 0.4 x10E3/uL (ref 0.1–0.9)
Monocytes: 5 %
Neutrophils Absolute: 4.4 x10E3/uL (ref 1.4–7.0)
Neutrophils: 61 %
Platelets: 244 x10E3/uL (ref 150–450)
RBC: 4.91 x10E6/uL (ref 3.77–5.28)
RDW: 12.7 % (ref 11.7–15.4)
WBC: 7.3 x10E3/uL (ref 3.4–10.8)

## 2023-12-11 LAB — T4, FREE: Free T4: 1.27 ng/dL (ref 0.82–1.77)

## 2023-12-11 LAB — COMPREHENSIVE METABOLIC PANEL WITH GFR
ALT: 14 IU/L (ref 0–32)
AST: 15 IU/L (ref 0–40)
Albumin: 4.4 g/dL (ref 3.9–4.9)
Alkaline Phosphatase: 128 IU/L — ABNORMAL HIGH (ref 41–116)
BUN/Creatinine Ratio: 11 (ref 9–23)
BUN: 11 mg/dL (ref 6–20)
Bilirubin Total: 0.6 mg/dL (ref 0.0–1.2)
CO2: 23 mmol/L (ref 20–29)
Calcium: 10.2 mg/dL (ref 8.7–10.2)
Chloride: 103 mmol/L (ref 96–106)
Creatinine, Ser: 1.03 mg/dL — ABNORMAL HIGH (ref 0.57–1.00)
Globulin, Total: 3.1 g/dL (ref 1.5–4.5)
Glucose: 88 mg/dL (ref 70–99)
Potassium: 4.9 mmol/L (ref 3.5–5.2)
Sodium: 138 mmol/L (ref 134–144)
Total Protein: 7.5 g/dL (ref 6.0–8.5)
eGFR: 74 mL/min/1.73 (ref >59)

## 2023-12-11 LAB — LIPID PANEL
Chol/HDL Ratio: 3.8 ratio (ref 0.0–4.4)
Cholesterol, Total: 169 mg/dL (ref 100–199)
HDL: 45 mg/dL (ref >39)
LDL Chol Calc (NIH): 108 mg/dL — ABNORMAL HIGH (ref 0–99)
Triglycerides: 83 mg/dL (ref 0–149)
VLDL Cholesterol Cal: 16 mg/dL (ref 5–40)

## 2023-12-11 LAB — ANA W/REFLEX IF POSITIVE: Anti Nuclear Antibody (ANA): NEGATIVE

## 2023-12-11 LAB — ANTI-TPO AB (RDL): Anti-TPO Ab (RDL): 169.8 [IU]/mL — ABNORMAL HIGH (ref <9.0)

## 2023-12-11 LAB — TSH: TSH: 1.45 u[IU]/mL (ref 0.450–4.500)

## 2023-12-11 LAB — VITAMIN D 25 HYDROXY (VIT D DEFICIENCY, FRACTURES): Vit D, 25-Hydroxy: 32.4 ng/mL (ref 30.0–100.0)

## 2023-12-13 ENCOUNTER — Ambulatory Visit: Payer: Self-pay | Admitting: Physician Assistant

## 2024-01-12 ENCOUNTER — Other Ambulatory Visit (HOSPITAL_COMMUNITY): Payer: Self-pay

## 2024-01-13 ENCOUNTER — Telehealth: Payer: Self-pay

## 2024-01-13 ENCOUNTER — Telehealth: Payer: Self-pay | Admitting: Pharmacy Technician

## 2024-01-13 ENCOUNTER — Encounter: Admitting: Student

## 2024-01-13 NOTE — Telephone Encounter (Signed)
 Copied from CRM [id number]. Topic: Appointments - Transfer of Care >> Jan 13, 2024  2:24 PM Rachelle R wrote: Pt is requesting to transfer FROM: Brenda Villegas Pt is requesting to transfer TO: [name] Reason for requested transfer: Personal Preference It is the responsibility of the team the patient would like to transfer to (Dr. [name]) to reach out to the patient if for any reason this transfer is not acceptable.

## 2024-01-13 NOTE — Telephone Encounter (Signed)
 Pharmacy Patient Advocate Encounter   Received notification from Onbase that prior authorization for Wegovy  0.25MG /0.5ML auto-injectors  is due for renewal.   Insurance verification completed.   The patient is insured through [facility]  Action: Medication has been discontinued. Archived Key: [id number]

## 2024-01-16 ENCOUNTER — Institutional Professional Consult (permissible substitution): Admitting: Licensed Clinical Social Worker

## 2024-01-20 ENCOUNTER — Ambulatory Visit: Admitting: Student

## 2024-01-20 ENCOUNTER — Encounter: Payer: Self-pay | Admitting: Student

## 2024-01-20 VITALS — BP 110/80 | HR 94 | Ht 68.0 in | Wt 240.0 lb

## 2024-01-20 DIAGNOSIS — Z8639 Personal history of other endocrine, nutritional and metabolic disease: Secondary | ICD-10-CM | POA: Diagnosis not present

## 2024-01-20 DIAGNOSIS — E785 Hyperlipidemia, unspecified: Secondary | ICD-10-CM | POA: Insufficient documentation

## 2024-01-20 DIAGNOSIS — F419 Anxiety disorder, unspecified: Secondary | ICD-10-CM | POA: Diagnosis not present

## 2024-01-20 DIAGNOSIS — E559 Vitamin D deficiency, unspecified: Secondary | ICD-10-CM | POA: Diagnosis not present

## 2024-01-20 NOTE — Progress Notes (Signed)
 New Patient Office Visit  Subjective    Patient ID: Brenda Villegas, female    DOB: 23-Apr-1990  Age: 33 y.o. MRN: [id number]  CC:  Chief Complaint  Patient presents with   New Patient (Initial Visit)    Establish care with provider    HPI Brenda Villegas is a 33 y.o. person living with anxiety and hashimoto thyroiditis presents to establish care.  Currently taking multivitamin, herbal supplement including Ashwagandha, fish oil, calcium, and soy extract.   Outpatient Encounter Medications as of 01/20/2024  Medication Sig   Multiple Vitamin (MULTIVITAMIN PO) Take by mouth.   [DISCONTINUED] fluticasone  (FLONASE ) 50 MCG/ACT nasal spray Place 1 spray into both nostrils daily.   No facility-administered encounter medications on file as of 01/20/2024.    Past Medical History:  Diagnosis Date   Allergy    Anemia    GERD (gastroesophageal reflux disease)    h/o with preganancy only   History of migraine    HPV in female    Hyperthyroidism 08/07/2014   Supervision of high risk pregnancy, antepartum 06/28/2019   Clinic [facility] Prenatal Labs  Dating 6 week ultrasound Blood type: O/Negative/-- (05/20 1121)   Genetic Screen Declines AFP: [ ]  Antibody:Negative (10/27 1531) positive on 1/14- weak Anti D antibody noted.  Anatomic US  Normal female Rubella: 3.58 (05/20 1121)  Varicella: Immune  GTT   Third 103 trimester:  RPR: Non Reactive (10/27 1531)   Rhogam [x ] 28 weeks. Given 11/23 HBsAg: Negative (05/20 112    Past Surgical History:  Procedure Laterality Date   CHOLECYSTECTOMY  04/2022   KNEE SURGERY Right 02/09/2004   LAPAROSCOPIC TUBAL LIGATION Bilateral 03/01/2022   Procedure: LAPAROSCOPIC FILSHIE CLIP PLACEMENT FOR PERMANENT STERILIZATION;  Surgeon: Janit Alm Agent, MD;  Location: [facility] ORS;  Service: Gynecology;  Laterality: Bilateral;  [id number]   TONSILLECTOMY     At age 33   TUBAL LIGATION     WISDOM TOOTH EXTRACTION     all four    Family History  Problem  Relation Age of Onset   Hyperlipidemia Mother    Hypertension Mother    Ovarian cysts Mother        removed cyst on one side in 2018   Thyroid  disease Mother    Bipolar disorder Father    Thyroid  cancer Maternal Aunt    Breast cancer Paternal Aunt        ? age, most likely over 22   Thyroid  disease Maternal Grandmother    Cancer Maternal Grandfather    Esophageal cancer Maternal Grandfather    Breast cancer Other    Pancreatic cancer Other        late years   Asthma Neg Hx    Heart disease Neg Hx    Diabetes Neg Hx    Stroke Neg Hx     Social History   Socioeconomic History   Marital status: Married    Spouse name: Josh   Number of children: Not on file   Years of education: Not on file   Highest education level: Some college, no degree  Occupational History   Not on file  Tobacco Use   Smoking status: Former    Types: E-cigarettes    Quit date: 03/24/2021    Years since quitting: 2.8   Smokeless tobacco: Never  Vaping Use   Vaping status: Former  Substance and Sexual Activity   Alcohol use: No    Alcohol/week: 0.0 standard drinks of alcohol  Drug use: No   Sexual activity: Yes    Partners: Male    Birth control/protection: Surgical    Comment: BTL 02/2022  Other Topics Concern   Not on file  Social History Narrative   Not on file   Social Drivers of Health   Tobacco Use: Medium Risk (01/20/2024)   Patient History    Smoking Tobacco Use: Former    Smokeless Tobacco Use: Never    Passive Exposure: Not on file  Financial Resource Strain: Low Risk  (11/22/2023)   Received from [facility]   Overall Financial Resource Strain (CARDIA)    Difficulty of Paying Living Expenses: Not hard at all  Food Insecurity: No Food Insecurity (11/22/2023)   Received from [facility]   Epic    Within the past 12 months, you worried that your food would run out before you got the money to buy more.: Never true    Within the past 12  months, the food you bought just didn't last and you didn't have money to get more.: Never true  Transportation Needs: No Transportation Needs (11/22/2023)   Received from [facility] - Transportation    In the past 12 months, has lack of transportation kept you from medical appointments or from getting medications?: No    Lack of Transportation (Non-Medical): No  Physical Activity: Insufficiently Active (06/08/2023)   Exercise Vital Sign    Days of Exercise per Week: 3 days    Minutes of Exercise per Session: 30 min  Stress: No Stress Concern Present (06/08/2023)   [employer] of Occupational Health - Occupational Stress Questionnaire    Feeling of Stress : Only a little  Social Connections: Moderately Integrated (06/08/2023)   Social Connection and Isolation Panel    Frequency of Communication with Friends and Family: More than three times a week    Frequency of Social Gatherings with Friends and Family: Once a week    Attends Religious Services: 1 to 4 times per year    Active Member of [employer] or Organizations: No    Attends Engineer, Structural: Not on file    Marital Status: Married  Catering Manager Violence: Not At Risk (04/28/2022)   Humiliation, Afraid, Rape, and Kick questionnaire    Fear of Current or Ex-Partner: No    Emotionally Abused: No    Physically Abused: No    Sexually Abused: No  Depression (PHQ2-9): Low Risk (01/20/2024)   Depression (PHQ2-9)    PHQ-2 Score: 4  Recent Concern: Depression (PHQ2-9) - Medium Risk (12/06/2023)   Depression (PHQ2-9)    PHQ-2 Score: 5  Alcohol Screen: Low Risk (06/08/2023)   Alcohol Screen    Last Alcohol Screening Score (AUDIT): 1  Housing: Low Risk  (11/22/2023)   Received from [facility]   Epic    In the last 12 months, was there a time when you were not able to pay the mortgage or rent on time?: No    In the past 12 months, how many times have you moved where you were  living?: 0    At any time in the past 12 months, were you homeless or living in a shelter (including now)?: No  Utilities: Not At Risk (11/22/2023)   Received from [facility]   Epic    In the past 12 months has the electric, gas, oil, or water company threatened to shut off services in your  home?: No  Health Literacy: Not on file    ROS Refer to HPI    Objective   BP 110/80   Pulse 94   Ht 5' 8 (1.727 m)   Wt 240 lb (108.9 kg)   LMP 12/26/2023   SpO2 99%   BMI 36.49 kg/m   Physical Exam Constitutional:      Appearance: Normal appearance.  HENT:     Mouth/Throat:     Mouth: Mucous membranes are moist.     Pharynx: Oropharynx is clear.  Neck:     Comments: No thyromegaly  Cardiovascular:     Rate and Rhythm: Normal rate and regular rhythm.  Pulmonary:     Effort: Pulmonary effort is normal.     Breath sounds: No rhonchi or rales.  Abdominal:     General: Abdomen is flat. Bowel sounds are normal. There is no distension.     Palpations: Abdomen is soft.     Tenderness: There is no abdominal tenderness.  Musculoskeletal:        General: Normal range of motion.     Right lower leg: No edema.     Left lower leg: No edema.  Skin:    General: Skin is warm and dry.     Capillary Refill: Capillary refill takes less than 2 seconds.  Neurological:     General: No focal deficit present.     Mental Status: She is alert and oriented to person, place, and time.  Psychiatric:        Mood and Affect: Mood normal.        Behavior: Behavior normal.        01/20/2024   10:51 AM 12/06/2023    9:43 AM 08/30/2023    2:04 PM  Depression screen PHQ 2/9  Decreased Interest 0 0 0  Down, Depressed, Hopeless 0 0 0  PHQ - 2 Score 0 0 0  Altered sleeping 2 2 1   Tired, decreased energy 1 1 0  Change in appetite 0 0 0  Feeling bad or failure about yourself  1 2 1   Trouble concentrating 0 0 0  Moving slowly or fidgety/restless 0 0 0  Suicidal thoughts 0 0 0   PHQ-9 Score 4 5  2    Difficult doing work/chores Not difficult at all Not difficult at all Not difficult at all     Data saved with a previous flowsheet row definition      01/20/2024   10:51 AM 12/06/2023    9:44 AM 08/30/2023    2:04 PM 07/19/2023    3:39 PM  GAD 7 : Generalized Anxiety Score  Nervous, Anxious, on Edge 1 1 0 0  Control/stop worrying 0 1 0 0  Worry too much - different things 1 1 1 1   Trouble relaxing 2 1 1 1   Restless 0 0 0 0  Easily annoyed or irritable 1 1 1 1   Afraid - awful might happen 1 1 0 0  Total GAD 7 Score 6 6 3 3   Anxiety Difficulty Not difficult at all Somewhat difficult Not difficult at all Not difficult at all    Last CBC Lab Results  Component Value Date   WBC 7.3 12/06/2023   HGB 13.8 12/06/2023   HCT 43.1 12/06/2023   MCV 88 12/06/2023   MCH 28.1 12/06/2023   RDW 12.7 12/06/2023   PLT 244 12/06/2023        Assessment & Plan:  History of Hashimoto thyroiditis Assessment & Plan:  Seeing endocrinologist in [place] with Dr. Sam. Previously on levothyroxine  during pregnancy but stopped due to normalization of thyroid  function. Anti TPO elevated at 169.8 with normal TSH 1.450 and free T4 1.27 when check in 10/28. Not have sx of over hypothyroidism today. Continue follow up with endocrinology.   Anxiety Assessment & Plan: Reports history of anxiety and panic attacks, feels overwhelmed  with day to day tasks and about 2 days prior to her period. Also feels large groups triggers her anxiety. Taking ashwaganda and feels this helps. Has appointment with intergrative behavior health through [facility] on 1/5, will see if she can be seen now that she is establishing here today. She is very axious about stating medications. Suspect she may have component of PMDD and discussed fluoxetine prior to cylces, but she is hesitant about this. Also discussed Calm Aid/Silexan as she may be more comfortable with this. She will consider this. She will follow  up is anxiety worsens.   Vitamin D  deficiency Assessment & Plan: Vitamin D  32.4  on 12/06/2023, continue with supplementation.      Return in about 6 months (around 07/20/2024) for physical.   [name], MD

## 2024-01-20 NOTE — Assessment & Plan Note (Addendum)
 Seeing endocrinologist in [place] with Dr. Sam. Previously on levothyroxine  during pregnancy but stopped due to normalization of thyroid  function. Anti TPO elevated at 169.8 with normal TSH 1.450 and free T4 1.27 when check in 10/28. Not have sx of over hypothyroidism today. Continue follow up with endocrinology.

## 2024-01-20 NOTE — Progress Notes (Deleted)
° °  Established Patient Office Visit  Subjective   Patient ID: Brenda Villegas, female    DOB: 1991/01/18  Age: 33 y.o. MRN: [id number]  Chief Complaint  Patient presents with   New Patient (Initial Visit)    Establish care with provider    Daisa Stennis Wire is a 33 y.o. person with medical hx listed below who presents today for ***  {History (Optional):23778}  ROS Refer to HPI    Objective:     Outpatient Encounter Medications as of 01/20/2024  Medication Sig   Multiple Vitamin (MULTIVITAMIN PO) Take by mouth.   [DISCONTINUED] fluticasone  (FLONASE ) 50 MCG/ACT nasal spray Place 1 spray into both nostrils daily.   No facility-administered encounter medications on file as of 01/20/2024.    Ht 5' 8 (1.727 m)   Wt 240 lb (108.9 kg)   LMP 12/26/2023   BMI 36.49 kg/m  BP Readings from Last 3 Encounters:  12/06/23 110/67  08/30/23 (!) 88/53  07/19/23 (!) 106/56    Physical Exam     01/20/2024   10:51 AM 12/06/2023    9:43 AM 08/30/2023    2:04 PM  Depression screen PHQ 2/9  Decreased Interest 0 0 0  Down, Depressed, Hopeless 0 0 0  PHQ - 2 Score 0 0 0  Altered sleeping 2 2 1   Tired, decreased energy 1 1 0  Change in appetite 0 0 0  Feeling bad or failure about yourself  1 2 1   Trouble concentrating 0 0 0  Moving slowly or fidgety/restless 0 0 0  Suicidal thoughts 0 0 0  PHQ-9 Score 4 5  2    Difficult doing work/chores Not difficult at all Not difficult at all Not difficult at all     Data saved with a previous flowsheet row definition       01/20/2024   10:51 AM 12/06/2023    9:44 AM 08/30/2023    2:04 PM 07/19/2023    3:39 PM  GAD 7 : Generalized Anxiety Score  Nervous, Anxious, on Edge 1 1 0 0  Control/stop worrying 0 1 0 0  Worry too much - different things 1 1 1 1   Trouble relaxing 2 1 1 1   Restless 0 0 0 0  Easily annoyed or irritable 1 1 1 1   Afraid - awful might happen 1 1 0 0  Total GAD 7 Score 6 6 3 3   Anxiety Difficulty Not  difficult at all Somewhat difficult Not difficult at all Not difficult at all    No results found for any visits on 01/20/24.  {Labs (Optional):23779}  The ASCVD Risk score (Arnett DK, et al., 2019) failed to calculate for the following reasons:   The 2019 ASCVD risk score is only valid for ages 10 to 57    Assessment & Plan:  There are no diagnoses linked to this encounter.   No follow-ups on file.    [name], MD

## 2024-01-20 NOTE — Assessment & Plan Note (Addendum)
 Reports history of anxiety and panic attacks, feels overwhelmed  with day to day tasks and about 2 days prior to her period. Also feels large groups triggers her anxiety. Taking ashwaganda and feels this helps. Has appointment with intergrative behavior health through [facility] on 1/5, will see if she can be seen now that she is establishing here today. She is very axious about stating medications. Suspect she may have component of PMDD and discussed fluoxetine prior to cylces, but she is hesitant about this. Also discussed Calm Aid/Silexan as she may be more comfortable with this. She will consider this. She will follow up is anxiety worsens.

## 2024-01-20 NOTE — Assessment & Plan Note (Signed)
 Vitamin D  32.4  on 12/06/2023, continue with supplementation.

## 2024-02-13 ENCOUNTER — Telehealth: Payer: Self-pay

## 2024-02-13 ENCOUNTER — Institutional Professional Consult (permissible substitution): Admitting: Licensed Clinical Social Worker

## 2024-02-13 NOTE — Telephone Encounter (Signed)
 Copied from CRM [id number]. Topic: Clinical - Medical Advice >> Feb 13, 2024  7:46 AM Brenda Villegas wrote: Reason for CRM: Pt called in to see about speaking to her primary care physician to see if needed to come in. Pt is not expereiecing any pain but is concerned.  Menstrual been very regular. last month did not have period at all. called OBGYN. if PCP can help and figure out whats going.  No symptoms. sometimes have the feeling of being off. am a mom of 3. other than that no pain. automatically took a pregnancy test and negative. family has history of issues down under. could be hormones out of wack.   would like to speak to PCP regarding the issue.   Please follow up with pt before end of day.    [phone]

## 2024-02-13 NOTE — Telephone Encounter (Signed)
 Called patient, missed period on 12/17, home pregnancy test negative x2, s/p tubal ligation. Not have other associated symptoms. Thinks she noticed blood tinged vaginal discharge this morning an feels may be having a period. Discussed having her monitor cycle and if continue to have irregularities for a few months or developing other symptoms to schedule visit with me or GYN office.

## 2024-03-02 ENCOUNTER — Ambulatory Visit: Payer: Self-pay

## 2024-03-02 ENCOUNTER — Encounter: Payer: Self-pay | Admitting: Student

## 2024-03-02 ENCOUNTER — Ambulatory Visit: Admitting: Student

## 2024-03-02 VITALS — BP 115/70 | HR 80 | Temp 98.0°F | Ht 68.0 in | Wt 232.1 lb

## 2024-03-02 DIAGNOSIS — F419 Anxiety disorder, unspecified: Secondary | ICD-10-CM

## 2024-03-02 MED ORDER — HYDROXYZINE HCL 10 MG PO TABS: 10.0000 mg | ORAL_TABLET | Freq: Three times a day (TID) | ORAL | 0 refills | Status: AC | PRN

## 2024-03-02 MED ORDER — SERTRALINE HCL 25 MG PO TABS: 25.0000 mg | ORAL_TABLET | Freq: Every day | ORAL | 3 refills | Status: AC

## 2024-03-02 NOTE — Telephone Encounter (Signed)
Noted  Pt has an appt  KP 

## 2024-03-02 NOTE — Telephone Encounter (Signed)
 FYI Only or Action Required?: FYI only for provider: appointment scheduled on 1/23.  Patient was last seen in primary care on 01/20/2024 by Lemon Raisin, MD.  Called Nurse Triage reporting Anxiety.  Symptoms began several days ago.  Interventions attempted: Rest, hydration, or home remedies.  Symptoms are: gradually worsening.  Triage Disposition: No disposition on file.  Patient/caregiver understands and will follow disposition?:   Reason for Triage: Patient states wince Wednesday has been having a lot of anxiety and panic attacks. Had 3 on Wednesday, 2 on Thursday, and last night had a lot of anxiety while trying to sleep.    Reason for Disposition  MODERATE anxiety (e.g., persistent or frequent anxiety symptoms; interferes with sleep, school, or work)  Answer Assessment - Initial Assessment Questions 1. CONCERN: Did anything happen that prompted you to call today?      no 2. ANXIETY SYMPTOMS: Can you describe how you (your loved one; patient) have been feeling? (e.g., tense, restless, panicky, anxious, keyed up, overwhelmed, sense of impending doom).      Spells on anxiety and difficulty sleepting 3. ONSET: How long have you been feeling this way? (e.g., hours, days, weeks)     Several days 4. SEVERITY: How would you rate the level of anxiety? (e.g., 0 - 10; or mild, moderate, severe).     moderate 5. FUNCTIONAL IMPAIRMENT: How have these feelings affected your ability to do daily activities? Have you had more difficulty than usual doing your normal daily activities? (e.g., getting better, same, worse; self-care, school, work, interactions)     Difficulty sleeping 6. HISTORY: Have you felt this way before? Have you ever been diagnosed with an anxiety problem in the past? (e.g., generalized anxiety disorder, panic attacks, PTSD). If Yes, ask: How was this problem treated? (e.g., medicines, counseling, etc.)     Yes general anxiety 7. RISK OF HARM - SUICIDAL  IDEATION: Do you ever have thoughts of hurting or killing yourself? If Yes, ask:  Do you have these feelings now? Do you have a plan on how you would do this?      8. TREATMENT:  What has been done so far to treat this anxiety? (e.g., medicines, relaxation strategies). What has helped?      9. THERAPIST: Do you have a counselor or therapist? If Yes, ask: What is their name?      10. POTENTIAL TRIGGERS: Do you drink caffeinated beverages (e.g., coffee, colas, teas), and how much daily? Do you drink alcohol or use any drugs? Have you started any new medicines recently?       no 11. PATIENT SUPPORT: Who is with you now? Who do you live with? Do you have family or friends who you can talk to?         68. OTHER SYMPTOMS: Do you have any other symptoms? (e.g., feeling depressed, trouble concentrating, trouble sleeping, trouble breathing, palpitations or fast heartbeat, chest pain, sweating, nausea, or diarrhea)       Mild sob at times  Protocols used: Anxiety and Panic Attack-A-AH

## 2024-03-02 NOTE — Progress Notes (Signed)
 "  Established Patient Office Visit  Subjective   Patient ID: Brenda Villegas, female    DOB: 03-01-1990  Age: 34 y.o. MRN: [id number]  Chief Complaint  Patient presents with   Anxiety    Patient reports have a 3 panic attacks on Wednesday, 2 on Thursdays, has been trying to control anxiety without medication but it here to consult trying to get on medication. SOB, tingling/numbness sensation going from her neck up to her forehead     Discussed the use of AI scribe software for clinical note transcription with the patient, who gave verbal consent to proceed.  History of Present Illness Brenda Villegas is a 34 year old female who presents with worsening panic attacks.  Over the past week her anxiety has worsened with increased panic attacks, including four episodes over two days. She feels on edge and notes loud noises, especially her child screaming, can trigger episodes.  During panic attacks she has a sudden whooshing sensation from chest to head, tingling from neck over her head, breathlessness, arm and grip weakness, and post-episode jaw and face pain. She sometimes hyperventilates. She denies chest pain.  She has ADHD and feels that anxiety and ADHD symptoms worsen each other. She is not on ADHD medication.  She uses box breathing, grounding exercises, and Herbalife RelaxNow with ashwagandha and passionflower, which helps partially but she still has breakthrough anxiety. She has difficulty falling asleep, especially at night when it is quiet, and uses background TV or herbal teas to help after panic attacks.  She denies thoughts of self-harm or harm to others.    Patient Active Problem List   Diagnosis Date Noted   HLD (hyperlipidemia) 01/20/2024   Allergic rhinitis due to allergen 06/12/2023   Obesity (BMI 30-39.9) 06/12/2023   Rosacea 06/12/2023   Anxiety 06/12/2023   Vitamin D  deficiency 06/09/2023   Labor and delivery, indication for care 11/30/2021   Pap  smear of cervix shows high risk HPV present 05/07/2021   Encounter for care or examination of lactating mother 02/22/2020   History of Hashimoto thyroiditis 06/28/2019      ROS Refer to HPI    Objective:     Outpatient Encounter Medications as of 03/02/2024  Medication Sig   hydrOXYzine  (ATARAX ) 10 MG tablet Take 1 tablet (10 mg total) by mouth 3 (three) times daily as needed.   Multiple Vitamin (MULTIVITAMIN PO) Take by mouth.   sertraline  (ZOLOFT ) 25 MG tablet Take 1 tablet (25 mg total) by mouth daily.   [DISCONTINUED] fluticasone  (FLONASE ) 50 MCG/ACT nasal spray Place 1 spray into both nostrils daily.   No facility-administered encounter medications on file as of 03/02/2024.    BP 115/70   Pulse 80   Temp 98 F (36.7 C) (Oral)   Ht 5' 8 (1.727 m)   Wt 232 lb 2 oz (105.3 kg)   SpO2 98%   BMI 35.29 kg/m  BP Readings from Last 3 Encounters:  03/02/24 115/70  01/20/24 110/80  12/06/23 110/67    Physical Exam Constitutional:      Appearance: Normal appearance.  HENT:     Mouth/Throat:     Mouth: Mucous membranes are moist.     Pharynx: Oropharynx is clear.  Cardiovascular:     Rate and Rhythm: Normal rate and regular rhythm.  Pulmonary:     Effort: Pulmonary effort is normal.     Breath sounds: No rhonchi or rales.  Abdominal:     General: Abdomen is flat. Bowel  sounds are normal. There is no distension.     Palpations: Abdomen is soft.     Tenderness: There is no abdominal tenderness.  Musculoskeletal:        General: Normal range of motion.     Right lower leg: No edema.     Left lower leg: No edema.  Skin:    General: Skin is warm and dry.     Capillary Refill: Capillary refill takes less than 2 seconds.  Neurological:     General: No focal deficit present.     Mental Status: She is alert and oriented to person, place, and time.  Psychiatric:        Mood and Affect: Mood normal.        Behavior: Behavior normal.        Thought Content: Thought  content normal.        Judgment: Judgment normal.        03/06/2024    1:51 PM 01/20/2024   10:51 AM 12/06/2023    9:43 AM  Depression screen PHQ 2/9  Decreased Interest 0 0 0  Down, Depressed, Hopeless 0 0 0  PHQ - 2 Score 0 0 0  Altered sleeping 3 2 2   Tired, decreased energy 2 1 1   Change in appetite 0 0 0  Feeling bad or failure about yourself  1 1 2   Trouble concentrating 1 0 0  Moving slowly or fidgety/restless 2 0 0  Suicidal thoughts 0 0 0  PHQ-9 Score 9 4 5    Difficult doing work/chores Somewhat difficult Not difficult at all Not difficult at all     Data saved with a previous flowsheet row definition       03/06/2024    1:51 PM 01/20/2024   10:51 AM 12/06/2023    9:44 AM 08/30/2023    2:04 PM  GAD 7 : Generalized Anxiety Score  Nervous, Anxious, on Edge 2 1  1   0   Control/stop worrying 2 0  1  0   Worry too much - different things 2 1  1  1    Trouble relaxing 2 2  1  1    Restless 2 0  0  0   Easily annoyed or irritable 2 1  1  1    Afraid - awful might happen 2 1  1   0   Total GAD 7 Score 14 6 6 3   Anxiety Difficulty Somewhat difficult Not difficult at all Somewhat difficult Not difficult at all     Data saved with a previous flowsheet row definition    No results found for any visits on 03/02/24.  Last CBC Lab Results  Component Value Date   WBC 7.3 12/06/2023   HGB 13.8 12/06/2023   HCT 43.1 12/06/2023   MCV 88 12/06/2023   MCH 28.1 12/06/2023   RDW 12.7 12/06/2023   PLT 244 12/06/2023   Last metabolic panel Lab Results  Component Value Date   GLUCOSE 88 12/06/2023   NA 138 12/06/2023   K 4.9 12/06/2023   CL 103 12/06/2023   CO2 23 12/06/2023   BUN 11 12/06/2023   CREATININE 1.03 (H) 12/06/2023   EGFR 74 12/06/2023   CALCIUM 10.2 12/06/2023   PROT 7.5 12/06/2023   ALBUMIN 4.4 12/06/2023   LABGLOB 3.1 12/06/2023   AGRATIO 1.6 06/28/2019   BILITOT 0.6 12/06/2023   ALKPHOS 128 (H) 12/06/2023   AST 15 12/06/2023   ALT 14 12/06/2023    ANIONGAP 10 04/27/2022  Last lipids Lab Results  Component Value Date   CHOL 169 12/06/2023   HDL 45 12/06/2023   LDLCALC 108 (H) 12/06/2023   TRIG 83 12/06/2023   CHOLHDL 3.8 12/06/2023   Last hemoglobin A1c Lab Results  Component Value Date   HGBA1C 5.4 06/10/2023      The ASCVD Risk score (Arnett DK, et al., 2019) failed to calculate for the following reasons:   The 2019 ASCVD risk score is only valid for ages 28 to 27    Assessment & Plan:  There are no diagnoses linked to this encounter.   Return in about 4 weeks (around 03/30/2024) for mood.    [name], MD "

## 2024-03-07 ENCOUNTER — Ambulatory Visit: Admitting: Physician Assistant

## 2024-03-12 NOTE — Assessment & Plan Note (Signed)
 Increased anxiety and panic attacks. Start zoloft  25 mg daily. Hydroxyzine  as needed. Encouraged establishing with therapy. Follow up 1 month.

## 2024-03-14 ENCOUNTER — Ambulatory Visit: Admitting: Internal Medicine

## 2024-03-14 ENCOUNTER — Other Ambulatory Visit

## 2024-03-14 ENCOUNTER — Encounter: Payer: Self-pay | Admitting: Internal Medicine

## 2024-03-14 VITALS — BP 118/80 | HR 112 | Ht 68.0 in | Wt 235.0 lb

## 2024-03-14 DIAGNOSIS — E063 Autoimmune thyroiditis: Secondary | ICD-10-CM

## 2024-03-14 LAB — TSH: TSH: 1.01 m[IU]/L

## 2024-03-14 LAB — T4, FREE: Free T4: 1.3 ng/dL (ref 0.8–1.8)

## 2024-03-14 LAB — T3, FREE: T3, Free: 3.2 pg/mL (ref 2.3–4.2)

## 2024-03-14 NOTE — Progress Notes (Unsigned)
 "   Name: Brenda Villegas  MRN/ DOB: [id number], Apr 02, 1990    Age/ Sex: 34 y.o., female    PCP: Lemon Raisin, MD   Reason for Endocrinology Evaluation: Hashimoto's Disease      Date of Initial Endocrinology Evaluation: 03/17/2022    HPI: [name] is a 34 y.o. female with a past medical history of Hashimoto's disease. The patient presented for initial endocrinology clinic visit on 03/17/2022 for consultative assistance with her Hashimoto's disease.   Patient has been diagnosed with Hashimoto's disease in 2016 with an elevated TPO antibody of 241 IU/mL.  This was after the delivery of her first son, she was noted with a suppressed TSH in 2016 which was a few months postpartum.  She was also noted to have an elevated TSH at 7.72 u IU/mL 09/2020 The patient required LT-4 replacement during her third pregnancy in 2023.  She discontinued levothyroxine  following pregnancy with normalization of her TFTs 4 months postpartum   S/P tubal ligation  Maternal grandma with thyroid  disease, cousin required   On her initial visit to our clinic she had normal TFTs, she was not on any levothyroxine  and remained off  Thyroid  ultrasound through PCPs office in June, 2025 revealed subcentimeter nodule that did not meet criteria for surveillance or FNA, which was related to chronic thyroiditis  SUBJECTIVE:    Today (03/14/24): [name] is here for follow-up on Hashimoto's thyroiditis.   Patient has been noticing weight loss over the past year The patient does complain of occasional tongue swelling, recent ANA negative Has occasional local neck swelling  No heartburn recently  Has palpitations with anxiety  No constipation or diarrhea but has soft stools since gallbladder removal    HISTORY:  Past Medical History:  Past Medical History:  Diagnosis Date   Allergy    Anemia    GERD (gastroesophageal reflux disease)    h/o with preganancy only   History of migraine    HPV  in female    Hyperthyroidism 08/07/2014   Supervision of high risk pregnancy, antepartum 06/28/2019   Clinic [facility] Prenatal Labs  Dating 6 week ultrasound Blood type: O/Negative/-- (05/20 1121)   Genetic Screen Declines AFP: [ ]  Antibody:Negative (10/27 1531) positive on 1/14- weak Anti D antibody noted.  Anatomic US  Normal female Rubella: 3.58 (05/20 1121)  Varicella: Immune  GTT   Third 103 trimester:  RPR: Non Reactive (10/27 1531)   Rhogam [x ] 28 weeks. Given 11/23 HBsAg: Negative (05/20 112   Past Surgical History:  Past Surgical History:  Procedure Laterality Date   CHOLECYSTECTOMY  04/2022   KNEE SURGERY Right 02/09/2004   LAPAROSCOPIC TUBAL LIGATION Bilateral 03/01/2022   Procedure: LAPAROSCOPIC FILSHIE CLIP PLACEMENT FOR PERMANENT STERILIZATION;  Surgeon: Janit Alm Agent, MD;  Location: [facility] ORS;  Service: Gynecology;  Laterality: Bilateral;  [id number]   TONSILLECTOMY     At age 67   TUBAL LIGATION     WISDOM TOOTH EXTRACTION     all four    Social History:  reports that she quit smoking about 2 years ago. Her smoking use included e-cigarettes. She has never used smokeless tobacco. She reports that she does not drink alcohol and does not use drugs. Family History: family history includes Bipolar disorder in her father; Breast cancer in her paternal aunt and another family member; Cancer in her maternal grandfather; Esophageal cancer in her maternal grandfather; Hyperlipidemia in her mother; Hypertension in her mother; Ovarian cysts in her mother; Pancreatic  cancer in an other family member; Thyroid  cancer in her maternal aunt; Thyroid  disease in her maternal grandmother and mother.   HOME MEDICATIONS: Allergies as of 03/14/2024       Reactions   Penicillins Hives        Medication List        Accurate as of March 14, 2024 10:29 AM. If you have any questions, ask your nurse or doctor.          hydrOXYzine  10 MG tablet Commonly known as: ATARAX  Take 1 tablet (10 mg  total) by mouth 3 (three) times daily as needed.   MULTIVITAMIN PO Take by mouth.   sertraline  25 MG tablet Commonly known as: ZOLOFT  Take 1 tablet (25 mg total) by mouth daily.          REVIEW OF SYSTEMS: A comprehensive ROS was conducted with the patient and is negative except as per HPI     OBJECTIVE:  VS: BP 118/80   Pulse (!) 112   Ht 5' 8 (1.727 m)   Wt 235 lb (106.6 kg)   SpO2 99%   BMI 35.73 kg/m    Wt Readings from Last 3 Encounters:  03/14/24 235 lb (106.6 kg)  03/02/24 232 lb 2 oz (105.3 kg)  01/20/24 240 lb (108.9 kg)     EXAM: General: Pt appears well and is in NAD  Neck: General: Supple without adenopathy. Thyroid : Thyroid  size normal.  No goiter or nodules appreciated.   Lungs: Clear with good BS bilat   Heart: Auscultation: RRR.  Abdomen: Soft, nontender  Extremities:  BL LE: No pretibial edema   Mental Status: Judgment, insight: Intact Orientation: Oriented to time, place, and person Mood and affect: No depression, anxiety, or agitation     DATA REVIEWED:  ****  Latest Reference Range & Units 12/06/23 10:34  TSH 0.450 - 4.500 uIU/mL 1.450  T4,Free(Direct) 0.82 - 1.77 ng/dL 8.72  Anti-TPO Ab (RDL) <9.0 IU/mL 169.8 (H)      Thyroid  Ultrasound 07/28/2023  Estimated total number of nodules >/= 1 cm: 0   Number of spongiform nodules >/=  2 cm not described below (TR1): 0   Number of mixed cystic and solid nodules >/= 1.5 cm not described below (TR2): 0   _________________________________________________________   0.7 cm solid echogenic RIGHT inferior thyroid  nodule (TI-RADS 3) does not meet criteria for imaging surveillance or FNA.   IMPRESSION: Moderate diffuse heterogeneity of the thyroid  parenchyma, most likely related to chronic thyroiditis.    ASSESSMENT/PLAN/RECOMMENDATIONS:   Hashimoto's Disease:  -Patient clinically euthyroid -No local neck symptoms -She required levothyroxine  50 mcg during her third pregnancy  in 2023.  She has not required any LT-4 replacement since delivery -She had thyroid  ultrasound through her PCPs office in 07/2023 subcentimeter nodule was noted, did not meet criteria for follow-up which was mostly attributed to chronic thyroiditis - Repeat TFTs****    Patient to follow-up with PCP with annual TFTs   Signed electronically by: Stefano Redgie Butts, MD  [facility] Endocr[facility]  [facility] [address] [place] [place], [place] [address] Phone: [phone] FAX: [phone]   CC: Lemon Raisin, MD [address] [place] [address] [address] Phone: [phone] Fax: [phone]   Return to Endocrinology clinic as below: Future Appointments  Date Time Provider Department Center  03/14/2024 10:30 AM Abhimanyu Cruces, Donell Redgie, MD [facility] None  04/09/2024  4:00 PM Lemon Raisin, MD [facility] [id number]  07/25/2024  9:40 AM Lemon Raisin, MD  [facility] [id number]          "

## 2024-03-14 NOTE — Patient Instructions (Signed)
 Please continue to follow-up with your primary care doctor  Thyroid  ultrasound shows a typical picture of Hashimoto's disease, no serial ultrasound or biopsy was recommended at the time.

## 2024-03-15 ENCOUNTER — Ambulatory Visit: Payer: Self-pay | Admitting: Internal Medicine

## 2024-03-23 ENCOUNTER — Ambulatory Visit: Payer: Medicaid Other | Admitting: Internal Medicine

## 2024-04-09 ENCOUNTER — Ambulatory Visit: Admitting: Student

## 2024-07-25 ENCOUNTER — Encounter: Admitting: Student
